# Patient Record
Sex: Female | Born: 1940
Health system: Southern US, Community
[De-identification: ages and names within clinical notes are randomized; demographics above are authoritative.]

## PROBLEM LIST (undated history)

## (undated) DIAGNOSIS — K760 Fatty (change of) liver, not elsewhere classified: Secondary | ICD-10-CM

## (undated) DIAGNOSIS — L409 Psoriasis, unspecified: Secondary | ICD-10-CM

## (undated) DIAGNOSIS — D229 Melanocytic nevi, unspecified: Secondary | ICD-10-CM

## (undated) DIAGNOSIS — I2699 Other pulmonary embolism without acute cor pulmonale: Secondary | ICD-10-CM

## (undated) DIAGNOSIS — N904 Leukoplakia of vulva: Secondary | ICD-10-CM

## (undated) DIAGNOSIS — E785 Hyperlipidemia, unspecified: Secondary | ICD-10-CM

## (undated) DIAGNOSIS — G4700 Insomnia, unspecified: Secondary | ICD-10-CM

## (undated) DIAGNOSIS — I82409 Acute embolism and thrombosis of unspecified deep veins of unspecified lower extremity: Secondary | ICD-10-CM

## (undated) DIAGNOSIS — F329 Major depressive disorder, single episode, unspecified: Secondary | ICD-10-CM

## (undated) DIAGNOSIS — E039 Hypothyroidism, unspecified: Secondary | ICD-10-CM

## (undated) DIAGNOSIS — L309 Dermatitis, unspecified: Secondary | ICD-10-CM

## (undated) DIAGNOSIS — E559 Vitamin D deficiency, unspecified: Secondary | ICD-10-CM

## (undated) DIAGNOSIS — E041 Nontoxic single thyroid nodule: Secondary | ICD-10-CM

## (undated) DIAGNOSIS — I10 Essential (primary) hypertension: Secondary | ICD-10-CM

## (undated) DIAGNOSIS — R7301 Impaired fasting glucose: Secondary | ICD-10-CM

## (undated) DIAGNOSIS — Z8371 Family history of colonic polyps: Secondary | ICD-10-CM

## (undated) DIAGNOSIS — M8580 Other specified disorders of bone density and structure, unspecified site: Secondary | ICD-10-CM

## (undated) DIAGNOSIS — Z973 Presence of spectacles and contact lenses: Secondary | ICD-10-CM

## (undated) DIAGNOSIS — Z83719 Family history of colon polyps, unspecified: Secondary | ICD-10-CM

## (undated) DIAGNOSIS — C4490 Unspecified malignant neoplasm of skin, unspecified: Secondary | ICD-10-CM

## (undated) DIAGNOSIS — C50919 Malignant neoplasm of unspecified site of unspecified female breast: Secondary | ICD-10-CM

## (undated) DIAGNOSIS — E079 Disorder of thyroid, unspecified: Secondary | ICD-10-CM

## (undated) DIAGNOSIS — C4491 Basal cell carcinoma of skin, unspecified: Secondary | ICD-10-CM

## (undated) HISTORY — DX: Melanocytic nevi, unspecified: D22.9

## (undated) HISTORY — DX: Major depressive disorder, single episode, unspecified: F32.9

## (undated) HISTORY — PX: COLONOSCOPY: SHX174

## (undated) HISTORY — DX: Leukoplakia of vulva: N90.4

## (undated) HISTORY — DX: Hyperlipidemia, unspecified: E78.5

## (undated) HISTORY — PX: TUBAL LIGATION: SHX77

## (undated) HISTORY — DX: Family history of colon polyps, unspecified: Z83.719

## (undated) HISTORY — DX: Insomnia, unspecified: G47.00

## (undated) HISTORY — DX: Basal cell carcinoma of skin, unspecified: C44.91

## (undated) HISTORY — DX: Nontoxic single thyroid nodule: E04.1

## (undated) HISTORY — DX: Malignant neoplasm of unspecified site of unspecified female breast: C50.919

## (undated) HISTORY — DX: Other specified disorders of bone density and structure, unspecified site: M85.80

## (undated) HISTORY — DX: Essential (primary) hypertension: I10

## (undated) HISTORY — DX: Acute embolism and thrombosis of unspecified deep veins of unspecified lower extremity: I82.409

## (undated) HISTORY — DX: Family history of colonic polyps: Z83.71

## (undated) HISTORY — DX: Disorder of thyroid, unspecified: E07.9

## (undated) HISTORY — DX: Impaired fasting glucose: R73.01

## (undated) HISTORY — DX: Dermatitis, unspecified: L30.9

## (undated) HISTORY — PX: BREAST SURGERY: SHX581

## (undated) HISTORY — DX: Vitamin D deficiency, unspecified: E55.9

## (undated) HISTORY — DX: Psoriasis, unspecified: L40.9

## (undated) HISTORY — DX: Unspecified malignant neoplasm of skin, unspecified: C44.90

## (undated) HISTORY — DX: Hypothyroidism, unspecified: E03.9

## (undated) HISTORY — DX: Other pulmonary embolism without acute cor pulmonale: I26.99

## (undated) HISTORY — DX: Fatty (change of) liver, not elsewhere classified: K76.0

---

## 1973-11-07 HISTORY — PX: WRIST FRACTURE SURGERY: SHX121

## 1996-11-07 HISTORY — PX: ANKLE FRACTURE SURGERY: SHX122

## 1996-11-07 HISTORY — PX: ABDOMINOPLASTY: SUR9

## 1997-11-07 DIAGNOSIS — I2699 Other pulmonary embolism without acute cor pulmonale: Secondary | ICD-10-CM

## 1997-11-07 HISTORY — DX: Other pulmonary embolism without acute cor pulmonale: I26.99

## 2000-08-25 ENCOUNTER — Encounter: Admission: RE | Admit: 2000-08-25 | Discharge: 2000-11-23 | Payer: Self-pay | Admitting: Family Medicine

## 2001-06-07 ENCOUNTER — Other Ambulatory Visit: Admission: RE | Admit: 2001-06-07 | Discharge: 2001-06-07 | Payer: Self-pay | Admitting: Family Medicine

## 2002-06-18 ENCOUNTER — Other Ambulatory Visit: Admission: RE | Admit: 2002-06-18 | Discharge: 2002-06-18 | Payer: Self-pay | Admitting: Family Medicine

## 2003-04-30 ENCOUNTER — Encounter: Admission: RE | Admit: 2003-04-30 | Discharge: 2003-07-29 | Payer: Self-pay | Admitting: Family Medicine

## 2003-06-23 ENCOUNTER — Other Ambulatory Visit: Admission: RE | Admit: 2003-06-23 | Discharge: 2003-06-23 | Payer: Self-pay | Admitting: Family Medicine

## 2003-12-04 ENCOUNTER — Encounter: Admission: RE | Admit: 2003-12-04 | Discharge: 2003-12-18 | Payer: Self-pay | Admitting: Family Medicine

## 2003-12-10 ENCOUNTER — Encounter: Admission: RE | Admit: 2003-12-10 | Discharge: 2004-03-09 | Payer: Self-pay | Admitting: Family Medicine

## 2004-01-29 ENCOUNTER — Encounter: Admission: RE | Admit: 2004-01-29 | Discharge: 2004-01-29 | Payer: Self-pay | Admitting: Family Medicine

## 2004-02-03 ENCOUNTER — Encounter: Admission: RE | Admit: 2004-02-03 | Discharge: 2004-02-03 | Payer: Self-pay | Admitting: Family Medicine

## 2004-02-12 ENCOUNTER — Other Ambulatory Visit: Admission: RE | Admit: 2004-02-12 | Discharge: 2004-02-12 | Payer: Self-pay | Admitting: Otolaryngology

## 2004-07-01 ENCOUNTER — Encounter: Admission: RE | Admit: 2004-07-01 | Discharge: 2004-07-01 | Payer: Self-pay | Admitting: Family Medicine

## 2004-08-19 ENCOUNTER — Encounter: Admission: RE | Admit: 2004-08-19 | Discharge: 2004-08-19 | Payer: Self-pay | Admitting: Otolaryngology

## 2004-08-19 ENCOUNTER — Other Ambulatory Visit: Admission: RE | Admit: 2004-08-19 | Discharge: 2004-08-19 | Payer: Self-pay | Admitting: Family Medicine

## 2005-01-13 ENCOUNTER — Ambulatory Visit (HOSPITAL_COMMUNITY): Admission: RE | Admit: 2005-01-13 | Discharge: 2005-01-13 | Payer: Self-pay | Admitting: Gastroenterology

## 2005-01-13 ENCOUNTER — Encounter (INDEPENDENT_AMBULATORY_CARE_PROVIDER_SITE_OTHER): Payer: Self-pay | Admitting: *Deleted

## 2005-08-23 ENCOUNTER — Other Ambulatory Visit: Admission: RE | Admit: 2005-08-23 | Discharge: 2005-08-23 | Payer: Self-pay | Admitting: Family Medicine

## 2005-09-21 ENCOUNTER — Encounter: Admission: RE | Admit: 2005-09-21 | Discharge: 2005-09-21 | Payer: Self-pay | Admitting: Family Medicine

## 2005-10-03 ENCOUNTER — Ambulatory Visit (HOSPITAL_COMMUNITY): Admission: RE | Admit: 2005-10-03 | Discharge: 2005-10-03 | Payer: Self-pay | Admitting: Otolaryngology

## 2006-09-22 ENCOUNTER — Encounter: Admission: RE | Admit: 2006-09-22 | Discharge: 2006-09-22 | Payer: Self-pay | Admitting: Family Medicine

## 2006-10-12 ENCOUNTER — Other Ambulatory Visit: Admission: RE | Admit: 2006-10-12 | Discharge: 2006-10-12 | Payer: Self-pay | Admitting: Family Medicine

## 2007-10-17 ENCOUNTER — Encounter: Admission: RE | Admit: 2007-10-17 | Discharge: 2007-10-17 | Payer: Self-pay | Admitting: Family Medicine

## 2008-10-21 ENCOUNTER — Encounter: Admission: RE | Admit: 2008-10-21 | Discharge: 2008-10-21 | Payer: Self-pay | Admitting: Family Medicine

## 2009-10-26 ENCOUNTER — Encounter: Admission: RE | Admit: 2009-10-26 | Discharge: 2009-10-26 | Payer: Self-pay | Admitting: Family Medicine

## 2009-12-04 ENCOUNTER — Encounter: Admission: RE | Admit: 2009-12-04 | Discharge: 2009-12-04 | Payer: Self-pay | Admitting: Family Medicine

## 2010-05-13 ENCOUNTER — Emergency Department (HOSPITAL_COMMUNITY): Admission: EM | Admit: 2010-05-13 | Discharge: 2010-05-13 | Payer: Self-pay | Admitting: Family Medicine

## 2010-10-27 ENCOUNTER — Encounter
Admission: RE | Admit: 2010-10-27 | Discharge: 2010-10-27 | Payer: Self-pay | Source: Home / Self Care | Attending: Family Medicine | Admitting: Family Medicine

## 2010-11-07 HISTORY — PX: EYE SURGERY: SHX253

## 2010-12-06 ENCOUNTER — Other Ambulatory Visit: Payer: Self-pay | Admitting: Family Medicine

## 2010-12-06 DIAGNOSIS — E041 Nontoxic single thyroid nodule: Secondary | ICD-10-CM

## 2010-12-09 ENCOUNTER — Ambulatory Visit
Admission: RE | Admit: 2010-12-09 | Discharge: 2010-12-09 | Disposition: A | Discharge status: Home / Self Care | Payer: MEDICARE | Source: Ambulatory Visit | Attending: Family Medicine | Admitting: Family Medicine

## 2010-12-09 DIAGNOSIS — E041 Nontoxic single thyroid nodule: Secondary | ICD-10-CM

## 2011-01-27 ENCOUNTER — Emergency Department (HOSPITAL_COMMUNITY): Payer: MEDICARE

## 2011-01-27 ENCOUNTER — Emergency Department (HOSPITAL_COMMUNITY)
Admission: EM | Admit: 2011-01-27 | Discharge: 2011-01-27 | Disposition: A | Discharge status: Home / Self Care | Payer: MEDICARE | Attending: Emergency Medicine | Admitting: Emergency Medicine

## 2011-01-27 DIAGNOSIS — E039 Hypothyroidism, unspecified: Secondary | ICD-10-CM | POA: Insufficient documentation

## 2011-01-27 DIAGNOSIS — M546 Pain in thoracic spine: Secondary | ICD-10-CM | POA: Insufficient documentation

## 2011-01-27 DIAGNOSIS — K219 Gastro-esophageal reflux disease without esophagitis: Secondary | ICD-10-CM | POA: Insufficient documentation

## 2011-01-27 DIAGNOSIS — F329 Major depressive disorder, single episode, unspecified: Secondary | ICD-10-CM | POA: Insufficient documentation

## 2011-01-27 DIAGNOSIS — F3289 Other specified depressive episodes: Secondary | ICD-10-CM | POA: Insufficient documentation

## 2011-01-27 DIAGNOSIS — R142 Eructation: Secondary | ICD-10-CM | POA: Insufficient documentation

## 2011-01-27 DIAGNOSIS — R6884 Jaw pain: Secondary | ICD-10-CM | POA: Insufficient documentation

## 2011-01-27 DIAGNOSIS — Z79899 Other long term (current) drug therapy: Secondary | ICD-10-CM | POA: Insufficient documentation

## 2011-01-27 DIAGNOSIS — R141 Gas pain: Secondary | ICD-10-CM | POA: Insufficient documentation

## 2011-01-27 DIAGNOSIS — R079 Chest pain, unspecified: Secondary | ICD-10-CM | POA: Insufficient documentation

## 2011-01-27 DIAGNOSIS — E785 Hyperlipidemia, unspecified: Secondary | ICD-10-CM | POA: Insufficient documentation

## 2011-01-27 DIAGNOSIS — F411 Generalized anxiety disorder: Secondary | ICD-10-CM | POA: Insufficient documentation

## 2011-01-27 DIAGNOSIS — I10 Essential (primary) hypertension: Secondary | ICD-10-CM | POA: Insufficient documentation

## 2011-01-27 DIAGNOSIS — M25519 Pain in unspecified shoulder: Secondary | ICD-10-CM | POA: Insufficient documentation

## 2011-01-27 LAB — COMPREHENSIVE METABOLIC PANEL
ALT: 89 U/L — ABNORMAL HIGH (ref 0–35)
AST: 64 U/L — ABNORMAL HIGH (ref 0–37)
Albumin: 4.3 g/dL (ref 3.5–5.2)
Alkaline Phosphatase: 68 U/L (ref 39–117)
BUN: 17 mg/dL (ref 6–23)
CO2: 26 mEq/L (ref 19–32)
Calcium: 9.2 mg/dL (ref 8.4–10.5)
Chloride: 108 mEq/L (ref 96–112)
Creatinine, Ser: 0.79 mg/dL (ref 0.4–1.2)
GFR calc Af Amer: >60 mL/min (ref >60)
GFR calc non Af Amer: >60 mL/min (ref >60)
Glucose, Bld: 108 mg/dL — ABNORMAL HIGH (ref 70–99)
Potassium: 4.4 mEq/L (ref 3.5–5.1)
Sodium: 141 mEq/L (ref 135–145)
Total Bilirubin: 0.2 mg/dL — ABNORMAL LOW (ref 0.3–1.2)
Total Protein: 7.3 g/dL (ref 6.0–8.3)

## 2011-01-27 LAB — DIFFERENTIAL
Basophils Absolute: 0.1 10*3/uL (ref 0.0–0.1)
Basophils Relative: 1 % (ref 0–1)
Eosinophils Absolute: 0.5 10*3/uL (ref 0.0–0.7)
Eosinophils Relative: 7 % — ABNORMAL HIGH (ref 0–5)
Lymphocytes Relative: 26 % (ref 12–46)
Lymphs Abs: 2.2 10*3/uL (ref 0.7–4.0)
Monocytes Absolute: 0.7 10*3/uL (ref 0.1–1.0)
Monocytes Relative: 8 % (ref 3–12)
Neutro Abs: 4.9 10*3/uL (ref 1.7–7.7)
Neutrophils Relative %: 59 % (ref 43–77)

## 2011-01-27 LAB — CBC
HCT: 40.4 % (ref 36.0–46.0)
Hemoglobin: 13.3 g/dL (ref 12.0–15.0)
MCH: 31.5 pg (ref 26.0–34.0)
MCHC: 32.9 g/dL (ref 30.0–36.0)
MCV: 95.7 fL (ref 78.0–100.0)
Platelets: 236 10*3/uL (ref 150–400)
RBC: 4.22 MIL/uL (ref 3.87–5.11)
RDW: 13.7 % (ref 11.5–15.5)
WBC: 8.3 10*3/uL (ref 4.0–10.5)

## 2011-01-27 LAB — POCT CARDIAC MARKERS
CKMB, poc: <1 ng/mL — ABNORMAL LOW (ref 1.0–8.0)
Myoglobin, poc: 52.2 ng/mL (ref 12–200)
Troponin i, poc: <0.05 ng/mL (ref 0.00–0.09)

## 2011-03-25 NOTE — Op Note (Signed)
NAME:  Jessica Bautista, Jessica Bautista NO.:  [id number]   MEDICAL RECORD NO.:  [id number]          PATIENT TYPE:  AMB   LOCATION:  ENDO                         FACILITY:  [facility]   PHYSICIAN:  John C. Madilyn Fireman, M.D.    DATE OF BIRTH:  08-Sep-1941   DATE OF PROCEDURE:  01/13/2005  DATE OF DISCHARGE:                                 OPERATIVE REPORT   PROCEDURE:  Colonoscopy.   ENDOSCOPIST:  Everardo All. Madilyn Fireman, M.D.   INDICATIONS FOR PROCEDURE:  Family history of colon cancer in first-degree  relatives.   PROCEDURE:  The patient placed in the left lateral decubitus position and  placed on pulse monitor with continuous low-flow oxygen delivered by nasal  cannula.  She was sedated with 100 mcg IV fentanyl and 8 milligrams IV  Versed.  The Olympus video colonoscope was inserted into the rectum and  advanced to the cecum, confirmed by transillumination of McBurney's point  and visualization of the ileocecal valve and appendiceal orifice. The prep  was excellent.  The cecum, ascending, transverse and the descending colon  all appeared normal with no masses, polyps, diverticula or other mucosal  abnormalities.  In the sigmoid colon, there was seen a 7- to 8-mm polyp at  28 cm which was removed by snare.  In the rectum, there was 5-mm polyp that  was fulgurated by hot biopsy.  The remainder of the rectum appeared normal.  There were 1 or 2 scattered diverticula in the area of the sigmoid colon.  Scope was then withdrawn and the patient returned to the recovery room in  stable condition.  She tolerated the procedure well and there were no  immediate complications.   IMPRESSION:  1.  Sigmoid and rectosigmoid polyps.  2.  Rare diverticula in the sigmoid colon.   PLAN:  Await histology and will repeat colonoscopy in 3-5 years.      [facility]/MEDQ  D:  01/13/2005  T:  01/13/2005  Job:  [id number]   cc:   Caryn Bee L. Little, M.D.  [address]  [place]  [place] [address]  Fax: [phone]

## 2011-09-23 ENCOUNTER — Other Ambulatory Visit: Payer: Self-pay | Admitting: Family Medicine

## 2011-09-23 DIAGNOSIS — Z1231 Encounter for screening mammogram for malignant neoplasm of breast: Secondary | ICD-10-CM

## 2011-11-04 ENCOUNTER — Ambulatory Visit
Admission: RE | Admit: 2011-11-04 | Discharge: 2011-11-04 | Disposition: A | Discharge status: Home / Self Care | Payer: Medicare Other | Source: Ambulatory Visit | Attending: Family Medicine | Admitting: Family Medicine

## 2011-11-04 DIAGNOSIS — Z1231 Encounter for screening mammogram for malignant neoplasm of breast: Secondary | ICD-10-CM

## 2012-05-16 ENCOUNTER — Other Ambulatory Visit: Payer: Self-pay | Admitting: Dermatology

## 2012-11-05 ENCOUNTER — Other Ambulatory Visit: Payer: Self-pay | Admitting: Family Medicine

## 2012-11-05 DIAGNOSIS — Z1231 Encounter for screening mammogram for malignant neoplasm of breast: Secondary | ICD-10-CM

## 2012-11-13 ENCOUNTER — Other Ambulatory Visit: Payer: Self-pay | Admitting: Family Medicine

## 2012-11-13 DIAGNOSIS — E041 Nontoxic single thyroid nodule: Secondary | ICD-10-CM

## 2012-11-15 ENCOUNTER — Other Ambulatory Visit: Payer: Medicare Other

## 2012-11-16 ENCOUNTER — Ambulatory Visit
Admission: RE | Admit: 2012-11-16 | Discharge: 2012-11-16 | Disposition: A | Discharge status: Home / Self Care | Payer: Medicare Other | Source: Ambulatory Visit | Attending: Family Medicine | Admitting: Family Medicine

## 2012-11-16 DIAGNOSIS — E041 Nontoxic single thyroid nodule: Secondary | ICD-10-CM

## 2012-12-06 ENCOUNTER — Ambulatory Visit
Admission: RE | Admit: 2012-12-06 | Discharge: 2012-12-06 | Disposition: A | Discharge status: Home / Self Care | Payer: Medicare Other | Source: Ambulatory Visit | Attending: Family Medicine | Admitting: Family Medicine

## 2012-12-06 DIAGNOSIS — Z1231 Encounter for screening mammogram for malignant neoplasm of breast: Secondary | ICD-10-CM

## 2013-05-23 ENCOUNTER — Other Ambulatory Visit: Payer: Self-pay | Admitting: Dermatology

## 2013-06-11 ENCOUNTER — Other Ambulatory Visit: Payer: Self-pay | Admitting: Dermatology

## 2013-11-05 ENCOUNTER — Other Ambulatory Visit: Payer: Self-pay

## 2013-11-05 DIAGNOSIS — Z1231 Encounter for screening mammogram for malignant neoplasm of breast: Secondary | ICD-10-CM

## 2013-11-14 ENCOUNTER — Other Ambulatory Visit: Payer: Self-pay | Admitting: Family Medicine

## 2013-11-14 DIAGNOSIS — E041 Nontoxic single thyroid nodule: Secondary | ICD-10-CM

## 2013-11-19 ENCOUNTER — Ambulatory Visit
Admission: RE | Admit: 2013-11-19 | Discharge: 2013-11-19 | Disposition: A | Discharge status: Home / Self Care | Payer: Medicare Other | Source: Ambulatory Visit | Attending: Family Medicine | Admitting: Family Medicine

## 2013-11-19 DIAGNOSIS — E041 Nontoxic single thyroid nodule: Secondary | ICD-10-CM

## 2013-12-10 ENCOUNTER — Ambulatory Visit
Admission: RE | Admit: 2013-12-10 | Discharge: 2013-12-10 | Disposition: A | Discharge status: Home / Self Care | Payer: Self-pay | Source: Ambulatory Visit

## 2013-12-10 DIAGNOSIS — Z1231 Encounter for screening mammogram for malignant neoplasm of breast: Secondary | ICD-10-CM

## 2013-12-12 ENCOUNTER — Other Ambulatory Visit: Payer: Self-pay | Admitting: Dermatology

## 2013-12-13 ENCOUNTER — Other Ambulatory Visit: Payer: Self-pay | Admitting: Family Medicine

## 2013-12-13 DIAGNOSIS — R928 Other abnormal and inconclusive findings on diagnostic imaging of breast: Secondary | ICD-10-CM

## 2013-12-25 ENCOUNTER — Other Ambulatory Visit: Payer: Medicare Other

## 2014-01-06 ENCOUNTER — Ambulatory Visit
Admission: RE | Admit: 2014-01-06 | Discharge: 2014-01-06 | Disposition: A | Discharge status: Home / Self Care | Payer: Medicare Other | Source: Ambulatory Visit | Attending: Family Medicine | Admitting: Family Medicine

## 2014-01-06 ENCOUNTER — Other Ambulatory Visit: Payer: Self-pay | Admitting: Family Medicine

## 2014-01-06 DIAGNOSIS — R928 Other abnormal and inconclusive findings on diagnostic imaging of breast: Secondary | ICD-10-CM

## 2014-01-08 ENCOUNTER — Other Ambulatory Visit: Payer: Self-pay | Admitting: Family Medicine

## 2014-01-08 ENCOUNTER — Ambulatory Visit
Admission: RE | Admit: 2014-01-08 | Discharge: 2014-01-08 | Disposition: A | Discharge status: Home / Self Care | Payer: Medicare Other | Source: Ambulatory Visit | Attending: Family Medicine | Admitting: Family Medicine

## 2014-01-08 DIAGNOSIS — R928 Other abnormal and inconclusive findings on diagnostic imaging of breast: Secondary | ICD-10-CM

## 2014-01-10 ENCOUNTER — Telehealth: Payer: Self-pay | Admitting: *Deleted

## 2014-01-10 DIAGNOSIS — Z170 Estrogen receptor positive status [ER+]: Secondary | ICD-10-CM

## 2014-01-10 DIAGNOSIS — C50411 Malignant neoplasm of upper-outer quadrant of right female breast: Secondary | ICD-10-CM

## 2014-01-10 NOTE — Telephone Encounter (Signed)
Confirmed [facility] for 01/15/14 at 12N .  Instructions and contact information given.

## 2014-01-15 ENCOUNTER — Ambulatory Visit (HOSPITAL_BASED_OUTPATIENT_CLINIC_OR_DEPARTMENT_OTHER): Payer: Medicare Other | Admitting: Oncology

## 2014-01-15 ENCOUNTER — Encounter: Payer: Self-pay | Admitting: Oncology

## 2014-01-15 ENCOUNTER — Ambulatory Visit: Payer: Medicare Other | Admitting: Physical Therapy

## 2014-01-15 ENCOUNTER — Telehealth: Payer: Self-pay | Admitting: Oncology

## 2014-01-15 ENCOUNTER — Other Ambulatory Visit (HOSPITAL_BASED_OUTPATIENT_CLINIC_OR_DEPARTMENT_OTHER): Payer: Medicare Other

## 2014-01-15 ENCOUNTER — Encounter: Payer: Self-pay | Admitting: Radiation Oncology

## 2014-01-15 ENCOUNTER — Ambulatory Visit (HOSPITAL_BASED_OUTPATIENT_CLINIC_OR_DEPARTMENT_OTHER): Payer: Medicare Other

## 2014-01-15 ENCOUNTER — Encounter (INDEPENDENT_AMBULATORY_CARE_PROVIDER_SITE_OTHER): Payer: Self-pay

## 2014-01-15 ENCOUNTER — Ambulatory Visit (HOSPITAL_BASED_OUTPATIENT_CLINIC_OR_DEPARTMENT_OTHER): Payer: Medicare Other | Admitting: General Surgery

## 2014-01-15 ENCOUNTER — Ambulatory Visit
Admission: RE | Admit: 2014-01-15 | Discharge: 2014-01-15 | Disposition: A | Discharge status: Home / Self Care | Payer: Medicare Other | Source: Ambulatory Visit | Attending: Radiation Oncology | Admitting: Radiation Oncology

## 2014-01-15 VITALS — BP 136/81 | HR 65 | Temp 98.2°F | Resp 20 | Ht 64.0 in | Wt 204.8 lb

## 2014-01-15 DIAGNOSIS — C50411 Malignant neoplasm of upper-outer quadrant of right female breast: Secondary | ICD-10-CM

## 2014-01-15 DIAGNOSIS — C50419 Malignant neoplasm of upper-outer quadrant of unspecified female breast: Secondary | ICD-10-CM

## 2014-01-15 DIAGNOSIS — Z170 Estrogen receptor positive status [ER+]: Secondary | ICD-10-CM

## 2014-01-15 LAB — COMPREHENSIVE METABOLIC PANEL (CC13)
ALT: 53 U/L (ref 0–55)
ANION GAP: 8 meq/L (ref 3–11)
AST: 40 U/L — AB (ref 5–34)
Albumin: 3.9 g/dL (ref 3.5–5.0)
Alkaline Phosphatase: 76 U/L (ref 40–150)
BUN: 11.4 mg/dL (ref 7.0–26.0)
CO2: 27 meq/L (ref 22–29)
CREATININE: 0.7 mg/dL (ref 0.6–1.1)
Calcium: 9.2 mg/dL (ref 8.4–10.4)
Chloride: 107 mEq/L (ref 98–109)
Glucose: 121 mg/dl (ref 70–140)
Potassium: 4.5 mEq/L (ref 3.5–5.1)
SODIUM: 142 meq/L (ref 136–145)
TOTAL PROTEIN: 6.8 g/dL (ref 6.4–8.3)
Total Bilirubin: 0.46 mg/dL (ref 0.20–1.20)

## 2014-01-15 LAB — CBC WITH DIFFERENTIAL/PLATELET
BASO%: 1.1 % (ref 0.0–2.0)
Basophils Absolute: 0.1 10*3/uL (ref 0.0–0.1)
EOS%: 5.4 % (ref 0.0–7.0)
Eosinophils Absolute: 0.4 10*3/uL (ref 0.0–0.5)
HCT: 37.5 % (ref 34.8–46.6)
HGB: 12.4 g/dL (ref 11.6–15.9)
LYMPH#: 1.9 10*3/uL (ref 0.9–3.3)
LYMPH%: 29 % (ref 14.0–49.7)
MCH: 31.4 pg (ref 25.1–34.0)
MCHC: 33.1 g/dL (ref 31.5–36.0)
MCV: 94.7 fL (ref 79.5–101.0)
MONO#: 0.5 10*3/uL (ref 0.1–0.9)
MONO%: 7.3 % (ref 0.0–14.0)
NEUT#: 3.7 10*3/uL (ref 1.5–6.5)
NEUT%: 57.2 % (ref 38.4–76.8)
Platelets: 243 10*3/uL (ref 145–400)
RBC: 3.96 10*6/uL (ref 3.70–5.45)
RDW: 13 % (ref 11.2–14.5)
WBC: 6.5 10*3/uL (ref 3.9–10.3)

## 2014-01-15 NOTE — Progress Notes (Signed)
Checked in new patient with no financial issues. She has appt card and I gave her [facility] packet. She has not been out of country.

## 2014-01-15 NOTE — Progress Notes (Signed)
Chief Complaint: New diagnosis of breast cancer  History:    Jessica Bautista is a 72 y.o. postmenopausal female referred by Dr. Eagle  for evaluation of recently diagnosed carcinoma of the right breast. She recently presented for a screening mamogram revealing a possible small mass in the right breast..  Subsequent imaging included diagnostic mamogram showing an irregular 8 mm mass in the lateral aspect of the right breast and ultrasound showing 6 mm irregular hypoechoic mass at the 9:30 position 6 cm from the nipple..   A ultrasound guided biopsy was performed on 01/08/2014 with pathology revealing invasive mammary carcinoma with papillary features.  She is seen now in multidisciplinary breast clinic for initial treatment planning.  She has experienced no breast symptoms specifically mass or nipple discharge or skin changes.  She does not have a personal history of any previous breast problems.  Findings at that time were the following:  Tumor size: 0.6 cm  Tumor grade: 2  Estrogen Receptor: positive Progesterone Receptor: positive  Her-2 neu: negative  Lymph node status: negative Neurovascular invasion: no Lymphatic invasion: no  Past Medical History  Diagnosis Date  . Hypertension   . Thyroid disease   . Skin cancer   . Hyperlipidemia   . Psoriasis   . Pulmonary embolus     Past Surgical History  Procedure Laterality Date  . Ankle fracture surgery    . Wrist fracture surgery    . Abdominoplasty      Current Outpatient Prescriptions  Medication Sig Dispense Refill  . escitalopram (LEXAPRO) 10 MG tablet Take 10 mg by mouth daily.       . lisinopril (PRINIVIL,ZESTRIL) 10 MG tablet Take 10 mg by mouth daily.      . pravastatin (PRAVACHOL) 40 MG tablet Take 20 mg by mouth daily.       . SYNTHROID 112 MCG tablet Take 112 mcg by mouth daily before breakfast.       . zolpidem (AMBIEN) 10 MG tablet 10 mg at bedtime as needed.        No current facility-administered medications for  this visit.    No family history on file.  History   Social History  . Marital Status: Married    Spouse Name: N/A    Number of Children: N/A  . Years of Education: N/A   Social History Main Topics  . Smoking status: Former Smoker -- 1.00 packs/day    Types: Cigarettes  . Smokeless tobacco: Not on file  . Alcohol Use: 1.8 oz/week    3 Glasses of wine per week  . Drug Use: No  . Sexual Activity: Not on file   Other Topics Concern  . Not on file   Social History Narrative  . No narrative on file     Review of Systems Constitutional: negative Respiratory: negative Cardiovascular: negative Gastrointestinal: negative, colonoscopy is up-to-date Hematologic/lymphatic: negative except for history of pulmonary embolus 18 years ago following an abdominoplasty     Objective:  There were no vitals taken for this visit.  General: Alert, moderately obese Caucasian female who appears younger than her stated age, in no distress Skin: Warm and dry without rash or infection. HEENT: No palpable masses or thyromegaly. Sclera nonicteric. Pupils equal round and reactive. Oropharynx clear. Breasts: slight bruising in thickening lateral right breast likely secondary to biopsy. No other palpable masses or skin change or nipple discharge in either breast Lymph nodes: No cervical, supraclavicular, or inguinal nodes palpable. Lungs: Breath sounds clear and   equal without increased work of breathing Cardiovascular: Regular rate and rhythm without murmur. No JVD or edema. Peripheral pulses intact. Abdomen: Nondistended. Soft and nontender. No masses palpable. No organomegaly. No palpable hernias. Extremities: No edema or joint swelling or deformity. No chronic venous stasis changes. Neurologic: Alert and fully oriented. Gait normal.   Laboratory data:  CBC:  Lab Results  Component Value Date   WBC 6.5 01/15/2014   WBC 8.3 01/27/2011   RBC 3.96 01/15/2014   RBC 4.22 01/27/2011   HGB 12.4  01/15/2014   HGB 13.3 01/27/2011   HCT 37.5 01/15/2014   HCT 40.4 01/27/2011   PLT 243 01/15/2014   PLT 236 01/27/2011  ]  [facility] Labs:  Lab Results  Component Value Date   NA 142 01/15/2014   NA 141 01/27/2011   K 4.5 01/15/2014   K 4.4 01/27/2011   CL 108 01/27/2011   CO2 27 01/15/2014   CO2 26 01/27/2011   BUN 11.4 01/15/2014   BUN 17 01/27/2011   CREATININE 0.7 01/15/2014   CREATININE 0.79 01/27/2011   CALCIUM 9.2 01/15/2014   CALCIUM 9.2 01/27/2011   PROT 6.8 01/15/2014   PROT 7.3 01/27/2011   BILITOT 0.46 01/15/2014   BILITOT 0.2* 01/27/2011   ALKPHOS 76 01/15/2014   ALKPHOS 68 01/27/2011   AST 40* 01/15/2014   AST 64* 01/27/2011   ALT 53 01/15/2014   ALT 89* 01/27/2011     Assessment  73 y.o. female with a new diagnosis of cancer of the the right breast upper outer quadrant.  Clinical IA, estrogen receptor positive, progesterone receptor positive and Her2/Neu protein/oncogene negative. I discussed with the patient and family members present today initial surgical treatment options. We discussed options of breast conservation with lumpectomy or total mastectomy and sentinal lymph node biopsy/dissection. Options for reconstruction were discussed. After discussion they have elected to proceed with right partial mastectomy.  After discussion with medical and radiation oncology and they feel that treatment would not be altered issue her lymph node positive. Current plan would be for postoperative hormonal treatment only. We discussed that sentinel lymph node biopsy would provide only prognostic information and not change her treatment and is very likely to be negative and she first sentinel lymph node biopsy which is our recommendation. We discussed the indications and nature of the procedure, and expected recovery, in detail. Surgical risks including anesthetic complications, cardiorespiratory complications, bleeding, infection, wound healing complications, blood clots, l  local and distant recurrence and  possible need for further surgery based on the final pathology was discussed and understood.  Chemotherapy, hormonal therapy and radiation therapy have been discussed. They have been provided with literature regarding the treatment of breast cancer.  All questions were answered. They understand and agree to proceed and we will go ahead with scheduling.  Plan right partial mastectomy needle localization as an outpatient under general anesthesia  Edward Jolly MD, FACS  01/15/2014, 3:47 PM

## 2014-01-15 NOTE — Progress Notes (Signed)
[place] Psychosocial Distress Screening Clinical Social Work  [place] representative Intern met Patient at breast clinic.  The patient scored a 4 on the Psychosocial Distress Thermometer which indicates mild distress. Clinical Social Worker Intern spoke with to assess for distress and other psychosocial needs. Patient was made aware of programs and services and provided with written information.  Patient had no further concerns but agrees to contact [facility] with further questions or concerns.   Clinical Social Worker follow up needed: no  If yes, follow up plan:   Kala Ambriz S. [place] Work Intern [employer] [phone]

## 2014-01-15 NOTE — Telephone Encounter (Signed)
, °

## 2014-01-15 NOTE — Progress Notes (Signed)
Jessica Bautista  [phone] [phone]     ID: Jessica Bautista OB: July 15, 1941  MR#: [id number]  KXF#:[id number]  PCP: Gennette Pac, MD GYN:   SU: Excell Seltzer OTHER MD: Eppie Gibson  CHIEF COMPLAINT: "My mammogram showed breast cancer"  BREAST CANCER HISTORY: Jessica Bautista had routine bilateral screening mammography at the breast Center to 01/24/2014. A possible mass in the right breast was noted, and she was recalled for a right diagnostic mammogram and ultrasound 01-2014. This showed an irregular 8 mm mass in the lateral right breast, with a second more circumscribed 7 mm mass inferiorly, which was stable. There was no palpable mass by exam. Ultrasound showed a 7 mm irregular hypoechoic mass in the right breast upper outer quadrant. The second area noted on mammography was a simple cyst. There was no right axillary adenopathy.  Biopsy of the questionable mass 01/08/2014 showed ([id number]) an invasive ductal carcinoma with papillary features, grade 2estrogen and progesterone receptor positive, with an MIB-1 of 15 and no HER-2 amplification, the signals ratio being 1.19 in the number per cell 1.55.   The patient's subsequent history is as detailed below  INTERVAL HISTORY: Jessica Bautista was evaluated in the multidisciplinary breast cancer clinic 01/15/2014. Her case was also discussed at conference that same morning.  REVIEW OF SYSTEMS: There were no specific symptoms leading to the original mammogram, which was routinely scheduled. The patient denies unusual headaches, visual changes, nausea, vomiting, stiff neck, dizziness, or gait imbalance. There has been no cough, phlegm production, or pleurisy, no chest pain or pressure, and no change in bowel or bladder habits. The patient denies fever, rash, bleeding, unexplained fatigue or unexplained weight loss. She does have a history of basal cell carcinomas, one recently removed by Dr. Sarajane Jews, as well as psoriasis,  chiefly involving the posterior scalp area. She has seasonal sinus allergies. Sometimes she feels forgetful, but denies anxiety or depression. A detailed review of systems was otherwise entirely negative.  PAST MEDICAL HISTORY: Past Medical History  Diagnosis Date  . Hypertension   . Thyroid disease   . Skin cancer   . Hyperlipidemia   . Psoriasis   . Pulmonary embolus     PAST SURGICAL HISTORY: Past Surgical History  Procedure Laterality Date  . Ankle fracture surgery    . Wrist fracture surgery    . Abdominoplasty      FAMILY HISTORY History reviewed. No pertinent family history. The patient's father died at the age of 4 with multiple comorbidities including diabetes heart disease and COPD. The patient's mother died at the age of 37. The patient had 3 brothers, one of whom died from lung cancer at the age of 44. He was a smoker. She had 2 sisters. There is no history of breast or ovarian cancer in the family.  GYNECOLOGIC HISTORY:  Menarche age 72, first live birth age 63, [name] P5. She stopped having periods "about 20 years ago". She did not take hormone replacement. 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 Her husband Jessica Bautista, is currently 6 years old. "He mostly stays at home and watch a sports". Son been lives in [place] and is in pacemaker sales. Daughter Jessica Bautista lives in [place] where she works for a Herbalist areas. Daughter Jessica Bautista lives in [place] and is an Glass blower/designer. Son Jessica Bautista lives in [place]  and is a Probation officer. Son Jessica Bautista also lives in [place], and works in plumbing. The patient has multiple grandchildren. She attends [employer]    ADVANCED DIRECTIVES: In place   HEALTH MAINTENANCE: History  Substance Use Topics  . Smoking status: Former Smoker -- 1.00 packs/day    Types: Cigarettes  .  Smokeless tobacco: Not on file  . Alcohol Use: 1.8 oz/week    3 Glasses of wine per week     Colonoscopy:  PAP:  Bone density: 11/18/2013 at [facility], lowest T score of -0.7 (normal) at the right femoral neck  Lipid panel:  No Known Allergies  No current outpatient prescriptions on file.   No current facility-administered medications for this visit.    OBJECTIVE: Middle-aged white woman in no acute distress Filed Vitals:   01/15/14 1251  BP: 136/81  Pulse: 65  Temp: 98.2 F (36.8 C)  Resp: 20     Body mass index is 35.14 kg/(m^2).    ECOG FS:1 - Symptomatic but completely ambulatory  Ocular: Sclerae unicteric, pupils equal, round and reactive to light Ear-nose-throat: Oropharynx clear, dentition in good repair Lymphatic: No cervical or supraclavicular adenopathy Lungs no rales or rhonchi, good excursion bilaterally Heart regular rate and rhythm, no murmur appreciated Abd soft, nontender, positive bowel sounds MSK no focal spinal tenderness, no joint edema Neuro: non-focal, well-oriented, appropriate affect Breasts: The right breast is status post recent biopsy. There is a moderate ecchymosis associated with this. There is no palpable mass. There is no skin or nipple change of concern. The right axilla is benign. The left breast is unremarkable   LAB RESULTS:  CMP     Component Value Date/Time   NA 142 01/15/2014 1221   NA 141 01/27/2011 1923   K 4.5 01/15/2014 1221   K 4.4 01/27/2011 1923   CL 108 01/27/2011 1923   CO2 27 01/15/2014 1221   CO2 26 01/27/2011 1923   GLUCOSE 121 01/15/2014 1221   GLUCOSE 108* 01/27/2011 1923   BUN 11.4 01/15/2014 1221   BUN 17 01/27/2011 1923   CREATININE 0.7 01/15/2014 1221   CREATININE 0.79 01/27/2011 1923   CALCIUM 9.2 01/15/2014 1221   CALCIUM 9.2 01/27/2011 1923   PROT 6.8 01/15/2014 1221   PROT 7.3 01/27/2011 1923   ALBUMIN 3.9 01/15/2014 1221   ALBUMIN 4.3 01/27/2011 1923   AST 40* 01/15/2014 1221   AST 64* 01/27/2011 1923   ALT  53 01/15/2014 1221   ALT 89* 01/27/2011 1923   ALKPHOS 76 01/15/2014 1221   ALKPHOS 68 01/27/2011 1923   BILITOT 0.46 01/15/2014 1221   BILITOT 0.2* 01/27/2011 1923   GFRNONAA >60 01/27/2011 1923   GFRAA  Value: >60        The eGFR has been calculated using the MDRD equation. This calculation has not been validated in all clinical situations. eGFR's persistently <60 mL/min signify possible Chronic Kidney Disease. 01/27/2011 1923    I No results found for this basename: SPEP, UPEP,  kappa and lambda light chains    Lab Results  Component Value Date   WBC 6.5 01/15/2014   NEUTROABS 3.7 01/15/2014   HGB 12.4 01/15/2014   HCT 37.5 01/15/2014   MCV 94.7 01/15/2014   PLT 243 01/15/2014      Chemistry      Component Value Date/Time   NA 142 01/15/2014 1221   NA 141 01/27/2011 1923   K 4.5 01/15/2014 1221   K 4.4 01/27/2011 1923  CL 108 01/27/2011 1923   CO2 27 01/15/2014 1221   CO2 26 01/27/2011 1923   BUN 11.4 01/15/2014 1221   BUN 17 01/27/2011 1923   CREATININE 0.7 01/15/2014 1221   CREATININE 0.79 01/27/2011 1923      Component Value Date/Time   CALCIUM 9.2 01/15/2014 1221   CALCIUM 9.2 01/27/2011 1923   ALKPHOS 76 01/15/2014 1221   ALKPHOS 68 01/27/2011 1923   AST 40* 01/15/2014 1221   AST 64* 01/27/2011 1923   ALT 53 01/15/2014 1221   ALT 89* 01/27/2011 1923   BILITOT 0.46 01/15/2014 1221   BILITOT 0.2* 01/27/2011 1923       No results found for this basename: LABCA2    No components found with this basename: LABCA125    No results found for this basename: INR,  in the last 168 hours  Urinalysis No results found for this basename: colorurine, appearanceur, labspec, phurine, glucoseu, hgbur, bilirubinur, ketonesur, proteinur, urobilinogen, nitrite, leukocytesur    STUDIES: Mm Digital Diagnostic Unilat R  01/08/2014   CLINICAL DATA:  Ultrasound-guided core needle biopsy of a mass at 9:30 o'clock, 7 cm from the right nipple with clip placement.  EXAM: POST-BIOPSY CLIP PLACEMENT RIGHT  DIAGNOSTIC MAMMOGRAM  COMPARISON:  Previous exams.  FINDINGS: Films are performed following ultrasound guided biopsy of a mass at 9:30 o'clock 7 cm from the right nipple. The ribbon clip is in the expected location of the nodule. The mass is obscured by a small hematoma.  IMPRESSION: Appropriate clip placement following ultrasound-guided core needle biopsy of a mass at 9:30 o'clock, 7 cm from the right nipple. The mass is obscured by a small hematoma.  Final Assessment: Post Procedure Mammograms for Marker Placement   Electronically Signed   By: Ulyess Blossom M.D.   On: 01/08/2014 10:42   Mm Digital Diagnostic Unilat R  01/06/2014   CLINICAL DATA:  Patient recalled from screening for right breast mass.  EXAM: DIGITAL DIAGNOSTIC  RIGHT MAMMOGRAM  ULTRASOUND RIGHT BREAST  COMPARISON:  [facility]  ACR Breast Density Category b: There are scattered areas of fibroglandular density.  FINDINGS: Spot compression CC and MLO views demonstrate a persistent irregular 8 mm mass within the lateral aspect of the right breast middle to posterior depth. Slightly inferior lateral to this irregular mass is an adjacent circumscribed 7 mm mass which is stable compared to multiple priors.  On physical exam, left palpate no discrete mass within the lateral aspect of the right breast.  Ultrasound is performed, showing a 0.6 x 0.7 x 0.4 cm irregular hypoechoic mass within the right breast 930 o'clock 6 cm from the nipple. Additionally there is a simple cyst within the right breast 9 o'clock position 7 cm from the nipple. No right axillary lymphadenopathy.  IMPRESSION: Suspicious mass within the right breast 930 o'clock. Ultrasound-guided core needle biopsy is recommended.  RECOMMENDATION: Ultrasound-guided core needle biopsy suspicious right breast mass. This is scheduled for 01/08/2014 at 9 a.m.  I have discussed the findings and recommendations with the patient. Results were also provided in writing at the conclusion of the visit. If  applicable, a reminder letter will be sent to the patient regarding the next appointment.  BI-RADS CATEGORY  4: Suspicious abnormality - biopsy should be considered.   Electronically Signed   By: Lovey Newcomer M.D.   On: 01/06/2014 11:52   US Breast Ltd Uni Right Inc Axilla  01/06/2014   CLINICAL DATA:  Patient recalled from screening for right breast mass.  EXAM: DIGITAL DIAGNOSTIC  RIGHT MAMMOGRAM  ULTRASOUND RIGHT BREAST  COMPARISON:  [facility]  ACR Breast Density Category b: There are scattered areas of fibroglandular density.  FINDINGS: Spot compression CC and MLO views demonstrate a persistent irregular 8 mm mass within the lateral aspect of the right breast middle to posterior depth. Slightly inferior lateral to this irregular mass is an adjacent circumscribed 7 mm mass which is stable compared to multiple priors.  On physical exam, left palpate no discrete mass within the lateral aspect of the right breast.  Ultrasound is performed, showing a 0.6 x 0.7 x 0.4 cm irregular hypoechoic mass within the right breast 930 o'clock 6 cm from the nipple. Additionally there is a simple cyst within the right breast 9 o'clock position 7 cm from the nipple. No right axillary lymphadenopathy.  IMPRESSION: Suspicious mass within the right breast 930 o'clock. Ultrasound-guided core needle biopsy is recommended.  RECOMMENDATION: Ultrasound-guided core needle biopsy suspicious right breast mass. This is scheduled for 01/08/2014 at 9 a.m.  I have discussed the findings and recommendations with the patient. Results were also provided in writing at the conclusion of the visit. If applicable, a reminder letter will be sent to the patient regarding the next appointment.  BI-RADS CATEGORY  4: Suspicious abnormality - biopsy should be considered.   Electronically Signed   By: Lovey Newcomer M.D.   On: 01/06/2014 11:52   [place] Rt Breast Bx W Loc Dev 1st Lesion Img Bx Spec US Guide  01/09/2014   ADDENDUM REPORT: 01/09/2014 11:42  ADDENDUM:  Pathologic results indicate invasive mammary carcinoma with papillary features. This is concordant with the imaging findings. The patient has a pending appointment with the multidisciplinary Clinic on January 15, 2014. I gave these results and recommendations to the patient by telephone at 11:30 a.m. on 01/09/2014. The patient expressed moderate tenderness with no significant bruising related to the biopsy.   Electronically Signed   By: Skipper Cliche M.D.   On: 01/09/2014 11:42   01/09/2014   CLINICAL DATA:  Oval mass at 9:30 o'clock, 7 cm from the right nipple.  EXAM: ULTRASOUND GUIDED RIGHT BREAST CORE NEEDLE BIOPSY WITH VACUUM ASSISTANCE  COMPARISON:  Previous exams.  PROCEDURE: I met with the patient and we discussed the procedure of ultrasound-guided biopsy, including benefits and alternatives. We discussed the high likelihood of a successful procedure. We discussed the risks of the procedure including infection, bleeding, tissue injury, clip migration, and inadequate sampling. Informed written consent was given. The usual time-out protocol was performed immediately prior to the procedure.  Using sterile technique, 2% Lidocaine as local anesthetic, and direct ultrasound visualization, a 12 gauge vacuum-assisteddevice was used to perform core needle biopsy of the mass at 9:30 o'clock 7 cm from the right nipple using a caudocranial approach. At the conclusion of the procedure, a ribbon tissue marker clip was deployed into the biopsy cavity. Follow-up 2-view mammogram was performed and dictated separately.  IMPRESSION: Ultrasound-guided biopsy of an oval mass at 9:30 o'clock, 7 cm from the right nipple. No apparent complications.  Electronically Signed: By: Ulyess Blossom M.D. On: 01/08/2014 10:09    ASSESSMENT: 73 y.o. [place] woman status post right breast biopsy 01/08/2014 for a clinical T1b N0, stage IA invasive ductal carcinoma, grade 2, estrogen and progesterone receptor positive, with an MIB-1 of  15% and no HER-2 amplification  PLAN: We spent the better part of today's hour-long appointment discussing the biology of breast cancer in general, and the specifics of the  patient's tumor in particular. Jessica Bautista is in good shape for a woman of 62, but nevertheless in her situation and the benefit of chemotherapy, even if she had a positive lymph node, would be so low that I would be comfortable foregoing chemotherapy in her case. For that reason I do not feel she needs a sentinel lymph node or an Oncotype.  We also discussed radiation issues. We went over the data that shows that women in her situation who've takeanti-estrogens can safely forego radiation without any compromise and survival. There is only a minimal increase in the risk of local recurrence.  Once she understood that data she felt comfortable with no sentinel lymph node sampling (which minimizes the risk of lymphedema) and likely no post lumpectomy radiation assuming we get good margins from her surgery.   We then discussed the possible toxicities, side effects and complications of anti-estrogens. She understands it may take several months for her to develop significant side effects which might lead her to eventually not to take anti-estrogens, and if 5 or 6 months pass postop and she then decides not to take anti-estrogens it will be "too late" for radiation and likely we would simply proceed to observation from that point.  However she is quite committed to take anti-estrogens and very likely we will start with anastrozole, given that she has a normal baseline bone density. She is going to see me in approximately one month at which time we will review the final pathology from her surgery and make a final decision regarding radiation and antiestrogen therapy.  Jessica Bautista has a good understanding of this plan, and agrees with it. She knows the goal of treatment in her cases cure. She will call with any problems that may develop before next visit  here.   Chauncey Cruel, MD   01/15/2014 3:07 PM

## 2014-01-15 NOTE — Progress Notes (Signed)
Radiation Oncology         [phone] ________________________________  Initial outpatient Consultation  Name: SHAMEKIA TIPPETS MRN: [id number]  Date: 01/15/2014  DOB: Feb 14, 1941  PJ:SUNHRV,ACQPE Juel Burrow, MD  Hoxworth, Lorne Skeens, MD   REFERRING PHYSICIAN: Mariella Saa, MD  DIAGNOSIS: [id number] Right breast Invasive Mammary Carcinoma, ER 100%/PR 100% +, Her2(-), Ki67 19%, Grade 2  HISTORY OF PRESENT ILLNESS::Astin Kathie Rhodes Laury is a 73 y.o. female who presented with a right breast mass on screening mammography.   This was in the 9:30 position on [place] and measured 31mm.  Biopsy revealed Invasive Mammary Carcinoma, ER 100%/PR 100% +, Her2(-), Ki67 19%, Grade 2.    She is otherwise in her usual state of health.  She had Mohs surgery for skin cancer of her face this morning.   PREVIOUS RADIATION THERAPY: No  PAST MEDICAL HISTORY:  has a past medical history of Hypertension; Thyroid disease; Skin cancer; Hyperlipidemia; Psoriasis; and Pulmonary embolus.    PAST SURGICAL HISTORY: Past Surgical History  Procedure Laterality Date  . Ankle fracture surgery    . Wrist fracture surgery    . Abdominoplasty      FAMILY HISTORY: family history is not on file.  SOCIAL HISTORY:  reports that she has quit smoking. Her smoking use included Cigarettes. She smoked 1.00 pack per day. She does not have any smokeless tobacco history on file. She reports that she drinks about 1.8 ounces of alcohol per week. She reports that she does not use illicit drugs.  ALLERGIES: Review of patient's allergies indicates no known allergies.  MEDICATIONS:  Current Outpatient Prescriptions  Medication Sig Dispense Refill  . escitalopram (LEXAPRO) 10 MG tablet Take 10 mg by mouth daily.       Marland Kitchen lisinopril (PRINIVIL,ZESTRIL) 10 MG tablet Take 10 mg by mouth daily.      . pravastatin (PRAVACHOL) 40 MG tablet Take 20 mg by mouth daily.       Marland Kitchen SYNTHROID 112 MCG tablet Take 112 mcg by mouth daily before breakfast.         . zolpidem (AMBIEN) 10 MG tablet 10 mg at bedtime as needed.        No current facility-administered medications for this encounter.    REVIEW OF SYSTEMS:  Notable for that above.   PHYSICAL EXAM Vitals with Age-Percentiles 01/15/2014  Length 162.6 cm  Systolic 136  Diastolic 81  Pulse 65  Respiration 20  Weight 92.897 kg  BMI 35.2  VISIT REPORT     General: Alert and oriented, in no acute distress. Well-appearing. Neck: Neck is supple, no palpable cervical or supraclavicular lymphadenopathy. Heart: Regular in rate and rhythm with no murmurs, rubs, or gallops. Chest: Clear to auscultation bilaterally, with no rhonchi, wheezes, or rales. Abdomen: Soft, nontender, nondistended, with no rigidity or guarding. Lymphatics: No concerning lymphadenopathy. Skin: bandage over forehead from Mohs Neurologic: Cranial nerves II through XII are grossly intact. No obvious focalities. Speech is fluent. Coordination is intact. Psychiatric: Judgment and insight are intact. Affect is appropriate. Breasts: post biopsy tenderness/ecchymoses in right breast, otherwise unremarkable breast exam bilaterally without any palpable axillary nodes  ECOG = 0  0 - Asymptomatic (Fully active, able to carry on all predisease activities without restriction)  1 - Symptomatic but completely ambulatory (Restricted in physically strenuous activity but ambulatory and able to carry out work of a light or sedentary nature. For example, light housework, office work)  2 - Symptomatic, <50% in bed during the day (Ambulatory and  capable of all self care but unable to carry out any work activities. Up and about more than 50% of waking hours)  3 - Symptomatic, >50% in bed, but not bedbound (Capable of only limited self-care, confined to bed or chair 50% or more of waking hours)  4 - Bedbound (Completely disabled. Cannot carry on any self-care. Totally confined to bed or chair)  5 - Death   Santiago Glad MM, Creech RH, Tormey DC,  et al. [phone]). "Toxicity and response criteria of the [facility] Group". Am. Evlyn Clines. Oncol. 5 (6): 649-55   LABORATORY DATA:  Lab Results  Component Value Date   WBC 6.5 01/15/2014   HGB 12.4 01/15/2014   HCT 37.5 01/15/2014   MCV 94.7 01/15/2014   PLT 243 01/15/2014   CMP     Component Value Date/Time   NA 142 01/15/2014 1221   NA 141 01/27/2011 1923   K 4.5 01/15/2014 1221   K 4.4 01/27/2011 1923   CL 108 01/27/2011 1923   CO2 27 01/15/2014 1221   CO2 26 01/27/2011 1923   GLUCOSE 121 01/15/2014 1221   GLUCOSE 108* 01/27/2011 1923   BUN 11.4 01/15/2014 1221   BUN 17 01/27/2011 1923   CREATININE 0.7 01/15/2014 1221   CREATININE 0.79 01/27/2011 1923   CALCIUM 9.2 01/15/2014 1221   CALCIUM 9.2 01/27/2011 1923   PROT 6.8 01/15/2014 1221   PROT 7.3 01/27/2011 1923   ALBUMIN 3.9 01/15/2014 1221   ALBUMIN 4.3 01/27/2011 1923   AST 40* 01/15/2014 1221   AST 64* 01/27/2011 1923   ALT 53 01/15/2014 1221   ALT 89* 01/27/2011 1923   ALKPHOS 76 01/15/2014 1221   ALKPHOS 68 01/27/2011 1923   BILITOT 0.46 01/15/2014 1221   BILITOT 0.2* 01/27/2011 1923   GFRNONAA >60 01/27/2011 1923   GFRAA  Value: >60        The eGFR has been calculated using the MDRD equation. This calculation has not been validated in all clinical situations. eGFR's persistently <60 mL/min signify possible Chronic Kidney Disease. 01/27/2011 1923         RADIOGRAPHY: Mm Digital Diagnostic Unilat R  01/08/2014   CLINICAL DATA:  Ultrasound-guided core needle biopsy of a mass at 9:30 o'clock, 7 cm from the right nipple with clip placement.  EXAM: POST-BIOPSY CLIP PLACEMENT RIGHT DIAGNOSTIC MAMMOGRAM  COMPARISON:  Previous exams.  FINDINGS: Films are performed following ultrasound guided biopsy of a mass at 9:30 o'clock 7 cm from the right nipple. The ribbon clip is in the expected location of the nodule. The mass is obscured by a small hematoma.  IMPRESSION: Appropriate clip placement following ultrasound-guided core needle biopsy  of a mass at 9:30 o'clock, 7 cm from the right nipple. The mass is obscured by a small hematoma.  Final Assessment: Post Procedure Mammograms for Marker Placement   Electronically Signed   By: Cain Saupe M.D.   On: 01/08/2014 10:42   Mm Digital Diagnostic Unilat R  01/06/2014   CLINICAL DATA:  Patient recalled from screening for right breast mass.  EXAM: DIGITAL DIAGNOSTIC  RIGHT MAMMOGRAM  ULTRASOUND RIGHT BREAST  COMPARISON:  [facility]  ACR Breast Density Category b: There are scattered areas of fibroglandular density.  FINDINGS: Spot compression CC and MLO views demonstrate a persistent irregular 8 mm mass within the lateral aspect of the right breast middle to posterior depth. Slightly inferior lateral to this irregular mass is an adjacent circumscribed 7 mm mass which is stable compared to  multiple priors.  On physical exam, left palpate no discrete mass within the lateral aspect of the right breast.  Ultrasound is performed, showing a 0.6 x 0.7 x 0.4 cm irregular hypoechoic mass within the right breast 930 o'clock 6 cm from the nipple. Additionally there is a simple cyst within the right breast 9 o'clock position 7 cm from the nipple. No right axillary lymphadenopathy.  IMPRESSION: Suspicious mass within the right breast 930 o'clock. Ultrasound-guided core needle biopsy is recommended.  RECOMMENDATION: Ultrasound-guided core needle biopsy suspicious right breast mass. This is scheduled for 01/08/2014 at 9 a.m.  I have discussed the findings and recommendations with the patient. Results were also provided in writing at the conclusion of the visit. If applicable, a reminder letter will be sent to the patient regarding the next appointment.  BI-RADS CATEGORY  4: Suspicious abnormality - biopsy should be considered.   Electronically Signed   By: Lovey Newcomer M.D.   On: 01/06/2014 11:52   US Breast Ltd Uni Right Inc Axilla  01/06/2014   CLINICAL DATA:  Patient recalled from screening for right breast mass.   EXAM: DIGITAL DIAGNOSTIC  RIGHT MAMMOGRAM  ULTRASOUND RIGHT BREAST  COMPARISON:  [facility]  ACR Breast Density Category b: There are scattered areas of fibroglandular density.  FINDINGS: Spot compression CC and MLO views demonstrate a persistent irregular 8 mm mass within the lateral aspect of the right breast middle to posterior depth. Slightly inferior lateral to this irregular mass is an adjacent circumscribed 7 mm mass which is stable compared to multiple priors.  On physical exam, left palpate no discrete mass within the lateral aspect of the right breast.  Ultrasound is performed, showing a 0.6 x 0.7 x 0.4 cm irregular hypoechoic mass within the right breast 930 o'clock 6 cm from the nipple. Additionally there is a simple cyst within the right breast 9 o'clock position 7 cm from the nipple. No right axillary lymphadenopathy.  IMPRESSION: Suspicious mass within the right breast 930 o'clock. Ultrasound-guided core needle biopsy is recommended.  RECOMMENDATION: Ultrasound-guided core needle biopsy suspicious right breast mass. This is scheduled for 01/08/2014 at 9 a.m.  I have discussed the findings and recommendations with the patient. Results were also provided in writing at the conclusion of the visit. If applicable, a reminder letter will be sent to the patient regarding the next appointment.  BI-RADS CATEGORY  4: Suspicious abnormality - biopsy should be considered.   Electronically Signed   By: Lovey Newcomer M.D.   On: 01/06/2014 11:52   [place] Rt Breast Bx W Loc Dev 1st Lesion Img Bx Spec US Guide  01/09/2014   ADDENDUM REPORT: 01/09/2014 11:42  ADDENDUM: Pathologic results indicate invasive mammary carcinoma with papillary features. This is concordant with the imaging findings. The patient has a pending appointment with the multidisciplinary Clinic on January 15, 2014. I gave these results and recommendations to the patient by telephone at 11:30 a.m. on 01/09/2014. The patient expressed moderate tenderness with no  significant bruising related to the biopsy.   Electronically Signed   By: Skipper Cliche M.D.   On: 01/09/2014 11:42   01/09/2014   CLINICAL DATA:  Oval mass at 9:30 o'clock, 7 cm from the right nipple.  EXAM: ULTRASOUND GUIDED RIGHT BREAST CORE NEEDLE BIOPSY WITH VACUUM ASSISTANCE  COMPARISON:  Previous exams.  PROCEDURE: I met with the patient and we discussed the procedure of ultrasound-guided biopsy, including benefits and alternatives. We discussed the high likelihood of a successful procedure. We  discussed the risks of the procedure including infection, bleeding, tissue injury, clip migration, and inadequate sampling. Informed written consent was given. The usual time-out protocol was performed immediately prior to the procedure.  Using sterile technique, 2% Lidocaine as local anesthetic, and direct ultrasound visualization, a 12 gauge vacuum-assisteddevice was used to perform core needle biopsy of the mass at 9:30 o'clock 7 cm from the right nipple using a caudocranial approach. At the conclusion of the procedure, a ribbon tissue marker clip was deployed into the biopsy cavity. Follow-up 2-view mammogram was performed and dictated separately.  IMPRESSION: Ultrasound-guided biopsy of an oval mass at 9:30 o'clock, 7 cm from the right nipple. No apparent complications.  Electronically Signed: By: Ulyess Blossom M.D. On: 01/08/2014 10:09      IMPRESSION/PLAN: She has been discussed at our multidisciplinary tumor board.  The consensus is that she be a good candidate for breast conservation. I talked to her about the option of a mastectomy and informed her that her expected overall survival would be equivalent between mastectomy and breast conservation, based upon randomized controlled data. She is enthusiastic about breast conservation.  For the patient's early stage favorable risk breast cancer, we had a thorough discussion about her options for adjuvant therapy. One option would be antiestrogen therapy  as discussed with medical oncology. She would take a pill for approximately 5 years. The alternative option (but less standard) would be radiotherapy to the breast. The most aggressive option would be to pursue both modalities.  Of note, I discussed the data from the [employer] al trial in the [employer]. She understands that tamoxifen compared to radiation plus tamoxifen demonstrated no survival benefit among the women in this study. The women were 74 years or older with stage I estrogen receptor positive breast cancer. Based on this study, I told Ms. Moragne that her overall life expectancy should not be affected by adding radiotherapy to antiestrogen medication. She understands that the main benefit of  adding radiotherapy to anti estrogen therapy would be a very small but measurable local control benefit (risk of local recurrence to be lowered from ~9% --> ~2% over a decade).  We discussed the fact that radiotherapy only provides a local control benefit wall estrogen provide systemic coverage. That being said, the risk of systemic failure is relatively low with her type of breast cancer.  She would like to think about the side effects of antiestrogen therapy and radiation therapy before making her decision. She has some reservations about whether should be able to tolerate an antiestrogen pill for 5 years and is considering radiation as a backup in case she cannot tolerate the antiestrogen pills very well.  We discussed the risks benefits and side effects of radiotherapy. She understands that the side effects would likely include some skin irritation and fatigue during the weeks of radiation. There is a risk of late effects which include but are not necessarily limited to cosmetic changes and rare lung toxicity. I would anticipate delivering approximately 5 weeks of radiotherapy: she would probably receive 50 gray in 25 fractions as I do not think her anatomy would be conducive to  hypofractionation.   After a thorough discussion, the patient would like to think about her options.  I asked our patient navigator to schedule a post-op followup with me so we can review her pathology and discuss her options further.    __________________________________________   Eppie Gibson, MD

## 2014-01-16 ENCOUNTER — Encounter: Payer: Self-pay | Admitting: *Deleted

## 2014-01-17 ENCOUNTER — Other Ambulatory Visit (INDEPENDENT_AMBULATORY_CARE_PROVIDER_SITE_OTHER): Payer: Self-pay | Admitting: General Surgery

## 2014-01-17 DIAGNOSIS — C50411 Malignant neoplasm of upper-outer quadrant of right female breast: Secondary | ICD-10-CM

## 2014-01-20 ENCOUNTER — Encounter: Payer: Self-pay | Admitting: *Deleted

## 2014-01-20 ENCOUNTER — Other Ambulatory Visit: Payer: Self-pay | Admitting: *Deleted

## 2014-01-20 DIAGNOSIS — C50919 Malignant neoplasm of unspecified site of unspecified female breast: Secondary | ICD-10-CM

## 2014-01-20 NOTE — [facility] Oncology Navigator Note (Signed)
I called patient to follow up from [facility] on 01/15/14.  Patient asked why her age affected her treatment.  I reviewed treatment plan with patient.  We discussed that treatment is based upon NCCN guidelines which are based upon research outcomes and statistics.  Patient also asked for copies of her bills and notes from her visits that she will need to turn in to her cancer insurance provided.  I gave her the phone numbers for Medical Records and Customer Service at [employer].  I reviewed her scheduled appointments.  Patient denied any other questions or concerns related to her diagnosis and treatment plan at this time.  She verified that she has my contact information and I encouraged her to call me for any needs.  Patient verbalized understanding.

## 2014-01-22 ENCOUNTER — Telehealth: Payer: Self-pay | Admitting: *Deleted

## 2014-01-22 ENCOUNTER — Encounter (HOSPITAL_BASED_OUTPATIENT_CLINIC_OR_DEPARTMENT_OTHER)
Admission: RE | Admit: 2014-01-22 | Discharge: 2014-01-22 | Disposition: A | Discharge status: Home / Self Care | Payer: Medicare Other | Source: Ambulatory Visit | Attending: General Surgery | Admitting: General Surgery

## 2014-01-22 ENCOUNTER — Other Ambulatory Visit: Payer: Self-pay

## 2014-01-22 ENCOUNTER — Encounter (HOSPITAL_BASED_OUTPATIENT_CLINIC_OR_DEPARTMENT_OTHER): Payer: Self-pay | Admitting: *Deleted

## 2014-01-22 DIAGNOSIS — Z01810 Encounter for preprocedural cardiovascular examination: Secondary | ICD-10-CM | POA: Insufficient documentation

## 2014-01-22 NOTE — Progress Notes (Signed)
pts sister coming with her-husband is 35. 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

## 2014-01-22 NOTE — Telephone Encounter (Signed)
Faxed Care Plan to PCP and took to HIM to scan.  

## 2014-01-27 ENCOUNTER — Encounter (HOSPITAL_BASED_OUTPATIENT_CLINIC_OR_DEPARTMENT_OTHER): Payer: Medicare Other | Admitting: Certified Registered"

## 2014-01-27 ENCOUNTER — Ambulatory Visit
Admission: RE | Admit: 2014-01-27 | Discharge: 2014-01-27 | Disposition: A | Discharge status: Home / Self Care | Payer: Medicare Other | Source: Ambulatory Visit | Attending: General Surgery | Admitting: General Surgery

## 2014-01-27 ENCOUNTER — Encounter (HOSPITAL_BASED_OUTPATIENT_CLINIC_OR_DEPARTMENT_OTHER)
Admission: RE | Disposition: A | Discharge status: Home / Self Care | Payer: Self-pay | Source: Ambulatory Visit | Attending: General Surgery

## 2014-01-27 ENCOUNTER — Encounter (HOSPITAL_BASED_OUTPATIENT_CLINIC_OR_DEPARTMENT_OTHER): Payer: Self-pay | Admitting: *Deleted

## 2014-01-27 ENCOUNTER — Ambulatory Visit
Admit: 2014-01-27 | Discharge: 2014-01-27 | Disposition: A | Discharge status: Home / Self Care | Payer: Medicare Other | Attending: General Surgery | Admitting: General Surgery

## 2014-01-27 ENCOUNTER — Ambulatory Visit (HOSPITAL_BASED_OUTPATIENT_CLINIC_OR_DEPARTMENT_OTHER)
Admission: RE | Admit: 2014-01-27 | Discharge: 2014-01-27 | Disposition: A | Discharge status: Home / Self Care | Payer: Medicare Other | Source: Ambulatory Visit | Attending: General Surgery | Admitting: General Surgery

## 2014-01-27 ENCOUNTER — Ambulatory Visit (HOSPITAL_BASED_OUTPATIENT_CLINIC_OR_DEPARTMENT_OTHER): Payer: Medicare Other | Admitting: Certified Registered"

## 2014-01-27 DIAGNOSIS — Z780 Asymptomatic menopausal state: Secondary | ICD-10-CM | POA: Insufficient documentation

## 2014-01-27 DIAGNOSIS — E785 Hyperlipidemia, unspecified: Secondary | ICD-10-CM | POA: Insufficient documentation

## 2014-01-27 DIAGNOSIS — C50411 Malignant neoplasm of upper-outer quadrant of right female breast: Secondary | ICD-10-CM

## 2014-01-27 DIAGNOSIS — C50919 Malignant neoplasm of unspecified site of unspecified female breast: Secondary | ICD-10-CM

## 2014-01-27 DIAGNOSIS — Z87891 Personal history of nicotine dependence: Secondary | ICD-10-CM | POA: Insufficient documentation

## 2014-01-27 DIAGNOSIS — E669 Obesity, unspecified: Secondary | ICD-10-CM | POA: Insufficient documentation

## 2014-01-27 DIAGNOSIS — I10 Essential (primary) hypertension: Secondary | ICD-10-CM | POA: Insufficient documentation

## 2014-01-27 DIAGNOSIS — Z170 Estrogen receptor positive status [ER+]: Secondary | ICD-10-CM | POA: Insufficient documentation

## 2014-01-27 DIAGNOSIS — Z85828 Personal history of other malignant neoplasm of skin: Secondary | ICD-10-CM | POA: Insufficient documentation

## 2014-01-27 DIAGNOSIS — E079 Disorder of thyroid, unspecified: Secondary | ICD-10-CM | POA: Insufficient documentation

## 2014-01-27 DIAGNOSIS — Z86711 Personal history of pulmonary embolism: Secondary | ICD-10-CM | POA: Insufficient documentation

## 2014-01-27 DIAGNOSIS — C50419 Malignant neoplasm of upper-outer quadrant of unspecified female breast: Secondary | ICD-10-CM | POA: Insufficient documentation

## 2014-01-27 HISTORY — PX: BREAST LUMPECTOMY: SHX2

## 2014-01-27 HISTORY — DX: Presence of spectacles and contact lenses: Z97.3

## 2014-01-27 HISTORY — PX: BREAST LUMPECTOMY WITH NEEDLE LOCALIZATION: SHX5759

## 2014-01-27 SURGERY — BREAST LUMPECTOMY WITH NEEDLE LOCALIZATION
Anesthesia: General | Site: Breast | Laterality: Right

## 2014-01-27 MED ORDER — HEPARIN SODIUM (PORCINE) 5000 UNIT/ML IJ SOLN
INTRAMUSCULAR | Status: AC
  Filled 2014-01-27: qty 1

## 2014-01-27 MED ORDER — CEFAZOLIN SODIUM-DEXTROSE 2-3 GM-% IV SOLR
2.0000 g | INTRAVENOUS | Status: AC
  Administered 2014-01-27: 2 g via INTRAVENOUS

## 2014-01-27 MED ORDER — ONDANSETRON HCL 4 MG/2ML IJ SOLN
INTRAMUSCULAR | Status: DC | PRN
  Administered 2014-01-27: 4 mg via INTRAVENOUS

## 2014-01-27 MED ORDER — FENTANYL CITRATE 0.05 MG/ML IJ SOLN: 50.0000 ug | INTRAMUSCULAR | Status: DC | PRN

## 2014-01-27 MED ORDER — HYDROMORPHONE HCL PF 1 MG/ML IJ SOLN
0.2500 mg | INTRAMUSCULAR | Status: DC | PRN
  Administered 2014-01-27: 0.5 mg via INTRAVENOUS

## 2014-01-27 MED ORDER — MIDAZOLAM HCL 2 MG/2ML IJ SOLN: 1.0000 mg | INTRAMUSCULAR | Status: DC | PRN

## 2014-01-27 MED ORDER — LIDOCAINE HCL (CARDIAC) 20 MG/ML IV SOLN
INTRAVENOUS | Status: DC | PRN
  Administered 2014-01-27: 60 mg via INTRAVENOUS

## 2014-01-27 MED ORDER — LACTATED RINGERS IV SOLN
INTRAVENOUS | Status: DC
  Administered 2014-01-27 (×2): via INTRAVENOUS

## 2014-01-27 MED ORDER — BUPIVACAINE-EPINEPHRINE 0.5% -1:200000 IJ SOLN
INTRAMUSCULAR | Status: DC | PRN
  Administered 2014-01-27: 30 mL

## 2014-01-27 MED ORDER — DEXAMETHASONE SODIUM PHOSPHATE 4 MG/ML IJ SOLN
INTRAMUSCULAR | Status: DC | PRN
  Administered 2014-01-27: 10 mg via INTRAVENOUS

## 2014-01-27 MED ORDER — HYDROCODONE-ACETAMINOPHEN 5-325 MG PO TABS: 1.0000 | ORAL_TABLET | ORAL | Status: DC | PRN

## 2014-01-27 MED ORDER — CHLORHEXIDINE GLUCONATE 4 % EX LIQD: 1.0000 "application " | Freq: Once | CUTANEOUS | Status: DC

## 2014-01-27 MED ORDER — HEPARIN SODIUM (PORCINE) 5000 UNIT/ML IJ SOLN
5000.0000 [IU] | Freq: Once | INTRAMUSCULAR | Status: AC
  Administered 2014-01-27: 5000 [IU] via SUBCUTANEOUS

## 2014-01-27 MED ORDER — HYDROMORPHONE HCL PF 1 MG/ML IJ SOLN
INTRAMUSCULAR | Status: AC
  Filled 2014-01-27: qty 1

## 2014-01-27 MED ORDER — OXYCODONE HCL 5 MG PO TABS: 5.0000 mg | ORAL_TABLET | Freq: Once | ORAL | Status: DC | PRN

## 2014-01-27 MED ORDER — PROPOFOL 10 MG/ML IV BOLUS
INTRAVENOUS | Status: DC | PRN
  Administered 2014-01-27: 150 mg via INTRAVENOUS

## 2014-01-27 MED ORDER — CEFAZOLIN SODIUM-DEXTROSE 2-3 GM-% IV SOLR
INTRAVENOUS | Status: AC
  Filled 2014-01-27: qty 50

## 2014-01-27 MED ORDER — BUPIVACAINE-EPINEPHRINE PF 0.5-1:200000 % IJ SOLN
INTRAMUSCULAR | Status: AC
  Filled 2014-01-27: qty 30

## 2014-01-27 MED ORDER — OXYCODONE HCL 5 MG/5ML PO SOLN: 5.0000 mg | Freq: Once | ORAL | Status: DC | PRN

## 2014-01-27 MED ORDER — FENTANYL CITRATE 0.05 MG/ML IJ SOLN
INTRAMUSCULAR | Status: AC
  Filled 2014-01-27: qty 6

## 2014-01-27 MED ORDER — FENTANYL CITRATE 0.05 MG/ML IJ SOLN
INTRAMUSCULAR | Status: DC | PRN
  Administered 2014-01-27 (×2): 25 ug via INTRAVENOUS
  Administered 2014-01-27: 50 ug via INTRAVENOUS

## 2014-01-27 SURGICAL SUPPLY — 51 items
BINDER BREAST XLRG (GAUZE/BANDAGES/DRESSINGS) ×3 IMPLANT
BLADE SURG 10 STRL SS (BLADE) IMPLANT
BLADE SURG 15 STRL LF DISP TIS (BLADE) ×1 IMPLANT
BLADE SURG 15 STRL SS (BLADE) ×2
CANISTER SUCT 1200ML W/VALVE (MISCELLANEOUS) ×3 IMPLANT
CHLORAPREP W/TINT 26ML (MISCELLANEOUS) ×3 IMPLANT
CLIP TI MEDIUM 6 (CLIP) IMPLANT
CLIP TI WIDE RED SMALL 6 (CLIP) ×3 IMPLANT
COVER MAYO STAND STRL (DRAPES) ×3 IMPLANT
COVER TABLE BACK 60X90 (DRAPES) ×3 IMPLANT
DERMABOND ADVANCED (GAUZE/BANDAGES/DRESSINGS) ×2
DERMABOND ADVANCED .7 DNX12 (GAUZE/BANDAGES/DRESSINGS) ×1 IMPLANT
DEVICE DUBIN W/COMP PLATE 8390 (MISCELLANEOUS) ×3 IMPLANT
DRAPE PED LAPAROTOMY (DRAPES) ×3 IMPLANT
DRAPE UTILITY XL STRL (DRAPES) ×3 IMPLANT
ELECT COATED BLADE 2.86 ST (ELECTRODE) ×3 IMPLANT
ELECT REM PT RETURN 9FT ADLT (ELECTROSURGICAL) ×3
ELECTRODE REM PT RTRN 9FT ADLT (ELECTROSURGICAL) ×1 IMPLANT
GLOVE BIOGEL M 7.0 STRL (GLOVE) ×3 IMPLANT
GLOVE BIOGEL PI IND STRL 7.5 (GLOVE) ×1 IMPLANT
GLOVE BIOGEL PI IND STRL 8 (GLOVE) ×1 IMPLANT
GLOVE BIOGEL PI INDICATOR 7.5 (GLOVE) ×2
GLOVE BIOGEL PI INDICATOR 8 (GLOVE) ×2
GLOVE EXAM NITRILE EXT CUFF MD (GLOVE) ×3 IMPLANT
GLOVE SS BIOGEL STRL SZ 7.5 (GLOVE) ×1 IMPLANT
GLOVE SUPERSENSE BIOGEL SZ 7.5 (GLOVE) ×2
GLOVE SURG SS PI 7.0 STRL IVOR (GLOVE) ×3 IMPLANT
GOWN STRL REUS W/ TWL LRG LVL3 (GOWN DISPOSABLE) ×2 IMPLANT
GOWN STRL REUS W/ TWL XL LVL3 (GOWN DISPOSABLE) ×1 IMPLANT
GOWN STRL REUS W/TWL LRG LVL3 (GOWN DISPOSABLE) ×4
GOWN STRL REUS W/TWL XL LVL3 (GOWN DISPOSABLE) ×2
KIT MARKER MARGIN INK (KITS) ×3 IMPLANT
NEEDLE HYPO 25X1 1.5 SAFETY (NEEDLE) ×3 IMPLANT
NS IRRIG 1000ML POUR BTL (IV SOLUTION) ×3 IMPLANT
PACK BASIN DAY SURGERY FS (CUSTOM PROCEDURE TRAY) ×3 IMPLANT
PENCIL BUTTON HOLSTER BLD 10FT (ELECTRODE) ×3 IMPLANT
SLEEVE SCD COMPRESS KNEE MED (MISCELLANEOUS) ×3 IMPLANT
STAPLER VISISTAT 35W (STAPLE) IMPLANT
SUT MON AB 3-0 SH 27 (SUTURE)
SUT MON AB 3-0 SH27 (SUTURE) IMPLANT
SUT MON AB 5-0 PS2 18 (SUTURE) ×3 IMPLANT
SUT SILK 3 0 SH 30 (SUTURE) IMPLANT
SUT VIC AB 4-0 BRD 54 (SUTURE) IMPLANT
SUT VICRYL 3-0 CR8 SH (SUTURE) ×3 IMPLANT
SYR BULB 3OZ (MISCELLANEOUS) IMPLANT
SYR CONTROL 10ML LL (SYRINGE) ×3 IMPLANT
TOWEL OR 17X24 6PK STRL BLUE (TOWEL DISPOSABLE) ×3 IMPLANT
TOWEL OR NON WOVEN STRL DISP B (DISPOSABLE) ×3 IMPLANT
TUBE CONNECTING 20'X1/4 (TUBING) ×1
TUBE CONNECTING 20X1/4 (TUBING) ×2 IMPLANT
YANKAUER SUCT BULB TIP NO VENT (SUCTIONS) ×3 IMPLANT

## 2014-01-27 NOTE — Anesthesia Postprocedure Evaluation (Signed)
  Anesthesia Post-op Note  Patient: Jessica Bautista  Procedure(s) Performed: Procedure(s): BREAST LUMPECTOMY WITH NEEDLE LOCALIZATION (Right)  Patient Location: PACU  Anesthesia Type:General  Level of Consciousness: awake and alert   Airway and Oxygen Therapy: Patient Spontanous Breathing  Post-op Pain: mild  Post-op Assessment: Post-op Vital signs reviewed, Patient's Cardiovascular Status Stable and Respiratory Function Stable  Post-op Vital Signs: Reviewed  Filed Vitals:   01/27/14 1530  BP: 134/81  Pulse: 73  Temp:   Resp: 11    Complications: No apparent anesthesia complications

## 2014-01-27 NOTE — Anesthesia Procedure Notes (Signed)
Procedure Name: LMA Insertion Date/Time: 01/27/2014 1:55 PM Performed by: Robyne Matar Pre-anesthesia Checklist: Patient identified, Emergency Drugs available, Suction available and Patient being monitored Patient Re-evaluated:Patient Re-evaluated prior to inductionOxygen Delivery Method: Circle System Utilized Preoxygenation: Pre-oxygenation with 100% oxygen Intubation Type: IV induction Ventilation: Mask ventilation without difficulty LMA: LMA inserted LMA Size: 4.0 Number of attempts: 1 Airway Equipment and Method: bite block Placement Confirmation: positive ETCO2 Tube secured with: Tape Dental Injury: Teeth and Oropharynx as per pre-operative assessment

## 2014-01-27 NOTE — Op Note (Signed)
Preoperative Diagnosis: breast cancer, right  Postoprative Diagnosis: breast cancer, right  Procedure: Procedure(s): right BREAST LUMPECTOMY WITH NEEDLE LOCALIZATION   Surgeon: Excell Seltzer T   Assistants: none  Anesthesia:  General LMA anesthesia  Indications:  Patient is a 74 year old female on recent screening mammogram was found to have a subtle and centimeter mass in the lateral aspect of the right breast. Subsequent large core needle biopsy has revealed invasive ductal carcinoma, estrogen receptor positive, clinical stage IA. After extensive discussion regarding treatment options and risks detailed elsewhere we have elected to proceed with needle localized lumpectomy without sentinel lymph node biopsy as initial surgical treatment. Following needle localization at the breast center she is brought to the OR.  Procedure Detail:  Patient was brought to the operating room, placed in the supine position on the operating table, and general laryngeal mask anesthesia induced. She received preoperative IV antibiotics. The right breast was widely sterilely prepped and draped. Patient time out was performed and correct procedure verified. The wire had been placed from a very lateral position and I made an incision at the 9:00 position 6 cm from the nipple at the expected location of the tumor. Dissection was carried down through the subcutaneous tissue and into the breast capsule. I then dissected posteriorly and encountered the wire which was brought into the incision. Using cautery I then excised a generous portion of breast tissue around the shaft and tip of the wire in order to obtain a negative margin. I was able to feel a mass, tumor or biopsy reaction at the very postero medial aspect of the specimen as I performed the excision.  A specimen mammogram was obtained which showed the wire and clip within the specimen although the clip was oriented somewhat more medial and posterior to the wire.  With these findings I elected to take further margins posteriorly and medially and each of these was taken back another centimeter with cautery and oriented and sent as separate specimens. The original lumpectomy was inked and sent for permanent pathology. I also took a small further excision of the anterior margin which was also oriented and sent for permanent pathology. Following this complete hemostasis was obtained in the wound with cautery and the soft tissue was infiltrated with Marcaine. The lumpectomy cavity was marked with clips. The deep subcutaneous tissue was closed with interrupted 3-0 Vicryl and the skin with subcuticular 4-0 Monocryl and Dermabond. Sponge needle and instrument counts were correct.    Findings: As above  Estimated Blood Loss:  less than 50 mL         Drains: none  Blood Given: none          Specimens: #1 right breast lumpectomy   #2 further posterior margin   #3 further medial margin   #4 further anterior margin        Complications:  * No complications entered in OR log *         Disposition: PACU - hemodynamically stable.         Condition: stable

## 2014-01-27 NOTE — Anesthesia Preprocedure Evaluation (Signed)
Anesthesia Evaluation  Patient identified by MRN, date of birth, ID band Patient awake    Reviewed: Allergy & Precautions, H&P , NPO status , Patient's Chart, lab work & pertinent test results  Airway Mallampati: II TM Distance: >3 FB Neck ROM: Full    Dental no notable dental hx. (+) Teeth Intact, Dental Advisory Given   Pulmonary neg pulmonary ROS, former smoker,  breath sounds clear to auscultation  Pulmonary exam normal       Cardiovascular hypertension, On Medications Rhythm:Regular Rate:Normal     Neuro/Psych negative neurological ROS  negative psych ROS   GI/Hepatic negative GI ROS, Neg liver ROS,   Endo/Other  negative endocrine ROS  Renal/GU negative Renal ROS  negative genitourinary   Musculoskeletal   Abdominal   Peds  Hematology negative hematology ROS (+)   Anesthesia Other Findings   Reproductive/Obstetrics negative OB ROS                           Anesthesia Physical Anesthesia Plan  ASA: II  Anesthesia Plan: General   Post-op Pain Management:    Induction: Intravenous  Airway Management Planned: LMA  Additional Equipment:   Intra-op Plan:   Post-operative Plan: Extubation in OR  Informed Consent: I have reviewed the patients History and Physical, chart, labs and discussed the procedure including the risks, benefits and alternatives for the proposed anesthesia with the patient or authorized representative who has indicated his/her understanding and acceptance.   Dental advisory given  Plan Discussed with: CRNA  Anesthesia Plan Comments:         Anesthesia Quick Evaluation

## 2014-01-27 NOTE — H&P (View-Only) (Signed)
Chief Complaint: New diagnosis of breast cancer  History:    Jessica Bautista is a 73 y.o. postmenopausal female referred by Dr. Sadie Haber  for evaluation of recently diagnosed carcinoma of the right breast. She recently presented for a screening mamogram revealing a possible small mass in the right breast..  Subsequent imaging included diagnostic mamogram showing an irregular 8 mm mass in the lateral aspect of the right breast and ultrasound showing 6 mm irregular hypoechoic mass at the 9:30 position 6 cm from the nipple..   A ultrasound guided biopsy was performed on 01/08/2014 with pathology revealing invasive mammary carcinoma with papillary features.  She is seen now in multidisciplinary breast clinic for initial treatment planning.  She has experienced no breast symptoms specifically mass or nipple discharge or skin changes.  She does not have a personal history of any previous breast problems.  Findings at that time were the following:  Tumor size: 0.6 cm  Tumor grade: 2  Estrogen Receptor: positive Progesterone Receptor: positive  Her-2 neu: negative  Lymph node status: negative Neurovascular invasion: no Lymphatic invasion: no  Past Medical History  Diagnosis Date  . Hypertension   . Thyroid disease   . Skin cancer   . Hyperlipidemia   . Psoriasis   . Pulmonary embolus     Past Surgical History  Procedure Laterality Date  . Ankle fracture surgery    . Wrist fracture surgery    . Abdominoplasty      Current Outpatient Prescriptions  Medication Sig Dispense Refill  . escitalopram (LEXAPRO) 10 MG tablet Take 10 mg by mouth daily.       Marland Kitchen lisinopril (PRINIVIL,ZESTRIL) 10 MG tablet Take 10 mg by mouth daily.      . pravastatin (PRAVACHOL) 40 MG tablet Take 20 mg by mouth daily.       Marland Kitchen SYNTHROID 112 MCG tablet Take 112 mcg by mouth daily before breakfast.       . zolpidem (AMBIEN) 10 MG tablet 10 mg at bedtime as needed.        No current facility-administered medications for  this visit.    No family history on file.  History   Social History  . Marital Status: Married    Spouse Name: N/A    Number of Children: N/A  . Years of Education: N/A   Social History Main Topics  . Smoking status: Former Smoker -- 1.00 packs/day    Types: Cigarettes  . Smokeless tobacco: Not on file  . Alcohol Use: 1.8 oz/week    3 Glasses of wine per week  . Drug Use: No  . Sexual Activity: Not on file   Other Topics Concern  . Not on file   Social History Narrative  . No narrative on file     Review of Systems Constitutional: negative Respiratory: negative Cardiovascular: negative Gastrointestinal: negative, colonoscopy is up-to-date Hematologic/lymphatic: negative except for history of pulmonary embolus 18 years ago following an abdominoplasty     Objective:  There were no vitals taken for this visit.  General: Alert, moderately obese Caucasian female who appears younger than her stated age, in no distress Skin: Warm and dry without rash or infection. HEENT: No palpable masses or thyromegaly. Sclera nonicteric. Pupils equal round and reactive. Oropharynx clear. Breasts: slight bruising in thickening lateral right breast likely secondary to biopsy. No other palpable masses or skin change or nipple discharge in either breast Lymph nodes: No cervical, supraclavicular, or inguinal nodes palpable. Lungs: Breath sounds clear and  equal without increased work of breathing Cardiovascular: Regular rate and rhythm without murmur. No JVD or edema. Peripheral pulses intact. Abdomen: Nondistended. Soft and nontender. No masses palpable. No organomegaly. No palpable hernias. Extremities: No edema or joint swelling or deformity. No chronic venous stasis changes. Neurologic: Alert and fully oriented. Gait normal.   Laboratory data:  CBC:  Lab Results  Component Value Date   WBC 6.5 01/15/2014   WBC 8.3 01/27/2011   RBC 3.96 01/15/2014   RBC 4.22 01/27/2011   HGB 12.4  01/15/2014   HGB 13.3 01/27/2011   HCT 37.5 01/15/2014   HCT 40.4 01/27/2011   PLT 243 01/15/2014   PLT 236 01/27/2011  ]  [facility] Labs:  Lab Results  Component Value Date   NA 142 01/15/2014   NA 141 01/27/2011   K 4.5 01/15/2014   K 4.4 01/27/2011   CL 108 01/27/2011   CO2 27 01/15/2014   CO2 26 01/27/2011   BUN 11.4 01/15/2014   BUN 17 01/27/2011   CREATININE 0.7 01/15/2014   CREATININE 0.79 01/27/2011   CALCIUM 9.2 01/15/2014   CALCIUM 9.2 01/27/2011   PROT 6.8 01/15/2014   PROT 7.3 01/27/2011   BILITOT 0.46 01/15/2014   BILITOT 0.2* 01/27/2011   ALKPHOS 76 01/15/2014   ALKPHOS 68 01/27/2011   AST 40* 01/15/2014   AST 64* 01/27/2011   ALT 53 01/15/2014   ALT 89* 01/27/2011     Assessment  73 y.o. female with a new diagnosis of cancer of the the right breast upper outer quadrant.  Clinical IA, estrogen receptor positive, progesterone receptor positive and Her2/Neu protein/oncogene negative. I discussed with the patient and family members present today initial surgical treatment options. We discussed options of breast conservation with lumpectomy or total mastectomy and sentinal lymph node biopsy/dissection. Options for reconstruction were discussed. After discussion they have elected to proceed with right partial mastectomy.  After discussion with medical and radiation oncology and they feel that treatment would not be altered issue her lymph node positive. Current plan would be for postoperative hormonal treatment only. We discussed that sentinel lymph node biopsy would provide only prognostic information and not change her treatment and is very likely to be negative and she first sentinel lymph node biopsy which is our recommendation. We discussed the indications and nature of the procedure, and expected recovery, in detail. Surgical risks including anesthetic complications, cardiorespiratory complications, bleeding, infection, wound healing complications, blood clots, l  local and distant recurrence and  possible need for further surgery based on the final pathology was discussed and understood.  Chemotherapy, hormonal therapy and radiation therapy have been discussed. They have been provided with literature regarding the treatment of breast cancer.  All questions were answered. They understand and agree to proceed and we will go ahead with scheduling.  Plan right partial mastectomy needle localization as an outpatient under general anesthesia  Edward Jolly MD, FACS  01/15/2014, 3:47 PM

## 2014-01-27 NOTE — Discharge Instructions (Signed)
[facility] °Office Phone Number [phone] ° °BREAST BIOPSY/ PARTIAL MASTECTOMY: POST OP INSTRUCTIONS ° °Always review your discharge instruction sheet given to you by the facility where your surgery was performed. ° °IF YOU HAVE DISABILITY OR FAMILY LEAVE FORMS, YOU MUST BRING THEM TO THE OFFICE FOR PROCESSING.  DO NOT GIVE THEM TO YOUR DOCTOR. ° °1. A prescription for pain medication may be given to you upon discharge.  Take your pain medication as prescribed, if needed.  If narcotic pain medicine is not needed, then you may take acetaminophen (Tylenol) or ibuprofen (Advil) as needed. °2. Take your usually prescribed medications unless otherwise directed °3. If you need a refill on your pain medication, please contact your pharmacy.  They will contact our office to request authorization.  Prescriptions will not be filled after 5pm or on week-ends. °4. You should eat very light the first 24 hours after surgery, such as soup, crackers, pudding, etc.  Resume your normal diet the day after surgery. °5. Most patients will experience some swelling and bruising in the breast.  Ice packs and a good support bra will help.  Swelling and bruising can take several days to resolve.  °6. It is common to experience some constipation if taking pain medication after surgery.  Increasing fluid intake and taking a stool softener will usually help or prevent this problem from occurring.  A mild laxative (Milk of Magnesia or Miralax) should be taken according to package directions if there are no bowel movements after 48 hours. °7. Unless discharge instructions indicate otherwise, you may remove your bandages 24-48 hours after surgery, and you may shower at that time.  You may have steri-strips (small skin tapes) in place directly over the incision.  These strips should be left on the skin for 7-10 days.  If your surgeon used skin glue on the incision, you may shower in 24 hours.  The glue will flake off over the  next 2-3 weeks.  Any sutures or staples will be removed at the office during your follow-up visit. °8. ACTIVITIES:  You may resume regular daily activities (gradually increasing) beginning the next day.  Wearing a good support bra or sports bra minimizes pain and swelling.  You may have sexual intercourse when it is comfortable. °a. You may drive when you no longer are taking prescription pain medication, you can comfortably wear a seatbelt, and you can safely maneuver your car and apply brakes. °b. RETURN TO WORK:  ______________________________________________________________________________________ °9. You should see your doctor in the office for a follow-up appointment approximately two weeks after your surgery.  Your doctor’s nurse will typically make your follow-up appointment when she calls you with your pathology report.  Expect your pathology report 2-3 business days after your surgery.  You may call to check if you do not hear from us after three days. °10. OTHER INSTRUCTIONS: _______________________________________________________________________________________________ _____________________________________________________________________________________________________________________________________ °_____________________________________________________________________________________________________________________________________ °_____________________________________________________________________________________________________________________________________ ° °WHEN TO CALL YOUR DOCTOR: °1. Fever over 101.0 °2. Nausea and/or vomiting. °3. Extreme swelling or bruising. °4. Continued bleeding from incision. °5. Increased pain, redness, or drainage from the incision. ° °The clinic staff is available to answer your questions during regular business hours.  Please don’t hesitate to call and ask to speak to one of the nurses for clinical concerns.  If you have a medical emergency, go to the nearest  emergency room or call 911.  A surgeon from [facility] is always on call at the hospital. ° °For further questions, please visit [email or web address]  ° ° °  Post Anesthesia Home Care Instructions ° °Activity: °Get plenty of rest for the remainder of the day. A responsible adult should stay with you for 24 hours following the procedure.  °For the next 24 hours, DO NOT: °-Drive a car °-Operate machinery °-Drink alcoholic beverages °-Take any medication unless instructed by your physician °-Make any legal decisions or sign important papers. ° °Meals: °Start with liquid foods such as gelatin or soup. Progress to regular foods as tolerated. Avoid greasy, spicy, heavy foods. If nausea and/or vomiting occur, drink only clear liquids until the nausea and/or vomiting subsides. Call your physician if vomiting continues. ° °Special Instructions/Symptoms: °Your throat may feel dry or sore from the anesthesia or the breathing tube placed in your throat during surgery. If this causes discomfort, gargle with warm salt water. The discomfort should disappear within 24 hours. ° °

## 2014-01-27 NOTE — Transfer of Care (Signed)
Immediate Anesthesia Transfer of Care Note  Patient: Jessica Bautista  Procedure(s) Performed: Procedure(s): BREAST LUMPECTOMY WITH NEEDLE LOCALIZATION (Right)  Patient Location: PACU  Anesthesia Type:General  Level of Consciousness: awake, alert , oriented and patient cooperative  Airway & Oxygen Therapy: Patient Spontanous Breathing and Patient connected to face mask oxygen  Post-op Assessment: Report given to PACU RN and Post -op Vital signs reviewed and stable  Post vital signs: Reviewed and stable  Complications: No apparent anesthesia complications

## 2014-01-27 NOTE — Interval H&P Note (Signed)
History and Physical Interval Note:  01/27/2014 1:33 PM  Jessica Bautista  has presented today for surgery, with the diagnosis of breast cancer  The various methods of treatment have been discussed with the patient and family. After consideration of risks, benefits and other options for treatment, the patient has consented to  Procedure(s): BREAST LUMPECTOMY WITH NEEDLE LOCALIZATION (Right) as a surgical intervention .  The patient's history has been reviewed, patient examined, no change in status, stable for surgery.  I have reviewed the patient's chart and labs.  Questions were answered to the patient's satisfaction.     Akira Perusse T

## 2014-01-28 ENCOUNTER — Encounter (HOSPITAL_BASED_OUTPATIENT_CLINIC_OR_DEPARTMENT_OTHER): Payer: Self-pay | Admitting: General Surgery

## 2014-01-31 ENCOUNTER — Telehealth (INDEPENDENT_AMBULATORY_CARE_PROVIDER_SITE_OTHER): Payer: Self-pay | Admitting: General Surgery

## 2014-01-31 NOTE — Telephone Encounter (Signed)
Patient called wanting her pathology report.  Informed her that I would pass this message along to [facility] and Dr. Excell Seltzer for one of them to get in touch with her after they have reviewed the pathology report.

## 2014-01-31 NOTE — Telephone Encounter (Signed)
Called pt and discussed path report 

## 2014-02-06 ENCOUNTER — Encounter: Payer: Self-pay | Admitting: *Deleted

## 2014-02-06 NOTE — [facility] Oncology Navigator Note (Signed)
I called patient to check in following surgery on 01/27/14.  Patient reports that she is doing very well.  Her incisions are healing well.  We reviewed her upcoming appointments.  I gave patient directions to the Patient Accounting Office so that she can obtain copies of her bills to submit to her [employer] provider.  Patient denied any questions or concerns at this time.  I encouraged her to call me for any needs.

## 2014-02-10 ENCOUNTER — Encounter: Payer: Self-pay | Admitting: *Deleted

## 2014-02-10 NOTE — [facility] Oncology Navigator Note (Signed)
Patient called and left a voice mail message with questions about upcoming appointments.  I left patient a message, per her request, with her appointments and times. Contact information given and patient encouraged to call me with any other questions or needs.

## 2014-02-11 ENCOUNTER — Encounter: Payer: Self-pay | Admitting: *Deleted

## 2014-02-11 ENCOUNTER — Other Ambulatory Visit: Payer: Self-pay | Admitting: *Deleted

## 2014-02-11 DIAGNOSIS — C50411 Malignant neoplasm of upper-outer quadrant of right female breast: Secondary | ICD-10-CM

## 2014-02-11 NOTE — Progress Notes (Signed)
Location of Breast Cancer:Right Breast  Histology per Pathology Report:  01/27/14 Diagnosis 1. Breast, lumpectomy, Right - INVASIVE LOBULAR CARCINOMA, SEE COMMENT. - INVASIVE TUMOR IS LESS THAN 0.1 MM FROM NEAREST MARGIN (MEDIAL). - NEGATIVE FOR LYMPH VASCULAR INVASION. - LOBULAR NEOPLASIA (IN SITU CARCINOMA AND ATYPICAL HYPERPLASIA). - PREVIOUS BIOPSY SITE. - SEE TUMOR SYNOPTIC TEMPLATE BELOW. 2. Breast, excision, Right, posterior margin - LOBULAR NEOPLASIA (ATYPICAL LOBULAR HYPERPLASIA), SEE COMMENT. 1 of 4 FINAL for SIVAN, CUELLO ([id number]) Diagnosis(continued) 3. Breast, excision, Right, medial margin - LOBULAR NEOPLASIA (IN SITU CARCINOMA AND ATYPICAL LOBULAR HYPERPLASIA), SEE COMMENT. 4. Breast, excision, Right, anterior margin - LOBULAR NEOPLASIA (ATYPICAL LOBULAR HYPERPLASIA   01/08/14 Diagnosis Breast, right, needle core biopsy, 9:30 o'clock, 7 cm / nipple) - INVASIVE MAMMARY CARCINOMA WITH PAPILLARY FEATURES. - SEE COMMENT.  Receptor Status: ER100), PR (100), Her2-neu (No Amplification), Ki-67(19%)  Did patient present with symptoms (if so, please note symptoms) or was this found on screening mammography?:  Past/Anticipated interventions by surgeon, if any:  Past/Anticipated interventions by medical oncology, if any: Chemotherapy  Lymphedema issues, if any:  Pain issues, if any:  SAFETY ISSUES:  Prior radiation?    Pacemaker/ICD?  Possible current pregnancy?Is the patient on methotrexate?  {Current Complaints / other details:    Deirdre Evener, RN 02/11/2014,1:58 PM

## 2014-02-12 ENCOUNTER — Encounter: Payer: Self-pay | Admitting: *Deleted

## 2014-02-12 ENCOUNTER — Ambulatory Visit (HOSPITAL_BASED_OUTPATIENT_CLINIC_OR_DEPARTMENT_OTHER): Payer: Medicare Other | Admitting: Oncology

## 2014-02-12 ENCOUNTER — Encounter: Payer: Self-pay | Admitting: Radiation Oncology

## 2014-02-12 ENCOUNTER — Ambulatory Visit
Admission: RE | Admit: 2014-02-12 | Discharge: 2014-02-12 | Disposition: A | Discharge status: Home / Self Care | Payer: Medicare Other | Source: Ambulatory Visit | Attending: Radiation Oncology | Admitting: Radiation Oncology

## 2014-02-12 ENCOUNTER — Other Ambulatory Visit: Payer: Self-pay | Admitting: *Deleted

## 2014-02-12 ENCOUNTER — Other Ambulatory Visit (HOSPITAL_BASED_OUTPATIENT_CLINIC_OR_DEPARTMENT_OTHER): Payer: Medicare Other

## 2014-02-12 VITALS — BP 145/76 | HR 72 | Temp 97.8°F | Resp 18 | Ht 64.0 in | Wt 202.7 lb

## 2014-02-12 VITALS — BP 140/76 | HR 75 | Temp 98.3°F | Ht 64.0 in | Wt 203.3 lb

## 2014-02-12 DIAGNOSIS — C50411 Malignant neoplasm of upper-outer quadrant of right female breast: Secondary | ICD-10-CM

## 2014-02-12 DIAGNOSIS — Z09 Encounter for follow-up examination after completed treatment for conditions other than malignant neoplasm: Secondary | ICD-10-CM | POA: Insufficient documentation

## 2014-02-12 DIAGNOSIS — C50419 Malignant neoplasm of upper-outer quadrant of unspecified female breast: Secondary | ICD-10-CM

## 2014-02-12 DIAGNOSIS — Z170 Estrogen receptor positive status [ER+]: Secondary | ICD-10-CM

## 2014-02-12 DIAGNOSIS — Z79899 Other long term (current) drug therapy: Secondary | ICD-10-CM | POA: Insufficient documentation

## 2014-02-12 DIAGNOSIS — C50919 Malignant neoplasm of unspecified site of unspecified female breast: Secondary | ICD-10-CM

## 2014-02-12 LAB — CBC WITH DIFFERENTIAL/PLATELET
BASO%: 0.7 % (ref 0.0–2.0)
BASOS ABS: 0.1 10*3/uL (ref 0.0–0.1)
EOS ABS: 0.4 10*3/uL (ref 0.0–0.5)
EOS%: 4.1 % (ref 0.0–7.0)
HCT: 38 % (ref 34.8–46.6)
HGB: 12.3 g/dL (ref 11.6–15.9)
LYMPH%: 24.4 % (ref 14.0–49.7)
MCH: 30.8 pg (ref 25.1–34.0)
MCHC: 32.3 g/dL (ref 31.5–36.0)
MCV: 95.3 fL (ref 79.5–101.0)
MONO#: 0.7 10*3/uL (ref 0.1–0.9)
MONO%: 7.3 % (ref 0.0–14.0)
NEUT%: 63.5 % (ref 38.4–76.8)
NEUTROS ABS: 5.7 10*3/uL (ref 1.5–6.5)
PLATELETS: 273 10*3/uL (ref 145–400)
RBC: 3.98 10*6/uL (ref 3.70–5.45)
RDW: 13.9 % (ref 11.2–14.5)
WBC: 8.9 10*3/uL (ref 3.9–10.3)
lymph#: 2.2 10*3/uL (ref 0.9–3.3)

## 2014-02-12 LAB — COMPREHENSIVE METABOLIC PANEL (CC13)
ALBUMIN: 4.1 g/dL (ref 3.5–5.0)
ALK PHOS: 80 U/L (ref 40–150)
ALT: 40 U/L (ref 0–55)
ANION GAP: 9 meq/L (ref 3–11)
AST: 38 U/L — ABNORMAL HIGH (ref 5–34)
BUN: 11.4 mg/dL (ref 7.0–26.0)
CALCIUM: 9.7 mg/dL (ref 8.4–10.4)
CO2: 28 meq/L (ref 22–29)
Chloride: 107 mEq/L (ref 98–109)
Creatinine: 0.8 mg/dL (ref 0.6–1.1)
GLUCOSE: 109 mg/dL (ref 70–140)
POTASSIUM: 4.2 meq/L (ref 3.5–5.1)
Sodium: 144 mEq/L (ref 136–145)
TOTAL PROTEIN: 7.1 g/dL (ref 6.4–8.3)
Total Bilirubin: 0.46 mg/dL (ref 0.20–1.20)

## 2014-02-12 MED ORDER — ANASTROZOLE 1 MG PO TABS: 1.0000 mg | ORAL_TABLET | Freq: Every day | ORAL | Status: DC

## 2014-02-12 NOTE — Progress Notes (Signed)
Radiation Oncology         [phone] ________________________________  Name: Jessica Bautista MRN: [id number]  Date: 02/12/2014  DOB: [birth year]  Follow-Up Visit Note  outpatient  CC: Gennette Pac, MD  Magrinat, Virgie Dad, MD  Diagnosis and Prior Radiotherapy: Stage I pT1c Nx Right breast cancer, ER/PR+ Her2 (-)  Narrative:  The patient returns today for routine follow-up.  We discussed her at tumor board this AM. Her margins are close but clear after lumpectomy.     The tumor was 1.5 cm.    She is going to receive anti-estrogen therapy via Dr Jana Hakim. She has no new complaints.                     ALLERGIES:  is allergic to tape.  Meds: Current Outpatient Prescriptions  Medication Sig Dispense Refill  . escitalopram (LEXAPRO) 10 MG tablet Take 10 mg by mouth daily.       Marland Kitchen lisinopril (PRINIVIL,ZESTRIL) 10 MG tablet Take 10 mg by mouth daily.      . pravastatin (PRAVACHOL) 40 MG tablet Take 20 mg by mouth daily.       Marland Kitchen SYNTHROID 112 MCG tablet Take 112 mcg by mouth daily before breakfast.       . zolpidem (AMBIEN) 10 MG tablet 10 mg at bedtime as needed.       Marland Kitchen anastrozole (ARIMIDEX) 1 MG tablet Take 1 tablet (1 mg total) by mouth daily.  90 tablet  3   No current facility-administered medications for this encounter.    Physical Findings: The patient is in no acute distress. Patient is alert and oriented.  height is $RemoveB'5\' 4"'vnoDwPll$  (1.626 m) and weight is 203 lb 4.8 oz (92.216 kg). Her temperature is 98.3 F (36.8 C). Her blood pressure is 140/76 and her pulse is 75. .    Further exam deferred. Well appearing woman, NAD  Lab Findings: Lab Results  Component Value Date   WBC 8.9 02/12/2014   HGB 12.3 02/12/2014   HCT 38.0 02/12/2014   MCV 95.3 02/12/2014   PLT 273 02/12/2014    Radiographic Findings: Mm Breast Surgical Specimen  01/27/2014   CLINICAL DATA:  Preoperative needle localization for right upper outer quadrant invasive mammary carcinoma.  EXAM: NEEDLE LOCALIZATION OF  THE right BREAST WITH MAMMO GUIDANCE and specimen imaging  COMPARISON:  Previous exams.  FINDINGS: Patient presents for needle localization prior to lumpectomy. I met with the patient and we discussed the procedure of needle localization including benefits and alternatives. We discussed the high likelihood of a successful procedure. We discussed the risks of the procedure, including infection, bleeding, tissue injury, and further surgery. Informed, written consent was given. The usual time-out protocol was performed immediately prior to the procedure.  Using mammographic guidance, sterile technique, 2% lidocaine and a 7 cm modified Kopans needle, the ribbon shaped clip was localized using a lateral to medial approach. The films were marked for Dr. Excell Seltzer.  Specimen radiograph was performed at day surgery and confirms the intact hookwire and ribbon shaped clip are present in the tissue sample. The specimen was marked for pathology.  IMPRESSION: Needle localization right breast. No apparent complications.   Electronically Signed   By: Conchita Paris M.D.   On: 01/27/2014 14:39   Mm Rt Plc Breast Loc Dev   1st Lesion  Inc Mammo Guide  01/27/2014   CLINICAL DATA:  Preoperative needle localization for right upper outer quadrant invasive mammary carcinoma.  EXAM:  NEEDLE LOCALIZATION OF THE right BREAST WITH MAMMO GUIDANCE and specimen imaging  COMPARISON:  Previous exams.  FINDINGS: Patient presents for needle localization prior to lumpectomy. I met with the patient and we discussed the procedure of needle localization including benefits and alternatives. We discussed the high likelihood of a successful procedure. We discussed the risks of the procedure, including infection, bleeding, tissue injury, and further surgery. Informed, written consent was given. The usual time-out protocol was performed immediately prior to the procedure.  Using mammographic guidance, sterile technique, 2% lidocaine and a 7 cm modified  Kopans needle, the ribbon shaped clip was localized using a lateral to medial approach. The films were marked for Dr. Excell Seltzer.  Specimen radiograph was performed at day surgery and confirms the intact hookwire and ribbon shaped clip are present in the tissue sample. The specimen was marked for pathology.  IMPRESSION: Needle localization right breast. No apparent complications.   Electronically Signed   By: Conchita Paris M.D.   On: 01/27/2014 14:39    Impression/Plan:  We re-discussed the role of RT in the setting of ER+ Stage I breast cancer for women 53+ years of age after lumpectomy today.  She understands RT could carry a small local control benefit (~7% decrease in risk of breast recurrence over a decade) but no improvement in life expectancy if added to anti estrogen therapy.  She would like to pursue anti estrogen therapy alone. This is a fine choice. I wished her the best. I will see her back PRN.  _____________________________________   Eppie Gibson, MD

## 2014-02-12 NOTE — [facility] Oncology Navigator Note (Signed)
Patient at [facility] for follow up appointments with Dr. Isidore Moos and Dr. Jana Hakim.  Patient reports that she is doing well.  After her discussion with Dr. Isidore Moos, she has decided to not have radiation therapy treatments.  She discussed beginning anti-estrogen therapy with Dr. Jana Hakim.  We discussed that she should continue with her annual mammograms. She denied any other questions or concerns at this time.  I encouraged her to call me for any needs.

## 2014-02-12 NOTE — Progress Notes (Addendum)
Location of Breast Cancer:Right Breast, 9:30 O'clock - Upper Outer Quadrant  Histology per Pathology Report:   01/27/14  Diagnosis  1. Breast, lumpectomy, Right  - INVASIVE LOBULAR CARCINOMA, SEE COMMENT.  - INVASIVE TUMOR IS LESS THAN 0.1 MM FROM NEAREST MARGIN (MEDIAL).  - NEGATIVE FOR LYMPH VASCULAR INVASION.  - LOBULAR NEOPLASIA (IN SITU CARCINOMA AND ATYPICAL HYPERPLASIA).  - PREVIOUS BIOPSY SITE.  - SEE TUMOR SYNOPTIC TEMPLATE BELOW.  2. Breast, excision, Right, posterior margin  - LOBULAR NEOPLASIA (ATYPICAL LOBULAR HYPERPLASIA), SEE COMMENT.  1 of 4  FINAL for VELENCIA, LENART ([id number])  Diagnosis(continued)  3. Breast, excision, Right, medial margin  - LOBULAR NEOPLASIA (IN SITU CARCINOMA AND ATYPICAL LOBULAR HYPERPLASIA), SEE  COMMENT.  4. Breast, excision, Right, anterior margin  - LOBULAR NEOPLASIA (ATYPICAL LOBULAR HYPERPLASIA  01/08/14  Diagnosis  Breast, right, needle core biopsy, 9:30 o'clock, 7 cm / nipple)  - INVASIVE MAMMARY CARCINOMA WITH PAPILLARY FEATURES.  - SEE COMMENT.  Receptor Status: ER100), PR (100), Her2-neu (No Amplification), Ki-67(19%)   Breast-  tumor found on mammogram  Past/Anticipated interventions by surgeon, if any; Lumpectomy Right Breast  Past/Anticipated interventions by medical oncology, if any: Chemotherapy ?   Lymphedema issues, if any: NONE  Pain issues, if any:    Occassional shooting pains in her right breast with achy/throbbing sensations   SAFETY ISSUES:    Prior radiation?No Pacemaker/ICD? NO Possible current pregnancy?NO Is the patient on methotrexate? {yes/no:[id number]  Current Complaints / other details:           Khaden Gater, Crista Curb, RN  02/11/2014,1:58 PM

## 2014-02-12 NOTE — Progress Notes (Signed)
Jessica Bautista  [phone] [phone]     ID: Jessica Bautista OB: April 22, 2000  MR#: [id number]  VOZ#:[id number]  PCP: Jessica Pac, MD GYN:   SU: Excell Seltzer OTHER MD: Jessica Bautista  CHIEF COMPLAINT: "My mammogram showed breast cancer"  BREAST CANCER HISTORY: Jessica Bautista had routine bilateral screening mammography at the breast Center to 01/24/2014. A possible mass in the right breast was noted, and she was recalled for a right diagnostic mammogram and ultrasound 01-2014. This showed an irregular 8 mm mass in the lateral right breast, with a second more circumscribed 7 mm mass inferiorly, which was stable. There was no palpable mass by exam. Ultrasound showed a 7 mm irregular hypoechoic mass in the right breast upper outer quadrant. The second area noted on mammography was a simple cyst. There was no right axillary adenopathy.  Biopsy of the questionable mass 01/08/2014 showed ([id number]) an invasive ductal carcinoma with papillary features, grade 2estrogen and progesterone receptor positive, with an MIB-1 of 15 and no HER-2 amplification, the signals ratio being 1.19 in the number per cell 1.55.   The patient's subsequent history is as detailed below  INTERVAL HISTORY: [name] returns today for followup of her breast cancer. Since her last visit here she underwent definitive right lumpectomy without sentinel lymph node biopsy 01/27/2014. The final results ([id number]) showed an invasive lobular breast cancer measuring 1.5 cm, grade 2, less than a millimeter to the medial margin. There was also in situ lobular neoplasia which was also within a millimeter of the medial margin. Repeat HER-2 was again negative.  The patient's case was presented at the multidisciplinary breast cancer conference this morning. The consensus was that the patient should consider radiation but in any case would benefit from antiestrogen therapy. Chemotherapy was not recommended in  this small clinically node negative lobular breast cancer.  REVIEW OF SYSTEMS: Rease did fine with her surgery, and specifically she had mild discomfort, for which she took Aleve only, no fever, no bleeding, no redness or swelling. She has mild seasonal allergies issues. Of course she has psoriasis. Sometimes she feels a little bit forgetful. She has arthritis pains "here and there" which are not more persistent or intense than prior. Overall a detailed review of systems today was noncontributory  PAST MEDICAL HISTORY: Past Medical History  Diagnosis Date  . Hypertension   . Thyroid disease   . Skin cancer   . Hyperlipidemia   . Psoriasis   . Pulmonary embolus     post op tummy tuc  . Wears glasses     PAST SURGICAL HISTORY: Past Surgical History  Procedure Laterality Date  . Ankle fracture surgery  1998    right  . Wrist fracture surgery  1975    right  . Abdominoplasty  1998  . Tubal ligation    . Colonoscopy    . Breast lumpectomy with needle localization Right 01/27/2014    Procedure: BREAST LUMPECTOMY WITH NEEDLE LOCALIZATION;  Surgeon: Edward Jolly, MD;  Location: [facility];  Service: General;  Laterality: Right;    FAMILY HISTORY No family history on file. The patient's father died at the age of 65 with multiple comorbidities including diabetes heart disease and COPD. The patient's mother died at the age of 74. The patient had 3 brothers, one of whom died from lung cancer at the age of 58. He was a smoker. She had 2 sisters. There is no history of breast or ovarian cancer in the  family.  GYNECOLOGIC HISTORY:  Menarche age 58, first live birth age 57, [name]. She stopped having periods "about 20 years ago". She did not take hormone replacement. 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 Her husband Jessica Bautista, is currently 20 years old. "He mostly  stays at home and watch a sports". Son been lives in [place] and is in pacemaker sales. Daughter Jessica Bautista lives in [place] where she works for a Herbalist areas. Daughter Jessica Bautista heritage lives in [place] and is an Glass blower/designer. Son Jessica Bautista lives in [place] and is a Probation officer. Son Jessica Bautista also lives in [place], and works in plumbing. The patient has multiple grandchildren. She attends [employer]    ADVANCED DIRECTIVES: In place   HEALTH MAINTENANCE: History  Substance Use Topics  . Smoking status: Former Smoker -- 1.00 packs/day    Types: Cigarettes    Quit date: 01/23/1983  . Smokeless tobacco: Not on file  . Alcohol Use: 1.8 oz/week    3 Glasses of wine per week     Colonoscopy:  PAP:  Bone density: 11/18/2013 at [facility], lowest T score of -0.7 (normal) at the right femoral neck  Lipid panel:  Allergies  Allergen Reactions  . Tape Itching and Rash    Adhesive Tape    Current Outpatient Prescriptions  Medication Sig Dispense Refill  . escitalopram (LEXAPRO) 10 MG tablet Take 10 mg by mouth daily.       Marland Kitchen lisinopril (PRINIVIL,ZESTRIL) 10 MG tablet Take 10 mg by mouth daily.      . pravastatin (PRAVACHOL) 40 MG tablet Take 20 mg by mouth daily.       Marland Kitchen SYNTHROID 112 MCG tablet Take 112 mcg by mouth daily before breakfast.       . zolpidem (AMBIEN) 10 MG tablet 10 mg at bedtime as needed.        No current facility-administered medications for this visit.    OBJECTIVE: Middle-aged white woman who appears stated age 73 Vitals:   02/12/14 1520  BP: 145/76  Pulse: 72  Temp: 97.8 F (36.6 C)  Resp: 18     Body mass index is 34.78 kg/(m^2).    ECOG FS:0 - Asymptomatic  Ocular: Sclerae unicteric, EOMs intact Ear-nose-throat: Oropharynx clear and moist Lymphatic: No cervical or supraclavicular adenopathy Lungs no rales or rhonchi, good excursion bilaterally Heart regular rate and rhythm, no murmur  appreciated Abd soft, nontender, positive bowel sounds MSK no focal spinal tenderness, no joint edema Neuro: non-focal, well-oriented, positive affect Breasts: The right breast is status post lumpectomy. The cosmetic result is excellent. There is some induration inferior to the scar, but no dehiscence, erythema, or tenderness. The right axilla is benign. The left breast is unremarkable   LAB RESULTS:  CMP     Component Value Date/Time   NA 144 02/12/2014 1503   NA 141 01/27/2011 1923   K 4.2 02/12/2014 1503   K 4.4 01/27/2011 1923   CL 108 01/27/2011 1923   CO2 28 02/12/2014 1503   CO2 26 01/27/2011 1923   GLUCOSE 109 02/12/2014 1503   GLUCOSE 108* 01/27/2011 1923   BUN 11.4 02/12/2014 1503   BUN 17 01/27/2011 1923   CREATININE 0.8 02/12/2014 1503   CREATININE 0.79 01/27/2011 1923   CALCIUM 9.7 02/12/2014 1503   CALCIUM 9.2 01/27/2011 1923  PROT 7.1 02/12/2014 1503   PROT 7.3 01/27/2011 1923   ALBUMIN 4.1 02/12/2014 1503   ALBUMIN 4.3 01/27/2011 1923   AST 38* 02/12/2014 1503   AST 64* 01/27/2011 1923   ALT 40 02/12/2014 1503   ALT 89* 01/27/2011 1923   ALKPHOS 80 02/12/2014 1503   ALKPHOS 68 01/27/2011 1923   BILITOT 0.46 02/12/2014 1503   BILITOT 0.2* 01/27/2011 1923   GFRNONAA >60 01/27/2011 1923   GFRAA  Value: >60        The eGFR has been calculated using the MDRD equation. This calculation has not been validated in all clinical situations. eGFR's persistently <60 mL/min signify possible Chronic Kidney Disease. 01/27/2011 1923    I No results found for this basename: SPEP,  UPEP,   kappa and lambda light chains    Lab Results  Component Value Date   WBC 8.9 02/12/2014   NEUTROABS 5.7 02/12/2014   HGB 12.3 02/12/2014   HCT 38.0 02/12/2014   MCV 95.3 02/12/2014   PLT 273 02/12/2014      Chemistry      Component Value Date/Time   NA 144 02/12/2014 1503   NA 141 01/27/2011 1923   K 4.2 02/12/2014 1503   K 4.4 01/27/2011 1923   CL 108 01/27/2011 1923   CO2 28 02/12/2014 1503   CO2 26 01/27/2011 1923   BUN 11.4  02/12/2014 1503   BUN 17 01/27/2011 1923   CREATININE 0.8 02/12/2014 1503   CREATININE 0.79 01/27/2011 1923      Component Value Date/Time   CALCIUM 9.7 02/12/2014 1503   CALCIUM 9.2 01/27/2011 1923   ALKPHOS 80 02/12/2014 1503   ALKPHOS 68 01/27/2011 1923   AST 38* 02/12/2014 1503   AST 64* 01/27/2011 1923   ALT 40 02/12/2014 1503   ALT 89* 01/27/2011 1923   BILITOT 0.46 02/12/2014 1503   BILITOT 0.2* 01/27/2011 1923       No results found for this basename: LABCA2    No components found with this basename: LABCA125    No results found for this basename: INR,  in the last 168 hours  Urinalysis No results found for this basename: colorurine,  appearanceur,  labspec,  phurine,  glucoseu,  hgbur,  bilirubinur,  ketonesur,  proteinur,  urobilinogen,  nitrite,  leukocytesur    STUDIES: Mm Breast Surgical Specimen  01/27/2014   CLINICAL DATA:  Preoperative needle localization for right upper outer quadrant invasive mammary carcinoma.  EXAM: NEEDLE LOCALIZATION OF THE right BREAST WITH MAMMO GUIDANCE and specimen imaging  COMPARISON:  Previous exams.  FINDINGS: Patient presents for needle localization prior to lumpectomy. I met with the patient and we discussed the procedure of needle localization including benefits and alternatives. We discussed the high likelihood of a successful procedure. We discussed the risks of the procedure, including infection, bleeding, tissue injury, and further surgery. Informed, written consent was given. The usual time-out protocol was performed immediately prior to the procedure.  Using mammographic guidance, sterile technique, 2% lidocaine and a 7 cm modified Kopans needle, the ribbon shaped clip was localized using a lateral to medial approach. The films were marked for Dr. Excell Seltzer.  Specimen radiograph was performed at day surgery and confirms the intact hookwire and ribbon shaped clip are present in the tissue sample. The specimen was marked for pathology.  IMPRESSION:  Needle localization right breast. No apparent complications.   Electronically Signed   By: Conchita Paris M.D.   On: 01/27/2014 14:39  Mm Rt Plc Breast Loc Dev   1st Lesion  Inc Mammo Guide  01/27/2014   CLINICAL DATA:  Preoperative needle localization for right upper outer quadrant invasive mammary carcinoma.  EXAM: NEEDLE LOCALIZATION OF THE right BREAST WITH MAMMO GUIDANCE and specimen imaging  COMPARISON:  Previous exams.  FINDINGS: Patient presents for needle localization prior to lumpectomy. I met with the patient and we discussed the procedure of needle localization including benefits and alternatives. We discussed the high likelihood of a successful procedure. We discussed the risks of the procedure, including infection, bleeding, tissue injury, and further surgery. Informed, written consent was given. The usual time-out protocol was performed immediately prior to the procedure.  Using mammographic guidance, sterile technique, 2% lidocaine and a 7 cm modified Kopans needle, the ribbon shaped clip was localized using a lateral to medial approach. The films were marked for Dr. Excell Seltzer.  Specimen radiograph was performed at day surgery and confirms the intact hookwire and ribbon shaped clip are present in the tissue sample. The specimen was marked for pathology.  IMPRESSION: Needle localization right breast. No apparent complications.   Electronically Signed   By: Conchita Paris M.D.   On: 01/27/2014 14:39  ronically Signed: By: Ulyess Blossom M.D. On: 01/08/2014 10:09    ASSESSMENT: 73 y.o. [place] woman status post right breast biopsy 01/08/2014 for a clinical T1b N0, stage IA invasive ductal carcinoma, grade 2, estrogen and progesterone receptor positive, with an MIB-1 of 15% and no HER-2 amplification  (1) s/p Right lumpectomy 01/27/2014 for a pT1c NX, stage I invasive lobular carcinoma, HER-2 again negative, margins close but negative  (2) the consensus at the multidisciplinary  conference 02/12/2014 was that radiation was optional; the patient discuss this with her radiation oncologist and opted against radiation  (3) anastrozole to start April 2015   PLAN: We reviewed Massie's situation in detail today and I went ahead and gave her a copy of all her pathology reports as well. I am very comfortable with her not receiving radiation, since we have data that in patients like her home will take anti-estrogens for 5 years omitting radiation does not compromise survival and minimally compromises local recurrence.  We then discussed the difference between tamoxifen and anastrozole. She just had a normal bone density and I think anastrozole would be the best choice for her. She understands the difference between 5 years of anastrozole and 5 years of tamoxifen, particularly. We did discuss the possible toxicities, side effects and complications of this agent and she will call if any unusual problems develop.  Otherwise she will see me again in 3 months. If she is tolerating anastrozole well, the plan will be for 5 years from this medication. We will repeat a bone density in 2 years. She will continue her vitamin D supplementation and her weight bearing exercise which is mostly [place] and walking.  She has a good understanding of your overall plan, and agrees with it. She knows a goal of treatment in her cases cure. She will call with any problems that may develop before next visit.    Chauncey Cruel, MD   02/12/2014 4:07 PM

## 2014-02-13 ENCOUNTER — Telehealth: Payer: Self-pay | Admitting: Oncology

## 2014-02-13 ENCOUNTER — Other Ambulatory Visit: Payer: Self-pay | Admitting: *Deleted

## 2014-02-13 MED ORDER — ANASTROZOLE 1 MG PO TABS: 1.0000 mg | ORAL_TABLET | Freq: Every day | ORAL | Status: DC

## 2014-02-13 NOTE — Telephone Encounter (Signed)
s.w.l pt and advised on July appt....pt ok adn aware

## 2014-02-14 ENCOUNTER — Encounter (INDEPENDENT_AMBULATORY_CARE_PROVIDER_SITE_OTHER): Payer: Self-pay | Admitting: General Surgery

## 2014-02-14 ENCOUNTER — Ambulatory Visit (INDEPENDENT_AMBULATORY_CARE_PROVIDER_SITE_OTHER): Payer: Medicare Other | Admitting: General Surgery

## 2014-02-14 VITALS — BP 118/80 | HR 76 | Temp 98.5°F | Ht 64.0 in | Wt 203.0 lb

## 2014-02-14 DIAGNOSIS — C50411 Malignant neoplasm of upper-outer quadrant of right female breast: Secondary | ICD-10-CM

## 2014-02-14 DIAGNOSIS — C50419 Malignant neoplasm of upper-outer quadrant of unspecified female breast: Secondary | ICD-10-CM

## 2014-02-14 NOTE — Progress Notes (Signed)
Chief complaint: Followup lobectomy  History: Patient returns for followup for right breast lumpectomy performed 2 weeks ago. She reports she is doing well with no concerns.  Exam: BP 118/80  Pulse 76  Temp(Src) 98.5 F (36.9 C) (Oral)  Ht 5\' 4"  (1.626 m)  Wt 203 lb (92.08 kg)  BMI 34.83 kg/m2 General: Appears well Breasts: Excision right breast healing nicely with probable small seroma in mild to moderate induration. There is some faint periareolar erythema.  We had previously reviewed her pathology showing invasive lobular carcinoma close to the medial margin but no tumor on 8 and was associated LCIS. This was grade 2 no lymphovascular invasion and measured 1.5 cm. No lymph node sampling was done.  Assessment and plan: Doing well following lobectomy as above. She has been started on Arimidex and will be treated with hormones alone. I will see her for long-term followup in 6 months.

## 2014-02-14 NOTE — Addendum Note (Signed)
Encounter addended by: Deirdre Evener, RN on: 02/14/2014 11:05 AM<BR>     Documentation filed: Charges VN, Notes Section, Visit Diagnoses

## 2014-02-19 ENCOUNTER — Ambulatory Visit: Payer: Medicare Other | Admitting: Radiation Oncology

## 2014-02-19 ENCOUNTER — Ambulatory Visit: Payer: Medicare Other

## 2014-02-26 ENCOUNTER — Encounter: Payer: Self-pay | Admitting: *Deleted

## 2014-02-26 NOTE — [facility] Oncology Navigator Note (Signed)
I called patient to check in.  She reports that she is doing great.  She is taking her Anastrozole and tolerating well.  She denied any questions or concerns at this time.  We reviewed her upcoming appointments.  I encouraged her to call me for any needs.

## 2014-03-11 ENCOUNTER — Encounter: Payer: Self-pay | Admitting: Gynecology

## 2014-03-11 ENCOUNTER — Encounter (INDEPENDENT_AMBULATORY_CARE_PROVIDER_SITE_OTHER): Payer: Medicare Other | Admitting: Gynecology

## 2014-03-12 NOTE — Progress Notes (Signed)
Reschedule

## 2014-03-18 ENCOUNTER — Ambulatory Visit (INDEPENDENT_AMBULATORY_CARE_PROVIDER_SITE_OTHER): Payer: Medicare Other | Admitting: Gynecology

## 2014-03-18 ENCOUNTER — Encounter: Payer: Self-pay | Admitting: Gynecology

## 2014-03-18 ENCOUNTER — Other Ambulatory Visit (HOSPITAL_COMMUNITY)
Admission: RE | Admit: 2014-03-18 | Discharge: 2014-03-18 | Disposition: A | Discharge status: Home / Self Care | Payer: Medicare Other | Source: Ambulatory Visit | Attending: Gynecology | Admitting: Gynecology

## 2014-03-18 VITALS — BP 128/86 | Ht 64.5 in | Wt 202.0 lb

## 2014-03-18 DIAGNOSIS — Z124 Encounter for screening for malignant neoplasm of cervix: Secondary | ICD-10-CM

## 2014-03-18 DIAGNOSIS — Z853 Personal history of malignant neoplasm of breast: Secondary | ICD-10-CM

## 2014-03-18 DIAGNOSIS — N952 Postmenopausal atrophic vaginitis: Secondary | ICD-10-CM

## 2014-03-18 DIAGNOSIS — Z780 Asymptomatic menopausal state: Secondary | ICD-10-CM

## 2014-03-18 DIAGNOSIS — Z1151 Encounter for screening for human papillomavirus (HPV): Secondary | ICD-10-CM | POA: Insufficient documentation

## 2014-03-18 DIAGNOSIS — N9089 Other specified noninflammatory disorders of vulva and perineum: Secondary | ICD-10-CM

## 2014-03-18 DIAGNOSIS — N951 Menopausal and female climacteric states: Secondary | ICD-10-CM

## 2014-03-18 NOTE — Patient Instructions (Addendum)
Lichen Sclerosus et Atrophicus or also called [facility]

## 2014-03-18 NOTE — Progress Notes (Signed)
Jessica Bautista [birth year] [id number]   History:    73 y.o.  new patient to the practice who stated that she has not seen a gynecologist in over 6 years. Her primary care physician is Dr. Rex Kras at [facility] who has been doing her blood work. Patient was diagnosed with breast cancer this year which was detected on a screening mammogram as follows:  status post right breast biopsy 01/08/2014 for a clinical T1b N0, stage IA invasive ductal carcinoma, grade 2, estrogen and progesterone receptor positive, with an MIB-1 of 15% and no HER-2 amplification  (1) s/p Right lumpectomy 01/27/2014 for a pT1c NX, stage I invasive lobular carcinoma, HER-2 again negative, margins close but negative.  the consensus at the multidisciplinary conference 02/12/2014 was that radiation was optional; the patient opted against radiation and was started on anastrozole to start April 2015    Patient stated that she had a colonoscopy in 2010 benign polyps were removed. Her gastroenterologist is Dr. Amedeo Plenty. Patient stated that over 30 years ago she had severe cervical dysplasia and had cervical conization. Subsequent Pap smear until 2009 have been normal. Patient had a normal bone density study this year. Her shingles vaccine and Tdap vaccine are both up-to-date.   Past medical history,surgical history, family history and social history were all reviewed and documented in the EPIC chart.  Gynecologic History No LMP recorded. Patient is postmenopausal. Contraception: post menopausal status Last Pap: 2009. Results were: normal Last mammogram: 2015. Results were: See above  Obstetric History OB History  Gravida Para Term Preterm AB SAB TAB Ectopic Multiple Living  _0 # Outcome Date GA Lbr Len/2nd Weight Sex Delivery Anes PTL Lv  5 PAR           4 PAR           3 PAR           2 PAR           1 PAR                ROS: A ROS was performed and pertinent positives and negatives are  included in the history.  GENERAL: No fevers or chills. HEENT: No change in vision, no earache, sore throat or sinus congestion. NECK: No pain or stiffness. CARDIOVASCULAR: No chest pain or pressure. No palpitations. PULMONARY: No shortness of breath, cough or wheeze. GASTROINTESTINAL: No abdominal pain, nausea, vomiting or diarrhea, melena or bright red blood per rectum. GENITOURINARY: No urinary frequency, urgency, hesitancy or dysuria. MUSCULOSKELETAL: No joint or muscle pain, no back pain, no recent trauma. DERMATOLOGIC: No rash, no itching, no lesions. ENDOCRINE: No polyuria, polydipsia, no heat or cold intolerance. No recent change in weight. HEMATOLOGICAL: No anemia or easy bruising or bleeding. NEUROLOGIC: No headache, seizures, numbness, tingling or weakness. PSYCHIATRIC: No depression, no loss of interest in normal activity or change in sleep pattern.     Exam: chaperone present  BP 128/86  Ht 5' 4.5" (1.638 m)  Wt 202 lb (91.627 kg)  BMI 34.15 kg/m2  Body mass index is 34.15 kg/(m^2).  General appearance : Well developed well nourished female. No acute distress HEENT: Neck supple, trachea midline, no carotid bruits, no thyroidmegaly Lungs: Clear to auscultation, no rhonchi or wheezes, or rib retractions  Heart: Regular rate and rhythm, no murmurs or gallops Breast:Examined in sitting and supine position were symmetrical in appearance, no palpable masses  or tenderness,  no skin retraction, no nipple inversion, no nipple discharge, no skin discoloration, no axillary or supraclavicular lymphadenopathy Abdomen: no palpable masses or tenderness, no rebound or guarding Extremities: no edema or skin discoloration or tenderness  Pelvic: Physical Exam  Genitourinary:        Bartholin, Urethra, Skene Glands: Within normal limits             Vagina: No gross lesions or discharge  Cervix: No gross lesions or discharge  Uterus  anteverted, normal size, shape and consistency, non-tender  and mobile  Adnexa  Without masses or tenderness  Anus and perineum  normal   Rectovaginal  normal sphincter tone without palpated masses or tenderness             Hemoccult PCP provides     Assessment/Plan:  73 y.o. female with history of right lumpectomy 01/27/2014 for stage I invasive lobular cancer currently on Arimedex and being followed by her medical oncologist. Incidental finding today depigmented area of the external genitalia highly suspicious for lichen sclerosis. Patient returned back to the office in 1-2 weeks for a detailed colposcopic evaluation and biopsy. A Pap smear was done today with HPV screen. PCP will be drawn her blood work. She was reminded of the importance of calcium vitamin D and regular exercise for osteoporosis prevention.   Note: This dictation was prepared with  Dragon/digital dictation along withSmart phrase technology. Any transcriptional errors that result from this process are unintentional.   Terrance Mass MD, 8:32 AM 03/18/2014

## 2014-04-14 ENCOUNTER — Telehealth: Payer: Self-pay | Admitting: *Deleted

## 2014-04-14 NOTE — Telephone Encounter (Signed)
Called to r/s f/u appt with Dr. Jana Hakim.  Confirmed appt on 06/11/14 at 1300 lab, 1330 with Dr. Jana Hakim.

## 2014-04-15 ENCOUNTER — Encounter: Payer: Self-pay | Admitting: Gynecology

## 2014-04-15 ENCOUNTER — Ambulatory Visit (INDEPENDENT_AMBULATORY_CARE_PROVIDER_SITE_OTHER): Payer: Medicare Other | Admitting: Gynecology

## 2014-04-15 VITALS — BP 116/70

## 2014-04-15 DIAGNOSIS — N9089 Other specified noninflammatory disorders of vulva and perineum: Secondary | ICD-10-CM

## 2014-04-15 NOTE — Addendum Note (Signed)
Addended by: Thurnell Garbe A on: 04/15/2014 11:35 AM   Modules accepted: Orders

## 2014-04-15 NOTE — Patient Instructions (Addendum)
Clobetasol Propionate Topical Ointment What is this medicine? CLOBETASOL (kloe BAY ta sol) is a corticosteroid. It is used on the skin to treat itching, redness, and swelling caused by some skin conditions. This medicine may be used for other purposes; ask your health care provider or pharmacist if you have questions. COMMON BRAND NAME(S): Cormax, Embeline, Temovate E, Temovate What should I tell my health care provider before I take this medicine? They need to know if you have any of these conditions: -any type of active infection including measles, tuberculosis, herpes, or chickenpox -circulation problems or vascular disease -large areas of burned or damaged skin -rosacea -skin wasting or thinning -an unusual or allergic reaction to clobetasol, corticosteroids, other medicines, foods, dyes, or preservatives -pregnant or trying to get pregnant -breast-feeding How should I use this medicine? This medicine is for external use only. Do not take by mouth. Follow the directions on the prescription label. Wash your hands before and after use. Apply a thin film of medicine to the affected area. Do not cover with a bandage or dressing unless your doctor or health care professional tells you to. Do not get this medicine in your eyes. If you do, rinse out with plenty of cool tap water. It is important not to use more medicine than prescribed. Do not use your medicine more often than directed. To do so may increase the chance of side effects. Talk to your pediatrician regarding the use of this medicine in children. Special care may be needed. Elderly patients are more likely to have damaged skin through aging, and this may increase side effects. This medicine should only be used for brief periods and infrequently in older patients. Overdosage: If you think you have taken too much of this medicine contact a poison control center or emergency room at once. NOTE: This medicine is only for you. Do not share  this medicine with others. What if I miss a dose? If you miss a dose, use it as soon as you can. If it is almost time for your next dose, use only that dose. Do not use double or extra doses without advice. What may interact with this medicine? Interactions are not expected. Do not use cosmetics or other skin care products on the treated area. This list may not describe all possible interactions. Give your health care provider a list of all the medicines, herbs, non-prescription drugs, or dietary supplements you use. Also tell them if you smoke, drink alcohol, or use illegal drugs. Some items may interact with your medicine. What should I watch for while using this medicine? Tell your doctor or health care professional if your symptoms do not get better within 2 weeks, or if you develop skin irritation from the medicine. Tell your doctor or health care professional if you are exposed to anyone with measles or chickenpox, or if you develop sores or blisters that do not heal properly. What side effects may I notice from receiving this medicine? Side effects that you should report to your doctor or health care professional as soon as possible: -allergic reactions like skin rash, itching or hives, swelling of the face, lips, or tongue -changes in vision -lack of healing of the skin condition -painful, red, pus filled blisters on the skin or in hair follicles -thinning of the skin with easy bruising Side effects that usually do not require medical attention (report to your doctor or health care professional if they continue or are bothersome): -burning, irritation of the skin -redness or  scaling of the skin This list may not describe all possible side effects. Call your doctor for medical advice about side effects. You may report side effects to FDA at 1-800-FDA-1088. Where should I keep my medicine? Keep out of the reach of children. Store at room temperature between 15 and 30 degrees C (59 and 86  degrees F). Keep away from heat and direct light. Do not freeze. Throw away any unused medicine after the expiration date. NOTE: This sheet is a summary. It may not cover all possible information. If you have questions about this medicine, talk to your doctor, pharmacist, or health care provider.  2014, Elsevier/Gold Standard. (2008-02-06 17:22:39) Colposcopy Colposcopy is a procedure to examine your cervix and vagina, or the area around the outside of your vagina, for abnormalities or signs of disease. The procedure is done using a lighted microscope called a colposcope. Tissue samples may be collected during the colposcopy if your health care provider finds any unusual cells. A colposcopy may be done if a woman has:  An abnormal Pap test. A Pap test is a medical test done to evaluate cells that are on the surface of the cervix.  A Pap test result that is suggestive of human papillomavirus (HPV). This virus can cause genital warts and is linked to the development of cervical cancer.  A sore on her cervix and the results of a Pap test were normal.  Genital warts on the cervix or in or around the outside of the vagina.  A mother who took the drug diethylstilbestrol (DES) while pregnant.  Painful intercourse.  Vaginal bleeding, especially after sexual intercourse. LET [facility] CARE PROVIDER KNOW ABOUT:  Any allergies you have.  All medicines you are taking, including vitamins, herbs, eye drops, creams, and over-the-counter medicines.  Previous problems you or members of your family have had with the use of anesthetics.  Any blood disorders you have.  Previous surgeries you have had.  Medical conditions you have. RISKS AND COMPLICATIONS Generally, a colposcopy is a safe procedure. However, as with any procedure, complications can occur. Possible complications include:  Bleeding.  Infection.  Missed lesions. BEFORE THE PROCEDURE   Tell your health care provider if you have  your menstrual period. A colposcopy typically is not done during menstruation.  For 24 hours before the colposcopy, do not:  [place].  Use tampons.  Use medicines, creams, or suppositories in the vagina.  Have sexual intercourse. PROCEDURE  During the procedure, you will be lying on your back with your feet in foot rests (stirrups). A warm metal or plastic instrument (speculum) will be placed in your vagina to keep it open and to allow the health care provider to see the cervix. The colposcope will be placed outside the vagina. It will be used to magnify and examine the cervix, vagina, and the area around the outside of the vagina. A small amount of liquid solution will be placed on the area that is to be viewed. This solution will make it easier to see the abnormal cells. Your health care provider will use tools to suck out mucus and cells from the canal of the cervix. Then he or she will record the location of the abnormal areas. If a biopsy is done during the procedure, a medicine will usually be given to numb the area (local anesthetic). You may feel mild pain or cramping while the biopsy is done. After the procedure, tissue samples collected during the biopsy will be sent to a lab  for analysis. AFTER THE PROCEDURE  You will be given instructions on when to follow up with your health care provider for your test results. It is important to keep your appointment. Document Released: 01/14/2003 Document Revised: 06/26/2013 Document Reviewed: 05/23/2013 [facility] Patient Information 2014 [place], [place].

## 2014-04-15 NOTE — Progress Notes (Signed)
   Patient presented to the office today for colposcopic evaluation. She had been seen as a new patient in the practice last month. Patient in 2014 had been diagnosed with breast cancer as follows:  status post right breast biopsy 01/08/2014 for a clinical T1b N0, stage IA invasive ductal carcinoma, grade 2, estrogen and progesterone receptor positive, with an MIB-1 of 15% and no HER-2 amplification  (1) s/p Right lumpectomy 01/27/2014 for a pT1c NX, stage I invasive lobular carcinoma, HER-2 again negative, margins close but negative.  the consensus at the multidisciplinary conference 02/12/2014 was that radiation was optional; the patient opted against radiation and was started on anastrozole to start April 2015   For GYN exam she was noted to have a depigmented area throughout the external genitalia perineum and was asked return to the office for colposcopic evaluation. Patient had mentioned over 30 years ago she had cervical severe dysplasia and had a cervical conization and subsequent Pap smears have been normal. We did do a Pap smear last month which was normal.  Please see previous office visit for detail picture of findings. She underwent a detailed colposcopic evaluation external genitalia perineum and perirectal region as well as the vagina fornix and cervix. The only lesions were noted with a decreased pigmentation and an annular fashion around the external genitalia perineum. One percent lidocaine was infiltrated at the area to the left medial thigh near the perineum with 1% lidocaine. A keypunch biopsy was obtained and submitted for histological evaluation. The ora serrata area that was noted was at the area of the fourchette once again 1% lidocaine was infiltrated at the base and a keypunch biopsy was obtained and also submitted for histological evaluation.  Assessment/plan: #1 rule out VAIN I anterior the fourchette #2 rule out lichen sclerosis of the vaginal. Silver nitrate and Monsel  solution was used for hemostasis will wait for the results and manage accordingly.

## 2014-04-16 ENCOUNTER — Encounter: Payer: Self-pay | Admitting: Gynecology

## 2014-04-16 ENCOUNTER — Other Ambulatory Visit: Payer: Self-pay | Admitting: Gynecology

## 2014-04-16 MED ORDER — CLOBETASOL PROPIONATE 0.05 % EX OINT: TOPICAL_OINTMENT | CUTANEOUS | Status: DC

## 2014-05-12 ENCOUNTER — Encounter (INDEPENDENT_AMBULATORY_CARE_PROVIDER_SITE_OTHER): Payer: Medicare Other | Admitting: Ophthalmology

## 2014-05-12 DIAGNOSIS — H43819 Vitreous degeneration, unspecified eye: Secondary | ICD-10-CM

## 2014-05-12 DIAGNOSIS — I10 Essential (primary) hypertension: Secondary | ICD-10-CM

## 2014-05-12 DIAGNOSIS — H3530 Unspecified macular degeneration: Secondary | ICD-10-CM

## 2014-05-12 DIAGNOSIS — H35039 Hypertensive retinopathy, unspecified eye: Secondary | ICD-10-CM

## 2014-05-14 ENCOUNTER — Ambulatory Visit: Payer: Medicare Other | Admitting: Oncology

## 2014-05-14 ENCOUNTER — Encounter: Payer: Self-pay | Admitting: *Deleted

## 2014-05-14 ENCOUNTER — Other Ambulatory Visit: Payer: Medicare Other

## 2014-05-14 ENCOUNTER — Telehealth: Payer: Self-pay | Admitting: *Deleted

## 2014-05-14 NOTE — [facility] Oncology Navigator Note (Signed)
I returned patient's call to review her scheduled appointments.  Patient reports that she is doing well with not complaints or concerns.  She denied any questions at this time.  I encouraged her to call me for any needs.

## 2014-05-14 NOTE — Telephone Encounter (Signed)
Pt called stating that her husband has an appt on 8/5 that she has to take him too and needs to reschedule her appt.  Confirmed 07/02/14 appt w/ pt.

## 2014-06-03 ENCOUNTER — Encounter: Payer: Self-pay | Admitting: Gynecology

## 2014-06-03 ENCOUNTER — Ambulatory Visit (INDEPENDENT_AMBULATORY_CARE_PROVIDER_SITE_OTHER): Payer: Medicare Other | Admitting: Gynecology

## 2014-06-03 VITALS — BP 130/82

## 2014-06-03 DIAGNOSIS — L900 Lichen sclerosus et atrophicus: Secondary | ICD-10-CM

## 2014-06-03 DIAGNOSIS — L940 Localized scleroderma [morphea]: Secondary | ICD-10-CM

## 2014-06-03 DIAGNOSIS — I83893 Varicose veins of bilateral lower extremities with other complications: Secondary | ICD-10-CM

## 2014-06-03 MED ORDER — CLOBETASOL PROPIONATE 0.05 % EX OINT: TOPICAL_OINTMENT | CUTANEOUS | Status: DC

## 2014-06-03 NOTE — Progress Notes (Signed)
   Patient presented to the office today for 6 weeks followup since she was started on clobetasol 0.05% as a result of vulvar biopsy done which had demonstrated the following:  Diagnosis 1. Vulva, biopsy, fourchette - LICHEN SCLEROSIS ET ATROPHICUS. 2. Anus, biopsy, buttock, left medial - LICHEN SCLEROSUS ET ATROPHICUS.  She's doing well with no complaints from her genital area but she is complaining of cramping and achiness of her legs the results of her varicose veins.  Exam today: External genitalia lichen sclerosus completely healed. Lower extremity superficial varicosity noted on both medial thighs and lower extremities. Negative Homans sign. Both legs were equal in size. Popliteal, medial malleolus, dorsalis pedis pulses were equal.  Assessment/plan: Patient responded well to clobetasol topical steroid application for the past 6 weeks. She will continue to apply it 3 times a week for one more month and then twice a week thereafter. She will be referred to the vascular clinic her symptomatic lower extremities varicosities.

## 2014-06-03 NOTE — Patient Instructions (Signed)
Varicose Veins Varicose veins are veins that have become enlarged and twisted. CAUSES This condition is the result of valves in the veins not working properly. Valves in the veins help return blood from the leg to the heart. If these valves are damaged, blood flows backwards and backs up into the veins in the leg near the skin. This causes the veins to become larger. People who are on their feet a lot, who are pregnant, or who are overweight are more likely to develop varicose veins. SYMPTOMS   Bulging, twisted-appearing, bluish veins, most commonly found on the legs.  Leg pain or a feeling of heaviness. These symptoms may be worse at the end of the day.  Leg swelling.  Skin color changes. DIAGNOSIS  Varicose veins can usually be diagnosed with an exam of your legs by your caregiver. He or she may recommend an ultrasound of your leg veins. TREATMENT  Most varicose veins can be treated at home.However, other treatments are available for people who have persistent symptoms or who want to treat the cosmetic appearance of the varicose veins. These include:  Laser treatment of very small varicose veins.  Medicine that is shot (injected) into the vein. This medicine hardens the walls of the vein and closes off the vein. This treatment is called sclerotherapy. Afterwards, you may need to wear clothing or bandages that apply pressure.  Surgery. HOME CARE INSTRUCTIONS   Do not stand or sit in one position for long periods of time. Do not sit with your legs crossed. Rest with your legs raised during the day.  Wear elastic stockings or support hose. Do not wear other tight, encircling garments around the legs, pelvis, or waist.  Walk as much as possible to increase blood flow.  Raise the foot of your bed at night with 2-inch blocks.  If you get a cut in the skin over the vein and the vein bleeds, lie down with your leg raised and press on it with a clean cloth until the bleeding stops. Then  place a bandage (dressing) on the cut. See your caregiver if it continues to bleed or needs stitches. SEEK MEDICAL CARE IF:   The skin around your ankle starts to break down.  You have pain, redness, tenderness, or hard swelling developing in your leg over a vein.  You are uncomfortable due to leg pain. Document Released: 08/03/2005 Document Revised: 01/16/2012 Document Reviewed: 12/20/2010 [facility] Patient Information 2015 [place], [place]. This information is not intended to replace advice given to you by your health care provider. Make sure you discuss any questions you have with your health care provider.

## 2014-06-11 ENCOUNTER — Other Ambulatory Visit: Payer: Medicare Other

## 2014-06-11 ENCOUNTER — Other Ambulatory Visit: Payer: Self-pay | Admitting: Dermatology

## 2014-06-11 ENCOUNTER — Ambulatory Visit: Payer: Medicare Other | Admitting: Oncology

## 2014-07-02 ENCOUNTER — Ambulatory Visit (HOSPITAL_BASED_OUTPATIENT_CLINIC_OR_DEPARTMENT_OTHER): Payer: Medicare Other | Admitting: Oncology

## 2014-07-02 ENCOUNTER — Other Ambulatory Visit (HOSPITAL_BASED_OUTPATIENT_CLINIC_OR_DEPARTMENT_OTHER): Payer: Medicare Other

## 2014-07-02 VITALS — BP 126/62 | HR 63 | Temp 98.6°F | Resp 18 | Ht 64.5 in | Wt 201.1 lb

## 2014-07-02 DIAGNOSIS — C50419 Malignant neoplasm of upper-outer quadrant of unspecified female breast: Secondary | ICD-10-CM

## 2014-07-02 DIAGNOSIS — R61 Generalized hyperhidrosis: Secondary | ICD-10-CM

## 2014-07-02 DIAGNOSIS — F3289 Other specified depressive episodes: Secondary | ICD-10-CM

## 2014-07-02 DIAGNOSIS — F329 Major depressive disorder, single episode, unspecified: Secondary | ICD-10-CM

## 2014-07-02 DIAGNOSIS — C50919 Malignant neoplasm of unspecified site of unspecified female breast: Secondary | ICD-10-CM

## 2014-07-02 DIAGNOSIS — C50411 Malignant neoplasm of upper-outer quadrant of right female breast: Secondary | ICD-10-CM

## 2014-07-02 DIAGNOSIS — Z170 Estrogen receptor positive status [ER+]: Secondary | ICD-10-CM

## 2014-07-02 LAB — COMPREHENSIVE METABOLIC PANEL (CC13)
ALK PHOS: 71 U/L (ref 40–150)
ALT: 78 U/L — ABNORMAL HIGH (ref 0–55)
AST: 58 U/L — AB (ref 5–34)
Albumin: 3.6 g/dL (ref 3.5–5.0)
Anion Gap: 6 mEq/L (ref 3–11)
BUN: 10.9 mg/dL (ref 7.0–26.0)
CALCIUM: 9.5 mg/dL (ref 8.4–10.4)
CHLORIDE: 109 meq/L (ref 98–109)
CO2: 29 mEq/L (ref 22–29)
CREATININE: 0.7 mg/dL (ref 0.6–1.1)
Glucose: 120 mg/dl (ref 70–140)
Potassium: 4.3 mEq/L (ref 3.5–5.1)
Sodium: 143 mEq/L (ref 136–145)
Total Bilirubin: 0.42 mg/dL (ref 0.20–1.20)
Total Protein: 6.6 g/dL (ref 6.4–8.3)

## 2014-07-02 LAB — CBC WITH DIFFERENTIAL/PLATELET
BASO%: 0.8 % (ref 0.0–2.0)
BASOS ABS: 0.1 10*3/uL (ref 0.0–0.1)
EOS%: 6.1 % (ref 0.0–7.0)
Eosinophils Absolute: 0.4 10*3/uL (ref 0.0–0.5)
HCT: 38.8 % (ref 34.8–46.6)
HGB: 12.2 g/dL (ref 11.6–15.9)
LYMPH%: 27.4 % (ref 14.0–49.7)
MCH: 31.6 pg (ref 25.1–34.0)
MCHC: 31.4 g/dL — ABNORMAL LOW (ref 31.5–36.0)
MCV: 100.5 fL (ref 79.5–101.0)
MONO#: 0.5 10*3/uL (ref 0.1–0.9)
MONO%: 7.9 % (ref 0.0–14.0)
NEUT%: 57.8 % (ref 38.4–76.8)
NEUTROS ABS: 3.4 10*3/uL (ref 1.5–6.5)
Platelets: 222 10*3/uL (ref 145–400)
RBC: 3.86 10*6/uL (ref 3.70–5.45)
RDW: 13 % (ref 11.2–14.5)
WBC: 5.9 10*3/uL (ref 3.9–10.3)
lymph#: 1.6 10*3/uL (ref 0.9–3.3)

## 2014-07-02 NOTE — Progress Notes (Signed)
[place]  Telephone:(336) [phone] Fax:(336) [phone]     ID: Jessica Bautista OB: January 24, 1941  MR#: [id number]  GYB#:[id number]  PCP: Jessica Pac, MD GYN:   SU: Excell Seltzer OTHER MD: Jessica Bautista  CHIEF COMPLAINT: Early stage estrogen receptor positive breast cancer CURRENT TREATMENT: Anastrozole   BREAST CANCER HISTORY: From the original intake note:  Jessica Bautista had routine bilateral screening mammography at the breast Center to 01/24/2014. A possible mass in the right breast was noted, and she was recalled for a right diagnostic mammogram and ultrasound 01-2014. This showed an irregular 8 mm mass in the lateral right breast, with a second more circumscribed 7 mm mass inferiorly, which was stable. There was no palpable mass by exam. Ultrasound showed a 7 mm irregular hypoechoic mass in the right breast upper outer quadrant. The second area noted on mammography was a simple cyst. There was no right axillary adenopathy.  Biopsy of the questionable mass 01/08/2014 showed ([id number]) an invasive ductal carcinoma with papillary features, grade 2estrogen and progesterone receptor positive, with an MIB-1 of 15 and no HER-2 amplification, the signals ratio being 1.19 in the number per cell 1.55.   The patient's subsequent history is as detailed below  INTERVAL HISTORY: Jessica Bautista returns today for followup of her breast cancer accompanied by her husband Jessica Bautista. Since her last visit here she started anastrozole, which she was able to obtain adequate) a little over $40 for 3 months). The only side effect she notices is hot flashes. These are something she "went through" in the past, but they are making her day uncomfortable and waking her up at night.  REVIEW OF SYSTEMS: Jessica Bautista has a history of low back pain. It is a little bit more pronounced. She also is having upper spinal or shoulder pain, but when she went and had some yoga things improved. She describes herself as a little  depressed. She did visit her children in [place] and that helped. But all her family except her husband lives in [place] or [place]. She doesn't have any prospects regarding moving, which would be "too difficult.". Aside from yoga and IgA she exercises but doing yard work. She occasionally takes walks. When she gets a little irritable or depressed she goes to bed. Unfortunately she doesn't wake up refreshed were feeling better. A detailed review of systems today was otherwise stable  PAST MEDICAL HISTORY: Past Medical History  Diagnosis Date  . Hypertension   . Thyroid disease   . Hyperlipidemia   . Psoriasis   . Pulmonary embolus     post op tummy tuc  . Wears glasses   . Skin cancer   . Breast cancer metastasized to pelvis   . Breast cancer   . Lichen sclerosus et atrophicus of the vulva     PAST SURGICAL HISTORY: Past Surgical History  Procedure Laterality Date  . Ankle fracture surgery  1998    right  . Wrist fracture surgery  1975    right  . Abdominoplasty  1998  . Tubal ligation    . Colonoscopy    . Breast lumpectomy with needle localization Right 01/27/2014    Procedure: BREAST LUMPECTOMY WITH NEEDLE LOCALIZATION;  Surgeon: Edward Jolly, MD;  Location: [facility];  Service: General;  Laterality: Right;  . Breast surgery      FAMILY HISTORY Family History  Problem Relation Age of Onset  . Cancer Brother     LUNG- SMOKER    The patient's father  died at the age of 62 with multiple comorbidities including diabetes heart disease and COPD. The patient's mother died at the age of 35. The patient had 3 brothers, one of whom died from lung cancer at the age of 65. He was a smoker. She had 2 sisters. There is no history of breast or ovarian cancer in the family.  GYNECOLOGIC HISTORY:  Menarche age 39, first live birth age 73, the patient is [place] P5. She stopped having periods "about 20 years ago". She did not take hormone replacement. 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 Her husband Jessica Bautista, is currently 42 years old. "He mostly stays at home and watches sports". Son been lives in [place] and is in pacemaker sales. Daughter [name] lives in [place] where she works for a Herbalist areas. Daughter [name] heritage lives in [place] and is an Glass blower/designer. Son [name] lives in [place] and is a Probation officer. Son [name] also lives in [place], and works in plumbing. The patient has multiple grandchildren. She attends [employer]    ADVANCED DIRECTIVES: In place   HEALTH MAINTENANCE: History  Substance Use Topics  . Smoking status: Former Smoker -- 1.00 packs/day    Types: Cigarettes    Quit date: 01/23/1983  . Smokeless tobacco: Not on file  . Alcohol Use: 1.8 oz/week    3 Glasses of wine per week     Colonoscopy:  PAP:  Bone density: 11/18/2013 at [facility], lowest T score of -0.7 (normal) at the right femoral neck  Lipid panel:  Allergies  Allergen Reactions  . Tape Itching and Rash    Adhesive Tape    Current Outpatient Prescriptions  Medication Sig Dispense Refill  . anastrozole (ARIMIDEX) 1 MG tablet Take 1 tablet (1 mg total) by mouth daily.  90 tablet  3  . clobetasol ointment (TEMOVATE) 0.05 % Applied twice a week  30 g  5  . lisinopril (PRINIVIL,ZESTRIL) 10 MG tablet Take 10 mg by mouth daily.      . pravastatin (PRAVACHOL) 40 MG tablet Take 20 mg by mouth daily.       Marland Kitchen SYNTHROID 112 MCG tablet Take 112 mcg by mouth daily before breakfast.        No current facility-administered medications for this visit.    OBJECTIVE: Middle-aged white woman in no acute distress Filed Vitals:   07/02/14 1206  BP: 126/62  Pulse: 63  Temp: 98.6 F (37 C)  Resp: 18     Body mass index is 34 kg/(m^2).    ECOG FS:1 - Symptomatic but completely ambulatory  Ocular: Sclerae  unicteric, pupils round and equal Ear-nose-throat: Oropharynx clear, dentition in fair repair Lymphatic: No cervical or supraclavicular adenopathy Lungs no rales or rhonchi Heart regular rate and rhythm Abd soft, nontender, positive bowel sounds MSK no focal spinal tenderness, no upper extremity lymphedema Neuro: non-focal, well-oriented, appropriate affect Breasts: The right breast is status post lumpectomy. There is no evidence of local recurrence. The right axilla is benign. The left breast is unremarkable   LAB RESULTS:  CMP     Component Value Date/Time   NA 143 07/02/2014 1130   NA 141 01/27/2011 1923   K 4.3 07/02/2014 1130   K 4.4 01/27/2011 1923   CL 108 01/27/2011 1923   CO2 29 07/02/2014 1130   CO2 26  01/27/2011 1923   GLUCOSE 120 07/02/2014 1130   GLUCOSE 108* 01/27/2011 1923   BUN 10.9 07/02/2014 1130   BUN 17 01/27/2011 1923   CREATININE 0.7 07/02/2014 1130   CREATININE 0.79 01/27/2011 1923   CALCIUM 9.5 07/02/2014 1130   CALCIUM 9.2 01/27/2011 1923   PROT 6.6 07/02/2014 1130   PROT 7.3 01/27/2011 1923   ALBUMIN 3.6 07/02/2014 1130   ALBUMIN 4.3 01/27/2011 1923   AST 58* 07/02/2014 1130   AST 64* 01/27/2011 1923   ALT 78* 07/02/2014 1130   ALT 89* 01/27/2011 1923   ALKPHOS 71 07/02/2014 1130   ALKPHOS 68 01/27/2011 1923   BILITOT 0.42 07/02/2014 1130   BILITOT 0.2* 01/27/2011 1923   GFRNONAA >60 01/27/2011 1923   GFRAA  Value: >60        The eGFR has been calculated using the MDRD equation. This calculation has not been validated in all clinical situations. eGFR's persistently <60 mL/min signify possible Chronic Kidney Disease. 01/27/2011 1923    I No results found for this basename: SPEP,  UPEP,   kappa and lambda light chains    Lab Results  Component Value Date   WBC 5.9 07/02/2014   NEUTROABS 3.4 07/02/2014   HGB 12.2 07/02/2014   HCT 38.8 07/02/2014   MCV 100.5 07/02/2014   PLT 222 07/02/2014      Chemistry      Component Value Date/Time   NA 143 07/02/2014 1130   NA  141 01/27/2011 1923   K 4.3 07/02/2014 1130   K 4.4 01/27/2011 1923   CL 108 01/27/2011 1923   CO2 29 07/02/2014 1130   CO2 26 01/27/2011 1923   BUN 10.9 07/02/2014 1130   BUN 17 01/27/2011 1923   CREATININE 0.7 07/02/2014 1130   CREATININE 0.79 01/27/2011 1923      Component Value Date/Time   CALCIUM 9.5 07/02/2014 1130   CALCIUM 9.2 01/27/2011 1923   ALKPHOS 71 07/02/2014 1130   ALKPHOS 68 01/27/2011 1923   AST 58* 07/02/2014 1130   AST 64* 01/27/2011 1923   ALT 78* 07/02/2014 1130   ALT 89* 01/27/2011 1923   BILITOT 0.42 07/02/2014 1130   BILITOT 0.2* 01/27/2011 1923       No results found for this basename: LABCA2    No components found with this basename: LABCA125    No results found for this basename: INR,  in the last 168 hours  Urinalysis No results found for this basename: colorurine,  appearanceur,  labspec,  phurine,  glucoseu,  hgbur,  bilirubinur,  ketonesur,  proteinur,  urobilinogen,  nitrite,  leukocytesur    STUDIES: No results found.  ASSESSMENT: 73 y.o. [place] woman status post right breast biopsy 01/08/2014 for a clinical T1b N0, stage IA invasive ductal carcinoma, grade 2, estrogen and progesterone receptor positive, with an MIB-1 of 15% and no HER-2 amplification  (1) s/p Right lumpectomy 01/27/2014 for a pT1c NX, stage I invasive lobular carcinoma, HER-2 again negative, margins close but negative  (2) the consensus at the multidisciplinary conference 02/12/2014 was that radiation was optional; the patient discussed this with her radiation oncologist and opted against radiation  (3) anastrozole to start April 2015; normal bone density 11/18/2013  PLAN: Jessica Bautista is doing well with the anastrozole except for worsening hot flashes. We talked about increasing the Lexapro, since she is also a little bit of a depressed side, and she is going to give that a try. She will be on 20 mg instead  of 10 and let us know after 2 weeks whether she would like a new prescription for  the higher dose or simply would prefer to go back to be earlier dose.  For the nighttime hot flashes we are going to try gabapentin 300 mg at bedtime. We discussed the possible toxicities, side effects and complications of this agent, but it is generally very well tolerated Jessica Bautista essentially will be taking one fourth of the usual dose. She understands this should not have an effect on her daytime hot flashes, but it may help her sleep a little bit better.  I think part of her depression is isolation, being separated from her children in [place] and [place], and then the usual aches and pains of age. Her symptoms improved when she does yoga or tai chi and I again encouraged her to continue and intensify her participation in those programs.  She has a good understanding of your overall plan, and agrees with it. She knows the goal of treatment in her case is cure. She will call with any problems that may develop before next visit, which will be in 3 months. If she is tolerating the anastrozole well at that time we will start broadening the followup interval    Chauncey Cruel, MD   07/02/2014 12:29 PM

## 2014-07-03 ENCOUNTER — Telehealth: Payer: Self-pay | Admitting: Oncology

## 2014-07-03 NOTE — Telephone Encounter (Signed)
Cld & spoke to [place] (spouse) in re to appt time & date-stated will give pt info

## 2014-08-19 ENCOUNTER — Other Ambulatory Visit: Payer: Self-pay | Admitting: Dermatology

## 2014-08-20 ENCOUNTER — Encounter: Payer: Self-pay | Admitting: *Deleted

## 2014-08-20 ENCOUNTER — Other Ambulatory Visit: Payer: Self-pay | Admitting: Emergency Medicine

## 2014-08-20 MED ORDER — ESCITALOPRAM OXALATE 20 MG PO TABS: 20.0000 mg | ORAL_TABLET | Freq: Every day | ORAL | Status: DC

## 2014-08-20 NOTE — [facility] Oncology Navigator Note (Signed)
Patient called and reported that at her last visit with Dr. Jana Hakim, he advised her to increase her dose of Lexapro from 10 mg to 20 mg daily and to call back if it is effective and he will write a prescription.  She has found the increased dose to be effective and needs the prescription.  Dr. Virgie Dad nurse informed who will call in the prescription.  Patient reports that she is doing very well.  Her only complaint is that she continues to have hot flashes but says that they are tolerable.  She denies any other questions or concerns at this time.  I encouraged her to call me for any needs.

## 2014-09-08 ENCOUNTER — Encounter: Payer: Self-pay | Admitting: Gynecology

## 2014-09-09 ENCOUNTER — Telehealth: Payer: Self-pay | Admitting: Oncology

## 2014-09-09 ENCOUNTER — Ambulatory Visit (HOSPITAL_BASED_OUTPATIENT_CLINIC_OR_DEPARTMENT_OTHER): Payer: Medicare Other | Admitting: Oncology

## 2014-09-09 VITALS — BP 129/56 | HR 68 | Temp 97.7°F | Resp 18 | Ht 64.5 in | Wt 199.3 lb

## 2014-09-09 DIAGNOSIS — C50411 Malignant neoplasm of upper-outer quadrant of right female breast: Secondary | ICD-10-CM

## 2014-09-09 DIAGNOSIS — Z170 Estrogen receptor positive status [ER+]: Secondary | ICD-10-CM

## 2014-09-09 MED ORDER — ANASTROZOLE 1 MG PO TABS: 1.0000 mg | ORAL_TABLET | Freq: Every day | ORAL | Status: DC

## 2014-09-09 MED ORDER — GABAPENTIN 300 MG PO CAPS: 300.0000 mg | ORAL_CAPSULE | Freq: Every day | ORAL | Status: DC

## 2014-09-09 NOTE — Progress Notes (Signed)
[facility]  Telephone:(336) [phone] Fax:(336) [phone]     ID: Jessica Bautista OB: 08-05-1941  MR#: [id number]  QRF#:[id number]  PCP: Jessica Pac, MD GYN:   SU: Jessica Bautista OTHER MD: Jessica Bautista  CHIEF COMPLAINT: Early stage estrogen receptor positive breast cancer CURRENT TREATMENT: Anastrozole   BREAST CANCER HISTORY: From the original intake note:  Jessica Bautista had routine bilateral screening mammography at the breast Center to 01/24/2014. A possible mass in the right breast was noted, and she was recalled for a right diagnostic mammogram and ultrasound 01-2014. This showed an irregular 8 mm mass in the lateral right breast, with a second more circumscribed 7 mm mass inferiorly, which was stable. There was no palpable mass by exam. Ultrasound showed a 7 mm irregular hypoechoic mass in the right breast upper outer quadrant. The second area noted on mammography was a simple cyst. There was no right axillary adenopathy.  Biopsy of the questionable mass 01/08/2014 showed ([id number] [id number]) an invasive ductal carcinoma with papillary features, grade 2estrogen and progesterone receptor positive, with an MIB-1 of 15 and no HER-2 amplification, the signals ratio being 1.19 in the number per cell 1.55.   The patient's subsequent history is as detailed below  INTERVAL HISTORY: Jessica Bautista returns today for followup of her breast cancer. She continues on anastrozole, which she is tolerating well. The hot flashes have not completely resolved but nearly so. Vaginal dryness is not a concern. She is doing more traveling, to [place] and [place]. She finds that the higher dose of Lexapro has pretty much taking care of the depressive symptoms.  REVIEW OF SYSTEMS: Jessica Bautista continues to have problems with insomnia. She usually gets to bed around 2 falls asleepyour 3 hours later, and gets up around 9. She make sure to have the room [place]. She has had some basal cells removed recently and she is  concerned that they keep "topping up". Her psoriasis well-controlled. When she walks her dog she's fine but when she would took longer walks during her trips to [facility] for example of her hips hurt. They don't hurt now. A detailed review of systems today was otherwise noncontributory  PAST MEDICAL HISTORY: Past Medical History  Diagnosis Date  . Hypertension   . Thyroid disease   . Hyperlipidemia   . Psoriasis   . Pulmonary embolus     post op tummy tuc  . Wears glasses   . Skin cancer   . Breast cancer metastasized to pelvis   . Breast cancer   . Lichen sclerosus et atrophicus of the vulva     PAST SURGICAL HISTORY: Past Surgical History  Procedure Laterality Date  . Ankle fracture surgery  1998    right  . Wrist fracture surgery  1975    right  . Abdominoplasty  1998  . Tubal ligation    . Colonoscopy    . Breast lumpectomy with needle localization Right 01/27/2014    Procedure: BREAST LUMPECTOMY WITH NEEDLE LOCALIZATION;  Surgeon: Edward Jolly, MD;  Location: [facility];  Service: General;  Laterality: Right;  . Breast surgery      FAMILY HISTORY Family History  Problem Relation Age of Onset  . Cancer Brother     LUNG- 14    The patient's father died at the age of 20 with multiple comorbidities including diabetes heart disease and COPD. The patient's mother died at the age of 60. The patient had 3 brothers, one of whom died from lung  cancer at the age of 48. He was a smoker. She had 2 sisters. There is no history of breast or ovarian cancer in the family.  GYNECOLOGIC HISTORY:  Menarche age 63, first live birth age 42, the patient is [place] P5. She stopped having periods "about 20 years ago". She did not take hormone replacement. 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 Her husband Jessica Bautista, is currently 51 years old. "He mostly stays at home and watches  sports". Son been lives in [place] and is in pacemaker sales. Daughter Jessica Bautista lives in [place] where she works for a Herbalist areas. Daughter Jessica Bautista lives in [place] and is an Glass blower/designer. Son Jessica Bautista lives in [place] and is a Probation officer. Son Jessica Bautista also lives in [place], and works in plumbing. The patient has multiple grandchildren. She attends [employer]    ADVANCED DIRECTIVES: In place   HEALTH MAINTENANCE: History  Substance Use Topics  . Smoking status: Former Smoker -- 1.00 packs/day    Types: Cigarettes    Quit date: 01/23/1983  . Smokeless tobacco: Not on file  . Alcohol Use: 1.8 oz/week    3 Glasses of wine per week     Colonoscopy:  PAP:  Bone density: 11/18/2013 at [facility], lowest T score of -0.7 (normal) at the right femoral neck  Lipid panel:  Allergies  Allergen Reactions  . Tape Itching and Rash    Adhesive Tape    Current Outpatient Prescriptions  Medication Sig Dispense Refill  . anastrozole (ARIMIDEX) 1 MG tablet Take 1 tablet (1 mg total) by mouth daily. 90 tablet 3  . clobetasol ointment (TEMOVATE) 0.05 % Applied twice a week 30 g 5  . escitalopram (LEXAPRO) 20 MG tablet Take 1 tablet (20 mg total) by mouth daily. 90 tablet 3  . lisinopril (PRINIVIL,ZESTRIL) 10 MG tablet Take 10 mg by mouth daily.    . pravastatin (PRAVACHOL) 40 MG tablet Take 20 mg by mouth daily.     Marland Kitchen SYNTHROID 112 MCG tablet Take 112 mcg by mouth daily before breakfast.      No current facility-administered medications for this visit.    OBJECTIVE: Middle-aged white woman Who appears stated age 73 Vitals:   09/09/14 1537  BP: 129/56  Pulse: 68  Temp: 97.7 F (36.5 C)  Resp: 18     Body mass index is 33.69 kg/(m^2).    ECOG FS:0 - Asymptomatic  Ocular: Sclerae unicteric, EOMs intact Ear-nose-throat: Oropharynx clearand moist Lymphatic: No cervical or supraclavicular adenopathy Lungs no rales or  rhonchi Heart regular rate and rhythm Abd soft, nontender, positive bowel sounds MSK no focal spinal tenderness, no upper extremity lymphedema Neuro: non-focal, well-oriented, appropriate affect Breasts: The right breast is status post lumpectomy. There is no evidence of local recurrence. The right axilla is benign. The left breast is unremarkable   LAB RESULTS:  CMP     Component Value Date/Time   NA 143 07/02/2014 1130   NA 141 01/27/2011 1923   K 4.3 07/02/2014 1130   K 4.4 01/27/2011 1923   CL 108 01/27/2011 1923   CO2 29 07/02/2014 1130   CO2 26 01/27/2011 1923   GLUCOSE 120 07/02/2014 1130   GLUCOSE 108* 01/27/2011 1923   BUN 10.9 07/02/2014 1130   BUN 17 01/27/2011 1923   CREATININE 0.7 07/02/2014 1130  CREATININE 0.79 01/27/2011 1923   CALCIUM 9.5 07/02/2014 1130   CALCIUM 9.2 01/27/2011 1923   PROT 6.6 07/02/2014 1130   PROT 7.3 01/27/2011 1923   ALBUMIN 3.6 07/02/2014 1130   ALBUMIN 4.3 01/27/2011 1923   AST 58* 07/02/2014 1130   AST 64* 01/27/2011 1923   ALT 78* 07/02/2014 1130   ALT 89* 01/27/2011 1923   ALKPHOS 71 07/02/2014 1130   ALKPHOS 68 01/27/2011 1923   BILITOT 0.42 07/02/2014 1130   BILITOT 0.2* 01/27/2011 1923   GFRNONAA >60 01/27/2011 1923   GFRAA  01/27/2011 1923    >60        The eGFR has been calculated using the MDRD equation. This calculation has not been validated in all clinical situations. eGFR's persistently <60 mL/min signify possible Chronic Kidney Disease.    I No results found for: SPEP  Lab Results  Component Value Date   WBC 5.9 07/02/2014   NEUTROABS 3.4 07/02/2014   HGB 12.2 07/02/2014   HCT 38.8 07/02/2014   MCV 100.5 07/02/2014   PLT 222 07/02/2014      Chemistry      Component Value Date/Time   NA 143 07/02/2014 1130   NA 141 01/27/2011 1923   K 4.3 07/02/2014 1130   K 4.4 01/27/2011 1923   CL 108 01/27/2011 1923   CO2 29 07/02/2014 1130   CO2 26 01/27/2011 1923   BUN 10.9 07/02/2014 1130   BUN 17  01/27/2011 1923   CREATININE 0.7 07/02/2014 1130   CREATININE 0.79 01/27/2011 1923      Component Value Date/Time   CALCIUM 9.5 07/02/2014 1130   CALCIUM 9.2 01/27/2011 1923   ALKPHOS 71 07/02/2014 1130   ALKPHOS 68 01/27/2011 1923   AST 58* 07/02/2014 1130   AST 64* 01/27/2011 1923   ALT 78* 07/02/2014 1130   ALT 89* 01/27/2011 1923   BILITOT 0.42 07/02/2014 1130   BILITOT 0.2* 01/27/2011 1923       No results found for: LABCA2  No components found for: LABCA125  No results for input(s): INR in the last 168 hours.  Urinalysis No results found for: COLORURINE  STUDIES: No results found.  ASSESSMENT: 73 y.o. [place] woman status post right breast biopsy 01/08/2014 for a clinical T1b N0, stage IA invasive ductal carcinoma, grade 2, estrogen and progesterone receptor positive, with an MIB-1 of 15% and no HER-2 amplification  (1) s/p Right lumpectomy 01/27/2014 for a pT1c NX, stage I invasive lobular carcinoma, HER-2 again negative, margins close but negative  (2) the consensus at the multidisciplinary conference 02/12/2014 was that radiation was optional; the patient discussed this with her radiation oncologist and opted against radiation  (3) anastrozole started April 2015; normal bone density 11/18/2013  PLAN: Shondell is Tolerating the anastrozole much better. The plan is going to be to continue this drug for 5 years. She will need a repeat bone density January 2017.  Her depression is much better on the current dose of Lexapro so we are continuing that.  She continues to have significant insomnia problems. I think she would benefit from gabapentin and we again discussed that at length today. She will be uncertain to milligrams at bedtime. She will let me know if she has any side effects and also if this dose does not work.  If she sees Dr. Rex Kras in January, she will see me again in April and then she can see Dr. Excell Bautista in July. The idea is that she be seen by  a  physician every 3 months for the next year or year and a half.  Arnita has a good understanding of the overall plan. She agrees with it. She knows the goal of treatment in her case is cure. She will call with any problems that may develop before her next visit here.      Chauncey Cruel, MD   09/09/2014 3:41 PM

## 2014-09-09 NOTE — Telephone Encounter (Signed)
, °

## 2014-12-17 ENCOUNTER — Encounter: Payer: Self-pay | Admitting: *Deleted

## 2014-12-17 NOTE — [facility] Oncology Navigator Note (Signed)
I called patient to check in.  Patient reports that she is doing well.  At her last appointment with Dr. Jana Hakim, he prescribed Gabapentin to help with her insomnia.  She reports that it did not help and that she has stopped taking the medication.  Her PCP has prescribed another medication which she says has helped.  I reminded her of her next appointment with Dr. Jana Hakim in April.  She denied any other questions or concerns at this time.  I encouraged her to call me for any needs she may have.

## 2015-02-04 ENCOUNTER — Other Ambulatory Visit: Payer: Self-pay

## 2015-02-04 ENCOUNTER — Other Ambulatory Visit: Payer: Self-pay | Admitting: General Surgery

## 2015-02-04 DIAGNOSIS — Z1231 Encounter for screening mammogram for malignant neoplasm of breast: Secondary | ICD-10-CM

## 2015-02-04 DIAGNOSIS — Z853 Personal history of malignant neoplasm of breast: Secondary | ICD-10-CM

## 2015-02-10 ENCOUNTER — Other Ambulatory Visit (HOSPITAL_BASED_OUTPATIENT_CLINIC_OR_DEPARTMENT_OTHER): Payer: PPO

## 2015-02-10 DIAGNOSIS — C50419 Malignant neoplasm of upper-outer quadrant of unspecified female breast: Secondary | ICD-10-CM

## 2015-02-10 DIAGNOSIS — C50919 Malignant neoplasm of unspecified site of unspecified female breast: Secondary | ICD-10-CM

## 2015-02-10 LAB — CBC WITH DIFFERENTIAL/PLATELET
BASO%: 1.3 % (ref 0.0–2.0)
Basophils Absolute: 0.1 10*3/uL (ref 0.0–0.1)
EOS%: 7.8 % — ABNORMAL HIGH (ref 0.0–7.0)
Eosinophils Absolute: 0.5 10*3/uL (ref 0.0–0.5)
HEMATOCRIT: 40 % (ref 34.8–46.6)
HEMOGLOBIN: 13 g/dL (ref 11.6–15.9)
LYMPH%: 31.6 % (ref 14.0–49.7)
MCH: 32.3 pg (ref 25.1–34.0)
MCHC: 32.5 g/dL (ref 31.5–36.0)
MCV: 99.3 fL (ref 79.5–101.0)
MONO#: 0.6 10*3/uL (ref 0.1–0.9)
MONO%: 9.4 % (ref 0.0–14.0)
NEUT%: 49.9 % (ref 38.4–76.8)
NEUTROS ABS: 3.3 10*3/uL (ref 1.5–6.5)
Platelets: 256 10*3/uL (ref 145–400)
RBC: 4.03 10*6/uL (ref 3.70–5.45)
RDW: 13.1 % (ref 11.2–14.5)
WBC: 6.7 10*3/uL (ref 3.9–10.3)
lymph#: 2.1 10*3/uL (ref 0.9–3.3)

## 2015-02-10 LAB — COMPREHENSIVE METABOLIC PANEL (CC13)
ALT: 68 U/L — ABNORMAL HIGH (ref 0–55)
ANION GAP: 10 meq/L (ref 3–11)
AST: 48 U/L — ABNORMAL HIGH (ref 5–34)
Albumin: 3.9 g/dL (ref 3.5–5.0)
Alkaline Phosphatase: 83 U/L (ref 40–150)
BUN: 13.9 mg/dL (ref 7.0–26.0)
CALCIUM: 8.9 mg/dL (ref 8.4–10.4)
CO2: 26 meq/L (ref 22–29)
CREATININE: 0.8 mg/dL (ref 0.6–1.1)
Chloride: 107 mEq/L (ref 98–109)
EGFR: 77 mL/min/{1.73_m2} — ABNORMAL LOW (ref >90)
Glucose: 120 mg/dl (ref 70–140)
Potassium: 4.8 mEq/L (ref 3.5–5.1)
SODIUM: 143 meq/L (ref 136–145)
Total Bilirubin: 0.39 mg/dL (ref 0.20–1.20)
Total Protein: 6.9 g/dL (ref 6.4–8.3)

## 2015-02-11 ENCOUNTER — Ambulatory Visit: Payer: Medicare Other

## 2015-02-11 ENCOUNTER — Ambulatory Visit
Admission: RE | Admit: 2015-02-11 | Discharge: 2015-02-11 | Disposition: A | Discharge status: Home / Self Care | Payer: PPO | Source: Ambulatory Visit | Attending: General Surgery | Admitting: General Surgery

## 2015-02-11 DIAGNOSIS — Z853 Personal history of malignant neoplasm of breast: Secondary | ICD-10-CM

## 2015-02-17 ENCOUNTER — Telehealth: Payer: Self-pay | Admitting: Oncology

## 2015-02-17 ENCOUNTER — Ambulatory Visit (HOSPITAL_BASED_OUTPATIENT_CLINIC_OR_DEPARTMENT_OTHER): Payer: PPO | Admitting: Oncology

## 2015-02-17 VITALS — BP 123/56 | HR 71 | Temp 99.3°F | Resp 18 | Ht 64.5 in | Wt 203.4 lb

## 2015-02-17 DIAGNOSIS — Z853 Personal history of malignant neoplasm of breast: Secondary | ICD-10-CM | POA: Diagnosis not present

## 2015-02-17 DIAGNOSIS — Z79810 Long term (current) use of selective estrogen receptor modulators (SERMs): Secondary | ICD-10-CM

## 2015-02-17 DIAGNOSIS — C50411 Malignant neoplasm of upper-outer quadrant of right female breast: Secondary | ICD-10-CM

## 2015-02-17 MED ORDER — ANASTROZOLE 1 MG PO TABS: 1.0000 mg | ORAL_TABLET | Freq: Every day | ORAL | Status: DC

## 2015-02-17 NOTE — Progress Notes (Signed)
[facility]  [phone] [phone]     ID: Jessica Bautista OB: 12/23/1940  MR#: [id number]  FGH#:[id number]  PCP: Gennette Pac, MD GYN:   SU: Excell Seltzer OTHER MD: Eppie Gibson  CHIEF COMPLAINT: Early stage estrogen receptor positive breast cancer CURRENT TREATMENT: Anastrozole   BREAST CANCER HISTORY: From the original intake note:  Jessica Bautista had routine bilateral screening mammography at the breast Center to 01/24/2014. A possible mass in the right breast was noted, and she was recalled for a right diagnostic mammogram and ultrasound 01-2014. This showed an irregular 8 mm mass in the lateral right breast, with a second more circumscribed 7 mm mass inferiorly, which was stable. There was no palpable mass by exam. Ultrasound showed a 7 mm irregular hypoechoic mass in the right breast upper outer quadrant. The second area noted on mammography was a simple cyst. There was no right axillary adenopathy.  Biopsy of the questionable mass 01/08/2014 showed ([id number]) an invasive ductal carcinoma with papillary features, grade 2estrogen and progesterone receptor positive, with an MIB-1 of 15 and no HER-2 amplification, the signals ratio being 1.19 in the number per cell 1.55.   The patient's subsequent history is as detailed below  INTERVAL HISTORY: Jessica Bautista returns today for followup of her breast cancer. She is tolerating the anastrozole with no side effects that she is aware of. In particular hot flashes are very infrequent and no longer occur at night. She is off the gabapentin. Vaginal dryness and arthralgias/ myalgias are not a concern. She obtains the drug at a good price  REVIEW OF SYSTEMS: Jessica Bautista is a little bit down because he had to put down there dog who may have had for 16 years. Otherwise a detailed review of systems today was significant only for seasonal sinus symptoms.  PAST MEDICAL HISTORY: Past Medical History  Diagnosis Date  .  Hypertension   . Thyroid disease   . Hyperlipidemia   . Psoriasis   . Pulmonary embolus     post op tummy tuc  . Wears glasses   . Skin cancer   . Breast cancer metastasized to pelvis   . Breast cancer   . Lichen sclerosus et atrophicus of the vulva     PAST SURGICAL HISTORY: Past Surgical History  Procedure Laterality Date  . Ankle fracture surgery  1998    right  . Wrist fracture surgery  1975    right  . Abdominoplasty  1998  . Tubal ligation    . Colonoscopy    . Breast lumpectomy with needle localization Right 01/27/2014    Procedure: BREAST LUMPECTOMY WITH NEEDLE LOCALIZATION;  Surgeon: Edward Jolly, MD;  Location: [facility];  Service: General;  Laterality: Right;  . Breast surgery      FAMILY HISTORY Family History  Problem Relation Age of Onset  . Cancer Brother     LUNG- 109    The patient's father died at the age of 53 with multiple comorbidities including diabetes heart disease and COPD. The patient's mother died at the age of 95. The patient had 3 brothers, one of whom died from lung cancer at the age of 67. He was a smoker. She had 2 sisters. There is no history of breast or ovarian cancer in the family.  GYNECOLOGIC HISTORY:  Menarche age 89, first live birth age 89, [name] P5. She stopped having periods "about 20 years ago". She did not take hormone replacement. 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 Her husband Jessica Bautista, is currently 41 years old. "He mostly stays at home and watches sports". Son Jessica Bautista lives in [place] and is in pacemaker sales. Daughter Jessica Bautista lives in [place] where she works for a Herbalist areas. Daughter Jessica Bautista lives in [place] and is an Glass blower/designer. Son Jessica Bautista lives in [place] and is a Probation officer. Son Jessica Bautista also lives in [place], and works in plumbing. The  patient has multiple grandchildren. She attends [employer]    ADVANCED DIRECTIVES: In place   HEALTH MAINTENANCE: History  Substance Use Topics  . Smoking status: Former Smoker -- 1.00 packs/day    Types: Cigarettes    Quit date: 01/23/1983  . Smokeless tobacco: Not on file  . Alcohol Use: 1.8 oz/week    3 Glasses of wine per week     Colonoscopy:  PAP:  Bone density: 11/18/2013 at [facility], lowest T score of -0.7 (normal) at the right femoral neck  Lipid panel:  Allergies  Allergen Reactions  . Tape Itching and Rash    Adhesive Tape    Current Outpatient Prescriptions  Medication Sig Dispense Refill  . anastrozole (ARIMIDEX) 1 MG tablet Take 1 tablet (1 mg total) by mouth daily. 90 tablet 3  . clobetasol ointment (TEMOVATE) 0.05 % Applied twice a week 30 g 5  . escitalopram (LEXAPRO) 20 MG tablet Take 1 tablet (20 mg total) by mouth daily. 90 tablet 3  . lisinopril (PRINIVIL,ZESTRIL) 10 MG tablet Take 10 mg by mouth daily.    . pravastatin (PRAVACHOL) 40 MG tablet Take 20 mg by mouth daily.     Marland Kitchen SYNTHROID 112 MCG tablet Take 112 mcg by mouth daily before breakfast.      No current facility-administered medications for this visit.    OBJECTIVE: Middle-aged white woman in no acute distress Filed Vitals:   02/17/15 1504  BP: 123/56  Pulse: 71  Temp: 99.3 F (37.4 C)  Resp: 18     Body mass index is 34.39 kg/(m^2).    ECOG FS:0 - Asymptomatic  Ocular: Sclerae unicteric, pupils round and equal Ear-nose-throat: Oropharynx , good dentition Lymphatic: No cervical or supraclavicular adenopathy Lungs no rales or rhonchi Heart regular rate and rhythm Abd soft, nontender, positive bowel sounds MSK no focal spinal tenderness, no upper extremity lymphedema Neuro: non-focal, well-oriented, positive affect Breasts: The right breast is status post lumpectomy. There is no evidence of local recurrence. The right axilla is benign. The left breast is  unremarkable   LAB RESULTS:  CMP     Component Value Date/Time   NA 143 02/10/2015 1202   NA 141 01/27/2011 1923   K 4.8 02/10/2015 1202   K 4.4 01/27/2011 1923   CL 108 01/27/2011 1923   CO2 26 02/10/2015 1202   CO2 26 01/27/2011 1923   GLUCOSE 120 02/10/2015 1202   GLUCOSE 108* 01/27/2011 1923   BUN 13.9 02/10/2015 1202   BUN 17 01/27/2011 1923   CREATININE 0.8 02/10/2015 1202   CREATININE 0.79 01/27/2011 1923   CALCIUM 8.9 02/10/2015 1202   CALCIUM 9.2 01/27/2011 1923   PROT 6.9 02/10/2015 1202   PROT 7.3 01/27/2011 1923   ALBUMIN 3.9 02/10/2015 1202   ALBUMIN 4.3 01/27/2011 1923   AST 48* 02/10/2015 1202   AST 64* 01/27/2011 1923   ALT 68*  02/10/2015 1202   ALT 89* 01/27/2011 1923   ALKPHOS 83 02/10/2015 1202   ALKPHOS 68 01/27/2011 1923   BILITOT 0.39 02/10/2015 1202   BILITOT 0.2* 01/27/2011 1923   GFRNONAA >60 01/27/2011 1923   GFRAA  01/27/2011 1923    >60        The eGFR has been calculated using the MDRD equation. This calculation has not been validated in all clinical situations. eGFR's persistently <60 mL/min signify possible Chronic Kidney Disease.    I No results found for: SPEP  Lab Results  Component Value Date   WBC 6.7 02/10/2015   NEUTROABS 3.3 02/10/2015   HGB 13.0 02/10/2015   HCT 40.0 02/10/2015   MCV 99.3 02/10/2015   PLT 256 02/10/2015      Chemistry      Component Value Date/Time   NA 143 02/10/2015 1202   NA 141 01/27/2011 1923   K 4.8 02/10/2015 1202   K 4.4 01/27/2011 1923   CL 108 01/27/2011 1923   CO2 26 02/10/2015 1202   CO2 26 01/27/2011 1923   BUN 13.9 02/10/2015 1202   BUN 17 01/27/2011 1923   CREATININE 0.8 02/10/2015 1202   CREATININE 0.79 01/27/2011 1923      Component Value Date/Time   CALCIUM 8.9 02/10/2015 1202   CALCIUM 9.2 01/27/2011 1923   ALKPHOS 83 02/10/2015 1202   ALKPHOS 68 01/27/2011 1923   AST 48* 02/10/2015 1202   AST 64* 01/27/2011 1923   ALT 68* 02/10/2015 1202   ALT 89* 01/27/2011  1923   BILITOT 0.39 02/10/2015 1202   BILITOT 0.2* 01/27/2011 1923       No results found for: LABCA2  No components found for: LABCA125  No results for input(s): INR in the last 168 hours.  Urinalysis No results found for: COLORURINE  STUDIES: Mm Diag Breast Tomo Bilateral  02/11/2015   CLINICAL DATA:  Patient underwent malignant lumpectomy of the right breast in March 2015. No current complaints.  EXAM: DIGITAL DIAGNOSTIC BILATERAL MAMMOGRAM WITH 3D TOMOSYNTHESIS AND CAD  COMPARISON:  Prior exams  ACR Breast Density Category b: There are scattered areas of fibroglandular density.  FINDINGS: Mild architectural distortion with associated surgical vascular clips in the upper outer right breast reflects the patient's surgical biopsy change. There are no discrete masses or other areas of architectural distortion. There are no suspicious calcifications.  Mammographic images were processed with CAD.  IMPRESSION: No evidence of residual/recurrent or new breast malignancy. Benign postsurgical changes on the right.  RECOMMENDATION: Diagnostic mammography in 1 year per standard post lumpectomy protocol.  I have discussed the findings and recommendations with the patient. Results were also provided in writing at the conclusion of the visit. If applicable, a reminder letter will be sent to the patient regarding the next appointment.  BI-RADS CATEGORY  2: Benign.   Electronically Signed   By: Lajean Manes M.D.   On: 02/11/2015 11:59    ASSESSMENT: 74 y.o. Jessica Bautista woman status post right breast biopsy 01/08/2014 for a clinical T1b N0, stage IA invasive ductal carcinoma, grade 2, estrogen and progesterone receptor positive, with an MIB-1 of 15% and no HER-2 amplification  (1) s/p Right lumpectomy 01/27/2014 for a pT1c NX, stage I invasive lobular carcinoma, HER-2 again negative, margins close but negative  (2) the consensus at the multidisciplinary conference 02/12/2014 was that radiation was optional;  the patient discussed this with her radiation oncologist and opted against radiation  (3) anastrozole started April 2015; normal bone  density 11/18/2013  PLAN: Jessica Bautista is doing very well from a breast cancer point of view. She has 4 years of anastrozole to go. She is doing well enough we can see her on a yearly basis. If she sees Dr. Excell Seltzer sometime in October that would be optimal.  She is due for repeat bone density January 2017 and I have entered that order.  . She has a good understanding of the overall plan. She agrees with it. She knows the goal of treatment in her case is cure. She will call with any problems that may develop before her next visit here.      Chauncey Cruel, MD   02/17/2015 3:14 PM

## 2015-02-17 NOTE — Telephone Encounter (Signed)
Appointments made and an avs was printed for the patient.  The patients dexa was last at [facility] and she will call them for her scan  Jessica Bautista

## 2015-03-05 ENCOUNTER — Other Ambulatory Visit: Payer: Self-pay | Admitting: Gastroenterology

## 2015-03-23 ENCOUNTER — Encounter: Payer: Self-pay | Admitting: Gynecology

## 2015-03-23 ENCOUNTER — Ambulatory Visit (INDEPENDENT_AMBULATORY_CARE_PROVIDER_SITE_OTHER): Payer: PPO | Admitting: Gynecology

## 2015-03-23 VITALS — BP 122/80 | Ht 64.5 in | Wt 209.0 lb

## 2015-03-23 DIAGNOSIS — Z01419 Encounter for gynecological examination (general) (routine) without abnormal findings: Secondary | ICD-10-CM

## 2015-03-23 DIAGNOSIS — N8110 Cystocele, unspecified: Secondary | ICD-10-CM

## 2015-03-23 DIAGNOSIS — L900 Lichen sclerosus et atrophicus: Secondary | ICD-10-CM | POA: Diagnosis not present

## 2015-03-23 DIAGNOSIS — IMO0002 Reserved for concepts with insufficient information to code with codable children: Secondary | ICD-10-CM | POA: Insufficient documentation

## 2015-03-23 DIAGNOSIS — Z853 Personal history of malignant neoplasm of breast: Secondary | ICD-10-CM

## 2015-03-23 MED ORDER — CLOBETASOL PROPIONATE 0.05 % EX OINT: TOPICAL_OINTMENT | CUTANEOUS | Status: DC

## 2015-03-23 NOTE — Progress Notes (Addendum)
Jessica Bautista 24-Aug-1941 [id number]   History:    74 y.o.  for breast and pelvic exam with no complaints today.Her primary care physician is Dr. Rex Kras at [facility] who has been doing her blood work. Patient was diagnosed with breast cancer in 2015. It was diagnosed during a screening mammogram with had shown the following:status post right breast biopsy 01/08/2014 for a clinical T1b N0, stage IA invasive ductal carcinoma, grade 2, estrogen and progesterone receptor positive, with an MIB-1 of 15% and no HER-2 amplification  (1) s/p Right lumpectomy 01/27/2014 for a pT1c NX, stage I invasive lobular carcinoma, HER-2 again negative, margins close but negative.  the consensus at the multidisciplinary conference 02/12/2014 was that radiation was optional; the patient opted against radiation and was started on anastrozole to start April 2015   Patient stated that she had a colonoscopy in 2010 benign polyps were removed. Her gastroenterologist is Dr. Amedeo Plenty. Patient stated that over 30 years ago she had severe cervical dysplasia and had cervical conization. Subsequent Pap smear until 2009 have been normal. Patient had a normal bone density study in 2015.  Patient has been diagnosed in the past with vulvar lichen sclerosis for which she takes clobetasol which she applies once weekly.  Past medical history,surgical history, family history and social history were all reviewed and documented in the EPIC chart.  Gynecologic History No LMP recorded. Patient is postmenopausal. Contraception: post menopausal status Last Pap: 2015. Results were: normal Last mammogram: 2016. Results were: normal  Obstetric History OB History  Gravida Para Term Preterm AB SAB TAB Ectopic Multiple Living  $Remov'5 5        5    'pEUZCw$ # Outcome Date GA Lbr Len/2nd Weight Sex Delivery Anes PTL Lv  5 Para           4 Para           3 Para           2 Para           1 Para                ROS: A ROS was  performed and pertinent positives and negatives are included in the history.  GENERAL: No fevers or chills. HEENT: No change in vision, no earache, sore throat or sinus congestion. NECK: No pain or stiffness. CARDIOVASCULAR: No chest pain or pressure. No palpitations. PULMONARY: No shortness of breath, cough or wheeze. GASTROINTESTINAL: No abdominal pain, nausea, vomiting or diarrhea, melena or bright red blood per rectum. GENITOURINARY: No urinary frequency, urgency, hesitancy or dysuria. MUSCULOSKELETAL: No joint or muscle pain, no back pain, no recent trauma. DERMATOLOGIC: No rash, no itching, no lesions. ENDOCRINE: No polyuria, polydipsia, no heat or cold intolerance. No recent change in weight. HEMATOLOGICAL: No anemia or easy bruising or bleeding. NEUROLOGIC: No headache, seizures, numbness, tingling or weakness. PSYCHIATRIC: No depression, no loss of interest in normal activity or change in sleep pattern.     Exam: chaperone present  BP 122/80 mmHg  Ht 5' 4.5" (1.638 m)  Wt 209 lb (94.802 kg)  BMI 35.33 kg/m2  Body mass index is 35.33 kg/(m^2).  General appearance : Well developed well nourished female. No acute distress HEENT: Eyes: no retinal hemorrhage or exudates,  Neck supple, trachea midline, no carotid bruits, no thyroidmegaly Lungs: Clear to auscultation, no rhonchi or wheezes, or rib retractions  Heart: Regular rate and rhythm, no murmurs or gallops Breast:Examined in sitting and  supine position were symmetrical in appearance, no palpable masses or tenderness,  no skin retraction, no nipple inversion, no nipple discharge, no skin discoloration, no axillary or supraclavicular lymphadenopathy Abdomen: no palpable masses or tenderness, no rebound or guarding Extremities: no edema or skin discoloration or tenderness  Pelvic:  Bartholin, Urethra, Skene Glands: Within normal limits             Vagina: No gross lesions or discharge, cystocele, atrophic changes  Cervix: No gross  lesions or discharge  Uterus  anteverted, normal size, shape and consistency, non-tender and mobile  Adnexa  Without masses or tenderness  Anus and perineum  normal   Rectovaginal  normal sphincter tone without palpated masses or tenderness             Hemoccult PCP provides     Assessment/Plan:  74 y.o. female with history of right lumpectomy 01/27/2014 for stage I invasive lobular cancer currently on Arimedex and being followed by her medical oncologist. Patient with normal Pap smear negative HPV screen 2015. Patient with vulvar biopsy proven lichen sclerosis. It was noted today the new the periclitoral and upper portion labia minora lichen sclerosis was evident. All the remainder of the external genitalia she has been treated in the past has cleared. I'm going to ask her to apply the clobetasol every night to this area for one week and then to apply 3 times a week and returned back to the office in 3 months for follow-up. Her PCP will be drawn her blood work. Since her breast cancer was  estrogen progesterone receptor positive if he would recommend we'll prophylactic laparoscopic bilateral salpingo-oophorectomy. I do not see any BRCA one BRCA2 studies.  Terrance Mass MD, 11:21 AM 03/23/2015

## 2015-03-23 NOTE — Patient Instructions (Signed)
BRCA-1 and BRCA-2 BRCA-1 and BRCA-2 are 2 genes that are linked with hereditary breast and ovarian cancers. About 200,000 women are diagnosed with invasive breast cancer each year and about 23,000 with ovarian cancer (according to the American Cancer Society). Of these cancers, about 5% to 10% will be due to a mutation in one of the BRCA genes. Men can also inherit an increased risk of developing breast cancer, primarily from an alteration in the BRCA-2 gene.  Individuals with mutations in BRCA1 or BRCA2 have significantly elevated risks for breast cancer (up to 80% lifetime risk), ovarian cancer (up to 40% lifetime risk), bilateral breast cancer and other types of cancers. BRCA mutations are inherited and passed from generation to generation. One half of the time, they are passed from the father's side of the family.  The DNA in white blood cells is used to detect mutations in the BRCA genes. While the gene products (proteins) of the BRCA genes act only in breast and ovarian tissue, the genes are present in every cell of the body and blood is the most easily accessible source of that DNA. PREPARATION FOR TEST The test for BRCA mutations is done on a blood sample collected by needle from a vein in the arm. The test does not require surgical biopsy of breast or ovarian tissue.  NORMAL FINDINGS No genetic mutations. Ranges for normal findings may vary among different laboratories and hospitals. You should always check with your doctor after having lab work or other tests done to discuss the meaning of your test results and whether your values are considered within normal limits. MEANING OF TEST  Your caregiver will go over the test results with you and discuss the importance and meaning of your results, as well as treatment options and the need for additional tests if necessary. OBTAINING THE TEST RESULTS It is your responsibility to obtain your test results. Ask the lab or department performing the test  when and how you will get your results. OTHER THINGS TO KNOW Your test results may have implications for other family members. When one member of a family is tested for BRCA mutations, issues often arise about how or whether to share this information with other family members. Seek advice from a genetic counselor about communication of result with your family members.  Pre and post test consultation with a health care provider knowledgeable about genetic testing cannot be overemphasized.  There are many issues to be considered when preparing for a genetic test and upon learning the results, and a genetic counselor has the knowledge and experience to help you sort through them.  If the BRCA test is positive, the options include increased frequency of check-ups (e.g., mammography, blood tests for CA-125, or transvaginal ultrasonography); medications that could reduce risk (e.g., oral contraceptives or tamoxifen); or surgical removal of the ovaries or breasts. There are a number of variables involved and it is important to discuss your options with your doctor and genetic counselor. Research studies have reported that for every 1000 women negative for BRCA mutations, between 12 and 45 of them will develop breast cancer by age 50 and between 3 and 4 will develop ovarian cancer by age 50. The risk increases with age. The test can be ordered by a doctor, preferably by one who can also offer genetic counseling. The blood sample will be sent to a laboratory that specializes in BRCA testing. The American Society of Clinical Oncology and the National Breast Cancer Coalition encourage women seeking the   test to participate in long-term outcome studies to help gather information on the effectiveness of different check-up and treatment options. Document Released: 11/17/2004 Document Revised: 01/16/2012 Document Reviewed: 01/24/2014 [employer] Patient Information 2015 [place], [place]. This information is not intended to  replace advice given to you by your health care provider. Make sure you discuss any questions you have with your health care provider.

## 2015-03-25 ENCOUNTER — Other Ambulatory Visit: Payer: Self-pay | Admitting: Oncology

## 2015-04-10 ENCOUNTER — Other Ambulatory Visit: Payer: Self-pay | Admitting: Family Medicine

## 2015-04-10 DIAGNOSIS — R748 Abnormal levels of other serum enzymes: Secondary | ICD-10-CM

## 2015-04-14 ENCOUNTER — Ambulatory Visit
Admission: RE | Admit: 2015-04-14 | Discharge: 2015-04-14 | Disposition: A | Discharge status: Home / Self Care | Payer: PPO | Source: Ambulatory Visit | Attending: Family Medicine | Admitting: Family Medicine

## 2015-04-14 DIAGNOSIS — R748 Abnormal levels of other serum enzymes: Secondary | ICD-10-CM

## 2015-04-14 MED ORDER — IOPAMIDOL (ISOVUE-300) INJECTION 61%
100.0000 mL | Freq: Once | INTRAVENOUS | Status: AC | PRN
  Administered 2015-04-14: 100 mL via INTRAVENOUS

## 2015-05-27 ENCOUNTER — Ambulatory Visit (INDEPENDENT_AMBULATORY_CARE_PROVIDER_SITE_OTHER): Payer: PPO | Admitting: Ophthalmology

## 2015-05-27 DIAGNOSIS — H3531 Nonexudative age-related macular degeneration: Secondary | ICD-10-CM

## 2015-05-27 DIAGNOSIS — H43813 Vitreous degeneration, bilateral: Secondary | ICD-10-CM

## 2015-05-27 DIAGNOSIS — H35033 Hypertensive retinopathy, bilateral: Secondary | ICD-10-CM

## 2015-05-27 DIAGNOSIS — I10 Essential (primary) hypertension: Secondary | ICD-10-CM

## 2015-06-24 ENCOUNTER — Encounter: Payer: Self-pay | Admitting: Gynecology

## 2015-06-24 ENCOUNTER — Ambulatory Visit (INDEPENDENT_AMBULATORY_CARE_PROVIDER_SITE_OTHER): Payer: PPO | Admitting: Gynecology

## 2015-06-24 VITALS — BP 138/80 | Ht 64.0 in | Wt 203.0 lb

## 2015-06-24 DIAGNOSIS — N904 Leukoplakia of vulva: Secondary | ICD-10-CM | POA: Diagnosis not present

## 2015-06-24 MED ORDER — CLOBETASOL PROPIONATE 0.05 % EX OINT: TOPICAL_OINTMENT | CUTANEOUS | Status: DC

## 2015-06-24 NOTE — Progress Notes (Signed)
   Patient is a 74 year old who was instructed today for follow-up vulvar exam as a result of her history of lichen sclerosus and atrophicus that was diagnosed in 2015. She's currently applying clobetasol 0.05% 3 times a week and has no issues with pruritus or irritation.  Exam: Blood pressure 130/80   weight 203 pounds   height 5 feet 4 inches   BMI 34.84 kg for meter square Gen. appearance well-developed well-nourished female in no acute distress Extensive external genital examination had only demonstrated decreased pigmentation in a slightly annular fashion around the external genital and perineum which was biopsied last year no change but quite improvement since last year.  Patient in 2014 had been diagnosed with breast cancer as follows:  status post right breast biopsy 01/08/2014 for a clinical T1b N0, stage IA invasive ductal carcinoma, grade 2, estrogen and progesterone receptor positive, with an MIB-1 of 15% and no HER-2 amplification  (1) s/p Right lumpectomy 01/27/2014 for a pT1c NX, stage I invasive lobular carcinoma, HER-2 again negative, margins close but negative.  the consensus at the multidisciplinary conference 02/12/2014 was that radiation was optional; the patient opted against radiation and was started on anastrozole to start April 2015   She was reminded to speak with her oncologist at the next visit to see if they did a BRCA one BRCA2 gene mutation analysis. Also since of breast cancer with both estrogen and progesterone receptor positive and they would recommend prophylactic a lateral salpingo-oophorectomy. Literature information was provided. Prescription refill for clobetasol was provided she is to apply 3 times a week.

## 2015-06-24 NOTE — Patient Instructions (Signed)
BRCA-1 and BRCA-2 BRCA-1 and BRCA-2 are 2 genes that are linked with hereditary breast and ovarian cancers. About 200,000 women are diagnosed with invasive breast cancer each year and about 23,000 with ovarian cancer (according to the American Cancer Society). Of these cancers, about 5% to 10% will be due to a mutation in one of the BRCA genes. Men can also inherit an increased risk of developing breast cancer, primarily from an alteration in the BRCA-2 gene.  Individuals with mutations in BRCA1 or BRCA2 have significantly elevated risks for breast cancer (up to 80% lifetime risk), ovarian cancer (up to 40% lifetime risk), bilateral breast cancer and other types of cancers. BRCA mutations are inherited and passed from generation to generation. One half of the time, they are passed from the father's side of the family.  The DNA in white blood cells is used to detect mutations in the BRCA genes. While the gene products (proteins) of the BRCA genes act only in breast and ovarian tissue, the genes are present in every cell of the body and blood is the most easily accessible source of that DNA. PREPARATION FOR TEST The test for BRCA mutations is done on a blood sample collected by needle from a vein in the arm. The test does not require surgical biopsy of breast or ovarian tissue.  NORMAL FINDINGS No genetic mutations. Ranges for normal findings may vary among different laboratories and hospitals. You should always check with your doctor after having lab work or other tests done to discuss the meaning of your test results and whether your values are considered within normal limits. MEANING OF TEST  Your caregiver will go over the test results with you and discuss the importance and meaning of your results, as well as treatment options and the need for additional tests if necessary. OBTAINING THE TEST RESULTS It is your responsibility to obtain your test results. Ask the lab or department performing the test  when and how you will get your results. OTHER THINGS TO KNOW Your test results may have implications for other family members. When one member of a family is tested for BRCA mutations, issues often arise about how or whether to share this information with other family members. Seek advice from a genetic counselor about communication of result with your family members.  Pre and post test consultation with a health care provider knowledgeable about genetic testing cannot be overemphasized.  There are many issues to be considered when preparing for a genetic test and upon learning the results, and a genetic counselor has the knowledge and experience to help you sort through them.  If the BRCA test is positive, the options include increased frequency of check-ups (e.g., mammography, blood tests for CA-125, or transvaginal ultrasonography); medications that could reduce risk (e.g., oral contraceptives or tamoxifen); or surgical removal of the ovaries or breasts. There are a number of variables involved and it is important to discuss your options with your doctor and genetic counselor. Research studies have reported that for every 1000 women negative for BRCA mutations, between 12 and 45 of them will develop breast cancer by age 50 and between 3 and 4 will develop ovarian cancer by age 50. The risk increases with age. The test can be ordered by a doctor, preferably by one who can also offer genetic counseling. The blood sample will be sent to a laboratory that specializes in BRCA testing. The American Society of Clinical Oncology and the National Breast Cancer Coalition encourage women seeking the   test to participate in long-term outcome studies to help gather information on the effectiveness of different check-up and treatment options. Document Released: 11/17/2004 Document Revised: 01/16/2012 Document Reviewed: 01/24/2014 [employer] Patient Information 2015 [place], [place]. This information is not intended to  replace advice given to you by your health care provider. Make sure you discuss any questions you have with your health care provider.

## 2015-09-17 ENCOUNTER — Telehealth: Payer: Self-pay | Admitting: *Deleted

## 2015-09-17 NOTE — Telephone Encounter (Signed)
  Oncology Nurse Navigator Documentation    Navigator Encounter Type: Telephone (09/17/15 1544)         Interventions: Other (Left voicemail with contact information) (09/17/15 1544)  I called patient to check in.  I left a voice mail message with contact information and request that she return my call.         Time Spent with Patient: 15 (09/17/15 1544)

## 2015-10-15 ENCOUNTER — Other Ambulatory Visit: Payer: Self-pay

## 2015-10-15 DIAGNOSIS — C50411 Malignant neoplasm of upper-outer quadrant of right female breast: Secondary | ICD-10-CM

## 2015-10-15 MED ORDER — ANASTROZOLE 1 MG PO TABS: 1.0000 mg | ORAL_TABLET | Freq: Every day | ORAL | Status: DC

## 2015-10-29 ENCOUNTER — Telehealth: Payer: Self-pay | Admitting: *Deleted

## 2015-10-29 NOTE — Telephone Encounter (Signed)
  Oncology Nurse Navigator Documentation    Navigator Encounter Type: Telephone (10/29/15 1400)     Barriers/Navigation Needs: No barriers at this time (10/29/15 1400)   Interventions: None required (10/29/15 1400)  I called patient to check in.  Patient reports that she is doing OK.  She denies any questions or concerns at this time.  I encouraged her to call me if she has any needs.         Time Spent with Patient: 15 (10/29/15 1400)

## 2015-11-12 ENCOUNTER — Other Ambulatory Visit: Payer: Self-pay | Admitting: *Deleted

## 2015-11-12 MED ORDER — ESCITALOPRAM OXALATE 20 MG PO TABS: 20.0000 mg | ORAL_TABLET | Freq: Every day | ORAL | Status: DC

## 2015-12-02 ENCOUNTER — Telehealth: Payer: Self-pay | Admitting: *Deleted

## 2015-12-02 NOTE — Telephone Encounter (Signed)
  Oncology Nurse Navigator Documentation  Navigator Location: [facility] (12/02/15 1254) Navigator Encounter Type: Telephone (12/02/15 1254)  Patient called this am and left a voice mail to verify that she has a bone density scan scheduled for Thursday.  I checked patient's schedule and saw one scheduled for February 21.  I called patient back to share the information and she said that she called the imaging center after leaving me the message and learned that they had the order but it had not been scheduled.  She then scheduled the appointment in February.  Patient reports that she is doing well and has no other needs or concerns at this time.  I encouraged her to call me for any assistance.           Treatment Phase: Follow-up (12/02/15 1254) Barriers/Navigation Needs: Coordination of Care (12/02/15 1254)   Interventions: None required (12/02/15 1254)            Acuity: Level 1 (12/02/15 1254) Acuity Level 1: Minimal follow up required (12/02/15 1254)       Time Spent with Patient: 15 (12/02/15 1254)

## 2015-12-10 ENCOUNTER — Other Ambulatory Visit: Payer: Self-pay | Admitting: Oncology

## 2015-12-23 DIAGNOSIS — I10 Essential (primary) hypertension: Secondary | ICD-10-CM | POA: Diagnosis not present

## 2015-12-23 DIAGNOSIS — E559 Vitamin D deficiency, unspecified: Secondary | ICD-10-CM | POA: Diagnosis not present

## 2015-12-23 DIAGNOSIS — Z0000 Encounter for general adult medical examination without abnormal findings: Secondary | ICD-10-CM | POA: Diagnosis not present

## 2015-12-23 DIAGNOSIS — F338 Other recurrent depressive disorders: Secondary | ICD-10-CM | POA: Diagnosis not present

## 2015-12-23 DIAGNOSIS — E039 Hypothyroidism, unspecified: Secondary | ICD-10-CM | POA: Diagnosis not present

## 2015-12-23 DIAGNOSIS — E782 Mixed hyperlipidemia: Secondary | ICD-10-CM | POA: Diagnosis not present

## 2015-12-23 DIAGNOSIS — R7301 Impaired fasting glucose: Secondary | ICD-10-CM | POA: Diagnosis not present

## 2015-12-23 DIAGNOSIS — K760 Fatty (change of) liver, not elsewhere classified: Secondary | ICD-10-CM | POA: Diagnosis not present

## 2015-12-23 DIAGNOSIS — Z853 Personal history of malignant neoplasm of breast: Secondary | ICD-10-CM | POA: Diagnosis not present

## 2015-12-29 ENCOUNTER — Ambulatory Visit
Admission: RE | Admit: 2015-12-29 | Discharge: 2015-12-29 | Disposition: A | Discharge status: Home / Self Care | Payer: PPO | Source: Ambulatory Visit | Attending: Oncology | Admitting: Oncology

## 2015-12-29 DIAGNOSIS — C50411 Malignant neoplasm of upper-outer quadrant of right female breast: Secondary | ICD-10-CM

## 2015-12-29 DIAGNOSIS — M85851 Other specified disorders of bone density and structure, right thigh: Secondary | ICD-10-CM | POA: Diagnosis not present

## 2016-01-04 ENCOUNTER — Other Ambulatory Visit: Payer: Self-pay | Admitting: Family Medicine

## 2016-01-04 DIAGNOSIS — Z853 Personal history of malignant neoplasm of breast: Secondary | ICD-10-CM

## 2016-02-03 ENCOUNTER — Telehealth: Payer: Self-pay | Admitting: *Deleted

## 2016-02-03 NOTE — Telephone Encounter (Signed)
  Oncology Nurse Navigator Documentation  Navigator Location: [facility] (02/03/16 1556) Navigator Encounter Type: Telephone (02/03/16 1556)  I called patient to check in.  She reports that she is doing well.  We reviewed her appointments for mammogram, labs and follow up appointment with NP in April.  She denies any questions or concerns at this time.  I encouraged her to call me for any needs.           Treatment Phase: Follow-up (02/03/16 1556) Barriers/Navigation Needs: No barriers at this time (02/03/16 1556)   Interventions: None required (02/03/16 1556)            Acuity: Level 1 (02/03/16 1556) Acuity Level 1: Minimal follow up required (02/03/16 1556)       Time Spent with Patient: 15 (02/03/16 1556)

## 2016-02-16 ENCOUNTER — Ambulatory Visit
Admission: RE | Admit: 2016-02-16 | Discharge: 2016-02-16 | Disposition: A | Discharge status: Home / Self Care | Payer: PPO | Source: Ambulatory Visit | Attending: Family Medicine | Admitting: Family Medicine

## 2016-02-16 DIAGNOSIS — R928 Other abnormal and inconclusive findings on diagnostic imaging of breast: Secondary | ICD-10-CM | POA: Diagnosis not present

## 2016-02-16 DIAGNOSIS — Z853 Personal history of malignant neoplasm of breast: Secondary | ICD-10-CM

## 2016-02-23 ENCOUNTER — Other Ambulatory Visit: Payer: Self-pay | Admitting: Nurse Practitioner

## 2016-02-23 ENCOUNTER — Other Ambulatory Visit (HOSPITAL_BASED_OUTPATIENT_CLINIC_OR_DEPARTMENT_OTHER): Payer: PPO

## 2016-02-23 DIAGNOSIS — C50411 Malignant neoplasm of upper-outer quadrant of right female breast: Secondary | ICD-10-CM

## 2016-02-23 DIAGNOSIS — Z853 Personal history of malignant neoplasm of breast: Secondary | ICD-10-CM | POA: Diagnosis not present

## 2016-02-23 LAB — COMPREHENSIVE METABOLIC PANEL
ALT: 55 U/L (ref 0–55)
AST: 41 U/L — AB (ref 5–34)
Albumin: 3.9 g/dL (ref 3.5–5.0)
Alkaline Phosphatase: 73 U/L (ref 40–150)
Anion Gap: 7 mEq/L (ref 3–11)
BILIRUBIN TOTAL: 0.7 mg/dL (ref 0.20–1.20)
BUN: 12.9 mg/dL (ref 7.0–26.0)
CHLORIDE: 103 meq/L (ref 98–109)
CO2: 29 meq/L (ref 22–29)
CREATININE: 0.8 mg/dL (ref 0.6–1.1)
Calcium: 9.8 mg/dL (ref 8.4–10.4)
EGFR: 71 mL/min/{1.73_m2} — ABNORMAL LOW (ref >90)
GLUCOSE: 103 mg/dL (ref 70–140)
Potassium: 4.4 mEq/L (ref 3.5–5.1)
SODIUM: 139 meq/L (ref 136–145)
TOTAL PROTEIN: 7 g/dL (ref 6.4–8.3)

## 2016-02-23 LAB — CBC WITH DIFFERENTIAL/PLATELET
BASO%: 0.8 % (ref 0.0–2.0)
Basophils Absolute: 0 10*3/uL (ref 0.0–0.1)
EOS%: 4.7 % (ref 0.0–7.0)
Eosinophils Absolute: 0.3 10*3/uL (ref 0.0–0.5)
HCT: 38.5 % (ref 34.8–46.6)
HGB: 12.7 g/dL (ref 11.6–15.9)
LYMPH#: 1.5 10*3/uL (ref 0.9–3.3)
LYMPH%: 29 % (ref 14.0–49.7)
MCH: 32.9 pg (ref 25.1–34.0)
MCHC: 33 g/dL (ref 31.5–36.0)
MCV: 99.7 fL (ref 79.5–101.0)
MONO#: 0.4 10*3/uL (ref 0.1–0.9)
MONO%: 7.6 % (ref 0.0–14.0)
NEUT%: 57.9 % (ref 38.4–76.8)
NEUTROS ABS: 3.1 10*3/uL (ref 1.5–6.5)
Platelets: 204 10*3/uL (ref 145–400)
RBC: 3.86 10*6/uL (ref 3.70–5.45)
RDW: 13.1 % (ref 11.2–14.5)
WBC: 5.3 10*3/uL (ref 3.9–10.3)

## 2016-03-01 ENCOUNTER — Encounter: Payer: Self-pay | Admitting: Nurse Practitioner

## 2016-03-01 ENCOUNTER — Ambulatory Visit (HOSPITAL_BASED_OUTPATIENT_CLINIC_OR_DEPARTMENT_OTHER): Payer: PPO | Admitting: Nurse Practitioner

## 2016-03-01 ENCOUNTER — Telehealth: Payer: Self-pay | Admitting: Nurse Practitioner

## 2016-03-01 VITALS — BP 139/61 | HR 67 | Temp 98.3°F | Resp 18 | Wt 206.0 lb

## 2016-03-01 DIAGNOSIS — Z853 Personal history of malignant neoplasm of breast: Secondary | ICD-10-CM

## 2016-03-01 DIAGNOSIS — Z79811 Long term (current) use of aromatase inhibitors: Secondary | ICD-10-CM

## 2016-03-01 DIAGNOSIS — C50411 Malignant neoplasm of upper-outer quadrant of right female breast: Secondary | ICD-10-CM

## 2016-03-01 DIAGNOSIS — M8580 Other specified disorders of bone density and structure, unspecified site: Secondary | ICD-10-CM

## 2016-03-01 NOTE — Progress Notes (Signed)
[facility]  [phone] Fax:(336) [phone]     ID: Jessica Bautista OB: October 15, 1941  MR#: [id number]  TRV#:[id number]  PCP: Gennette Pac, MD GYN:   SU: Excell Seltzer OTHER MD: Eppie Gibson  CHIEF COMPLAINT: Early stage estrogen receptor positive breast cancer  CURRENT TREATMENT: Anastrozole  BREAST CANCER HISTORY: From the original intake note:  Jessica Bautista had routine bilateral screening mammography at the breast Center to 01/24/2014. A possible mass in the right breast was noted, and she was recalled for a right diagnostic mammogram and ultrasound 01-2014. This showed an irregular 8 mm mass in the lateral right breast, with a second more circumscribed 7 mm mass inferiorly, which was stable. There was no palpable mass by exam. Ultrasound showed a 7 mm irregular hypoechoic mass in the right breast upper outer quadrant. The second area noted on mammography was a simple cyst. There was no right axillary adenopathy.  Biopsy of the questionable mass 01/08/2014 showed ([id number]) an invasive ductal carcinoma with papillary features, grade 2estrogen and progesterone receptor positive, with an MIB-1 of 15 and no HER-2 amplification, the signals ratio being 1.19 in the number per cell 1.55.   The patient's subsequent history is as detailed below  INTERVAL HISTORY: Jessica Bautista returns today for follow up of her breast cancer. She has been on anastrozole since April 2015. She tolerates this drug remarkably well. She denies hot flashes, arthralgias, or myalgias. Her vaginal discomfort is the result of lichen sclerosus, not anastrozole. The interval history is generally unremarkable. She continues to care for her 69 year old husband who has dementia. This is hard on her, but she does benefit from respite care a few times a year. She is depressed, and is on 29m lexapro daily. This is certainly better than the 179mshe was on previously, but some weeks her depression is worse than  others. She manages to stay physically active several times a week, participating in yoga and [place]   REVIEW OF SYSTEMS: A detailed review of systems is otherwise entirely negative, except where noted above.  PAST MEDICAL HISTORY: Past Medical History  Diagnosis Date  . Hypertension   . Thyroid disease   . Hyperlipidemia   . Psoriasis   . Pulmonary embolus     post op tummy tuc  . Wears glasses   . Lichen sclerosus et atrophicus of the vulva   . Skin cancer   . Breast cancer     PAST SURGICAL HISTORY: Past Surgical History  Procedure Laterality Date  . Ankle fracture surgery  1998    right  . Wrist fracture surgery  1975    right  . Abdominoplasty  1998  . Tubal ligation    . Colonoscopy    . Breast lumpectomy with needle localization Right 01/27/2014    Procedure: BREAST LUMPECTOMY WITH NEEDLE LOCALIZATION;  Surgeon: BeEdward JollyMD;  [place]: [facility];  Laterality: Right;  . Breast surgery      FAMILY HISTORY Family History  Problem Relation Age of Onset  . Cancer Brother     LUNG- SM82  The patient's father died at the age of 7578ith multiple comorbidities including diabetes heart disease and COPD. The patient's mother died at the age of 989The patient had 3 brothers, one of whom died from lung cancer at the age of 5069He was a smoker. She had 2 sisters. There is no history of breast or ovarian cancer  in the family.  GYNECOLOGIC HISTORY:  Menarche age 25, first live birth age 67, the patient is [place] P5. She stopped having periods "about 20 years ago". She did not take hormone replacement. 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 Her husband Truman Hayward, is currently 37 years old. "He mostly stays at home and watches sports". Son Suezanne Jacquet lives in [place] and is in pacemaker sales. Daughter [name] lives in [place]  where she works for a Herbalist areas. Daughter [name] heritage lives in [place] and is an Glass blower/designer. Son [name] lives in [place] and is a Probation officer. Son [name] also lives in [place], and works in plumbing. The patient has multiple grandchildren. She attends [employer]    ADVANCED DIRECTIVES: In place   HEALTH MAINTENANCE: Social History  Substance Use Topics  . Smoking status: Former Smoker -- 1.00 packs/day    Types: Cigarettes    Quit date: 01/23/1983  . Smokeless tobacco: Not on file  . Alcohol Use: 1.8 oz/week    3 Glasses of wine per week     Colonoscopy:  PAP:  Bone density: 11/18/2013 at [facility], lowest T score of -0.7 (normal) at the right femoral neck  Lipid panel:  Allergies  Allergen Reactions  . Tape Itching and Rash    Adhesive Tape    Current Outpatient Prescriptions  Medication Sig Dispense Refill  . anastrozole (ARIMIDEX) 1 MG tablet Take 1 tablet (1 mg total) by mouth daily. 90 tablet 3  . escitalopram (LEXAPRO) 20 MG tablet Take 1 tablet (20 mg total) by mouth daily. 90 tablet 1  . levothyroxine (SYNTHROID, LEVOTHROID) 112 MCG tablet Take 112 mcg by mouth daily before breakfast.    . lisinopril (PRINIVIL,ZESTRIL) 10 MG tablet Take 10 mg by mouth daily.    Marland Kitchen zolpidem (AMBIEN) 10 MG tablet Take by mouth as needed.  0  . clobetasol ointment (TEMOVATE) 0.05 % Applied three times a week (Patient not taking: Reported on 03/01/2016) 30 g 5   No current facility-administered medications for this visit.    OBJECTIVE: Middle-aged white woman in no acute distress Filed Vitals:   03/01/16 1329  BP: 139/61  Pulse: 67  Temp: 98.3 F (36.8 C)  Resp: 18     Body mass index is 35.34 kg/(m^2).    ECOG FS:0 - Asymptomatic  Skin: warm, dry  HEENT: sclerae anicteric, conjunctivae pink, oropharynx clear. No thrush or mucositis.  Lymph Nodes: No cervical or supraclavicular lymphadenopathy  Lungs: clear to  auscultation bilaterally, no rales, wheezes, or rhonci  Heart: regular rate and rhythm  Abdomen: round, soft, non tender, positive bowel sounds  Musculoskeletal: No focal spinal tenderness, no peripheral edema  Neuro: non focal, well oriented, positive affect  Breasts: right breast status post lumpectomy. No evidence of recurrent diease. Right axilla benign. Left breast unremarkable.  LAB RESULTS:  CMP     Component Value Date/Time   NA 139 02/23/2016 1341   NA 141 01/27/2011 1923   K 4.4 02/23/2016 1341   K 4.4 01/27/2011 1923   CL 108 01/27/2011 1923   CO2 29 02/23/2016 1341   CO2 26 01/27/2011 1923   GLUCOSE 103 02/23/2016 1341   GLUCOSE 108* 01/27/2011 1923   BUN 12.9 02/23/2016 1341   BUN 17 01/27/2011 1923   CREATININE 0.8 02/23/2016 1341   CREATININE 0.79 01/27/2011  1923   CALCIUM 9.8 02/23/2016 1341   CALCIUM 9.2 01/27/2011 1923   PROT 7.0 02/23/2016 1341   PROT 7.3 01/27/2011 1923   ALBUMIN 3.9 02/23/2016 1341   ALBUMIN 4.3 01/27/2011 1923   AST 41* 02/23/2016 1341   AST 64* 01/27/2011 1923   ALT 55 02/23/2016 1341   ALT 89* 01/27/2011 1923   ALKPHOS 73 02/23/2016 1341   ALKPHOS 68 01/27/2011 1923   BILITOT 0.70 02/23/2016 1341   BILITOT 0.2* 01/27/2011 1923   GFRNONAA >60 01/27/2011 1923   GFRAA  01/27/2011 1923    >60        The eGFR has been calculated using the MDRD equation. This calculation has not been validated in all clinical situations. eGFR's persistently <60 mL/min signify possible Chronic Kidney Disease.    I No results found for: SPEP  Lab Results  Component Value Date   WBC 5.3 02/23/2016   NEUTROABS 3.1 02/23/2016   HGB 12.7 02/23/2016   HCT 38.5 02/23/2016   MCV 99.7 02/23/2016   PLT 204 02/23/2016      Chemistry      Component Value Date/Time   NA 139 02/23/2016 1341   NA 141 01/27/2011 1923   K 4.4 02/23/2016 1341   K 4.4 01/27/2011 1923   CL 108 01/27/2011 1923   CO2 29 02/23/2016 1341   CO2 26 01/27/2011 1923    BUN 12.9 02/23/2016 1341   BUN 17 01/27/2011 1923   CREATININE 0.8 02/23/2016 1341   CREATININE 0.79 01/27/2011 1923      Component Value Date/Time   CALCIUM 9.8 02/23/2016 1341   CALCIUM 9.2 01/27/2011 1923   ALKPHOS 73 02/23/2016 1341   ALKPHOS 68 01/27/2011 1923   AST 41* 02/23/2016 1341   AST 64* 01/27/2011 1923   ALT 55 02/23/2016 1341   ALT 89* 01/27/2011 1923   BILITOT 0.70 02/23/2016 1341   BILITOT 0.2* 01/27/2011 1923       No results found for: LABCA2  No components found for: LABCA125  No results for input(s): INR in the last 168 hours.  Urinalysis No results found for: COLORURINE  STUDIES: Mm Diag Breast Tomo Bilateral  02/16/2016  CLINICAL DATA:  Patient with history of right breast lumpectomy. EXAM: 2D DIGITAL DIAGNOSTIC BILATERAL MAMMOGRAM WITH CAD AND ADJUNCT TOMO COMPARISON:  Previous exam(s). ACR Breast Density Category b: There are scattered areas of fibroglandular density. FINDINGS: Stable postlumpectomy changes right breast. No concerning masses, calcifications or nonsurgical architectural distortion identified within either breast. Mammographic images were processed with CAD. IMPRESSION: No mammographic evidence for malignancy. RECOMMENDATION: Bilateral diagnostic mammography 1 year. I have discussed the findings and recommendations with the patient. Results were also provided in writing at the conclusion of the visit. If applicable, a reminder letter will be sent to the patient regarding the next appointment. BI-RADS CATEGORY  2: Benign. Electronically Signed   By: Lovey Newcomer M.D.   On: 02/16/2016 14:23    ASSESSMENT: 75 y.o. [place] woman status post right breast biopsy 01/08/2014 for a clinical T1b N0, stage IA invasive ductal carcinoma, grade 2, estrogen and progesterone receptor positive, with an MIB-1 of 15% and no HER-2 amplification  (1) s/p Right lumpectomy 01/27/2014 for a pT1c NX, stage I invasive lobular carcinoma, HER-2 again negative,  margins close but negative  (2) the consensus at the multidisciplinary conference 02/12/2014 was that radiation was optional; the patient discussed this with her radiation oncologist and opted against radiation  (3) anastrozole started April 2015;   (  a) bone density scan on 12/29/15 showed osteopenia with a t-score of -1.3  PLAN: Jessica Bautista is dong well as far as her breast cancer is concerned. She is now 2 years out from her definitive surgery with no evidence of recurrent diease. Her most recent mammogram was benign. The labs were reviewed in detail and were stable. She is tolerating the anastrozole well and will continue this drug for 5 years of antiestrogen therapy. Her most recent bone density scan showed very mild osteopenia. This test will be repeated in 2 years.  Jessica Bautista will return in 1 year for a follow up visit, following her yearly mammogram in early April 2018. She understands and agrees with this plan. She knows the goal of treatment in her case is cure. She has been encouraged to call with any issues that might arise before her next visit here.  Laurie Panda, NP   03/01/2016 2:17 PM

## 2016-03-01 NOTE — Telephone Encounter (Signed)
appt made and avs printed °

## 2016-05-03 DIAGNOSIS — M25562 Pain in left knee: Secondary | ICD-10-CM | POA: Diagnosis not present

## 2016-05-03 DIAGNOSIS — M545 Low back pain: Secondary | ICD-10-CM | POA: Diagnosis not present

## 2016-05-17 DIAGNOSIS — H353131 Nonexudative age-related macular degeneration, bilateral, early dry stage: Secondary | ICD-10-CM | POA: Diagnosis not present

## 2016-05-17 DIAGNOSIS — H04123 Dry eye syndrome of bilateral lacrimal glands: Secondary | ICD-10-CM | POA: Diagnosis not present

## 2016-05-17 DIAGNOSIS — H35033 Hypertensive retinopathy, bilateral: Secondary | ICD-10-CM | POA: Diagnosis not present

## 2016-07-07 ENCOUNTER — Ambulatory Visit (INDEPENDENT_AMBULATORY_CARE_PROVIDER_SITE_OTHER): Payer: PPO | Admitting: Ophthalmology

## 2016-07-07 DIAGNOSIS — H43813 Vitreous degeneration, bilateral: Secondary | ICD-10-CM

## 2016-07-07 DIAGNOSIS — H353131 Nonexudative age-related macular degeneration, bilateral, early dry stage: Secondary | ICD-10-CM | POA: Diagnosis not present

## 2016-07-28 ENCOUNTER — Other Ambulatory Visit: Payer: Self-pay | Admitting: Oncology

## 2016-07-28 NOTE — Telephone Encounter (Signed)
Chart reviewed.

## 2016-09-16 ENCOUNTER — Encounter: Payer: Self-pay | Admitting: Gynecology

## 2016-09-16 ENCOUNTER — Ambulatory Visit (INDEPENDENT_AMBULATORY_CARE_PROVIDER_SITE_OTHER): Payer: PPO | Admitting: Gynecology

## 2016-09-16 VITALS — BP 128/70 | Ht 64.0 in | Wt 191.0 lb

## 2016-09-16 DIAGNOSIS — M8580 Other specified disorders of bone density and structure, unspecified site: Secondary | ICD-10-CM | POA: Diagnosis not present

## 2016-09-16 DIAGNOSIS — Z01411 Encounter for gynecological examination (general) (routine) with abnormal findings: Secondary | ICD-10-CM

## 2016-09-16 DIAGNOSIS — N9089 Other specified noninflammatory disorders of vulva and perineum: Secondary | ICD-10-CM | POA: Diagnosis not present

## 2016-09-16 DIAGNOSIS — Z853 Personal history of malignant neoplasm of breast: Secondary | ICD-10-CM

## 2016-09-16 DIAGNOSIS — Z87410 Personal history of cervical dysplasia: Secondary | ICD-10-CM | POA: Diagnosis not present

## 2016-09-16 DIAGNOSIS — Z124 Encounter for screening for malignant neoplasm of cervix: Secondary | ICD-10-CM

## 2016-09-16 DIAGNOSIS — L900 Lichen sclerosus et atrophicus: Secondary | ICD-10-CM

## 2016-09-16 DIAGNOSIS — N952 Postmenopausal atrophic vaginitis: Secondary | ICD-10-CM

## 2016-09-16 MED ORDER — ALENDRONATE SODIUM 70 MG PO TABS: 70.0000 mg | ORAL_TABLET | ORAL | 11 refills | Status: DC

## 2016-09-16 MED ORDER — CLOBETASOL PROPIONATE 0.05 % EX OINT: TOPICAL_OINTMENT | CUTANEOUS | 5 refills | Status: DC

## 2016-09-16 NOTE — Progress Notes (Addendum)
Jessica Bautista [birth year] [id number]   History:    75 y.o.  for annual gyn exam with several complaints. Patient stated she noted some ulcerated areas in her external genitalia for the past several weeks they were up to 5 areas of concern now has decreased to about 3 very tender. Review of patient's record indicated she has had history of lichen sclerosus in the past and had been treated with clobetasol. Patient is not sexually active.Patient was diagnosed with breast cancer in 2015. It was diagnosed during a screening mammogram with had shown the following:status post right breast biopsy 01/08/2014 for a clinical T1b N0, stage IA invasive ductal carcinoma, grade 2, estrogen and progesterone receptor positive, with an MIB-1 of 15% and no HER-2 amplification  (1) s/p Right lumpectomy 01/27/2014 for a pT1c NX, stage I invasive lobular carcinoma, HER-2 again negative, margins close but negative. Patient currently on anastrozole which was initiated in April 2015.  I reviewed patient's bone density study that been ordered by her oncologist early this year and interesting her lowest T score was -1.3 at the right femoral neck but she had a 10 year fracture risk exceeding threshold whereby her hip fracture risk 7.2% exceeding a 3% cut all. She is currently not on any antiresorptive agent.   Patient stated that over 30 years ago she had severe cervical dysplasia and had cervical conization. Subsequent Pap smear until 2009 have been normal. Patient had a colonoscopy in 2016 and does have a history of benign polyps and she is on a 5 year recall.  Past medical history,surgical history, family history and social history were all reviewed and documented in the EPIC chart.  Gynecologic History No LMP recorded. Patient is postmenopausal. Contraception: post menopausal status Last Pap: 2015. Results were: normal Last mammogram: 2017. Results were: normal  Obstetric History present OB History  Gravida Para  Term Preterm AB Living  _0 SAB TAB Ectopic Multiple Live Births               # Outcome Date GA Lbr Len/2nd Weight Sex Delivery Anes PTL Lv  5 Para           4 Para           3 Para           2 Para           1 Para                ROS: A ROS was performed and pertinent positives and negatives are included in the history.  GENERAL: No fevers or chills. HEENT: No change in vision, no earache, sore throat or sinus congestion. NECK: No pain or stiffness. CARDIOVASCULAR: No chest pain or pressure. No palpitations. PULMONARY: No shortness of breath, cough or wheeze. GASTROINTESTINAL: No abdominal pain, nausea, vomiting or diarrhea, melena or bright red blood per rectum. GENITOURINARY: No urinary frequency, urgency, hesitancy or dysuria. MUSCULOSKELETAL: No joint or muscle pain, no back pain, no recent trauma. DERMATOLOGIC: No rash, no itching, no lesions. ENDOCRINE: No polyuria, polydipsia, no heat or cold intolerance. No recent change in weight. HEMATOLOGICAL: No anemia or easy bruising or bleeding. NEUROLOGIC: No headache, seizures, numbness, tingling or weakness. PSYCHIATRIC: No depression, no loss of interest in normal activity or change in sleep pattern.     Exam: chaperone present  BP 128/70   Ht _1  (1.626 m)   Wt 191 lb (86.6  kg)   BMI 32.79 kg/m   Body mass index is 32.79 kg/m.  General appearance : Well developed well nourished female. No acute distress HEENT: Eyes: no retinal hemorrhage or exudates,  Neck supple, trachea midline, no carotid bruits, no thyroidmegaly Lungs: Clear to auscultation, no rhonchi or wheezes, or rib retractions  Heart: Regular rate and rhythm, no murmurs or gallops Breast:Examined in sitting and supine position were symmetrical in appearance, no palpable masses or tenderness,  no skin retraction, no nipple inversion, no nipple discharge, no skin discoloration, no axillary or supraclavicular lymphadenopathy Abdomen: no palpable masses or  tenderness, no rebound or guarding Extremities: no edema or skin discoloration or tenderness  Pelvic: Physical Exam  Genitourinary:        Bartholin, Urethra, Skene Glands: Within normal limits             Vagina: No gross lesions or discharge  Cervix: No gross lesions or discharge  Uterus: Anteverted normal size, shape and consistency, non-tender and mobile  Adnexa  Without masses or tenderness  Anus and perineum  normal   Rectovaginal  normal sphincter tone without palpated masses or tenderness             Hemoccult PCP provides     Assessment/Plan:  75 y.o. female for annual exam with several issues: #1 lichen sclerosus have recommended she begin applying clobetasol daily at bedtime for one week #2 ulcerated areas external genitalia patient to return back next week for colposcopy and possible biopsy rule out vulvar dysplasia #3 osteopenia with Frax analysis indicating risk of fracture at the hip based on 10 year study we discussed recommended treatment such as with any antiresorptive agent such as Fosamax 70 mg every weekly. The risks benefits and pros and cons of the medication were discussed to include osteonecrosis of the jaw and spontaneous fracture of the hip. Literature information was provided. I recommend follow-up with a bone density study one year after initiating treatment to monitor response to therapy. #4 because of her history of cervical dysplasia many years ago and now with the ulcerated areas of the external genitalia a Pap smear was done today with HPV screening.  An additional 15-20 minutes was spent discussing all the above in addition to her annual exam.   Terrance Mass MD, 4:43 PM 09/16/2016  Patient ID: Jessica Bautista, female   DOB: 03/19/1941, 75 y.o.   MRN: [id number]

## 2016-09-16 NOTE — Patient Instructions (Signed)
Osteoporosis Osteoporosis is the thinning and loss of density in the bones. Osteoporosis makes the bones more brittle, fragile, and likely to break (fracture). Over time, osteoporosis can cause the bones to become so weak that they fracture after a simple fall. The bones most likely to fracture are the bones in the hip, wrist, and spine. CAUSES  The exact cause is not known. RISK FACTORS Anyone can develop osteoporosis. You may be at greater risk if you have a family history of the condition or have poor nutrition. You may also have a higher risk if you are:   Female.   20 years old or older.  A smoker.  Not physically active.   White or Asian.  Slender. SIGNS AND SYMPTOMS  A fracture might be the first sign of the disease, especially if it results from a fall or injury that would not usually cause a bone to break. Other signs and symptoms include:   Low back and neck pain.  Stooped posture.  Height loss. DIAGNOSIS  To make a diagnosis, your health care provider may:  Take a medical history.  Perform a physical exam.  Order tests, such as:  A bone mineral density test.  A dual-energy X-ray absorptiometry test. TREATMENT  The goal of osteoporosis treatment is to strengthen your bones to reduce your risk of a fracture. Treatment may involve:  Making lifestyle changes, such as:  Eating a diet rich in calcium.  Doing weight-bearing and muscle-strengthening exercises.  Stopping tobacco use.  Limiting alcohol intake.  Taking medicine to slow the process of bone loss or to increase bone density.  Monitoring your levels of calcium and vitamin D. HOME CARE INSTRUCTIONS  Include calcium and vitamin D in your diet. Calcium is important for bone health, and vitamin D helps the body absorb calcium.  Perform weight-bearing and muscle-strengthening exercises as directed by your health care provider.  Do not use any tobacco products, including cigarettes, chewing  tobacco, and electronic cigarettes. If you need help quitting, ask your health care provider.  Limit your alcohol intake.  Take medicines only as directed by your health care provider.  Keep all follow-up visits as directed by your health care provider. This is important.  Take precautions at home to lower your risk of falling, such as:  Keeping rooms well lit and clutter free.  Installing safety rails on stairs.  Using rubber mats in the bathroom and other areas that are often wet or slippery. SEEK IMMEDIATE MEDICAL CARE IF:  You fall or injure yourself.    This information is not intended to replace advice given to you by your health care provider. Make sure you discuss any questions you have with your health care provider.   Document Released: 08/03/2005 Document Revised: 11/14/2014 Document Reviewed: 04/03/2014 Elsevier Interactive Patient Education 2016 [place]. Calcium Carbonate; Risedronate tablets What is this medicine? CALCIUM CARBONATE; RISEDRONATE (KAL see um KAR bon ate; ris ED roe nate) reduces calcium loss from bones. It is used to prevent and to treat osteoporosis. This medicine may be used for other purposes; ask your health care provider or pharmacist if you have questions. What should I tell my health care provider before I take this medicine? They need to know if you have any of these conditions: -dental disease -esophageal, stomach, or intestinal problems, like acid reflux or GERD -hyperparathyroidism -kidney stones or disease -low or high blood calcium -problems sitting or standing for 30 minutes -trouble swallowing -an unusual or allergic reaction to calcium,  risedronate, other bisphosphonates or medicines, foods, dyes, or preservatives -pregnant or trying to get pregnant -breast-feeding How should I use this medicine? Follow the directions on the prescription label. Take risedronate on the same day of the week, every week. Take calcium on the other  6 days of the week. On the day you take the risedronate: You must take this medicine exactly as directly or you will lower the amount of the medicine that you absorb into your body or you may cause yourself harm. Take this medicine by mouth first thing in the morning, after you are up for the day. Do not eat or drink anything before you take this medicine. Swallow the tablet with a full glass (6 to 8 ounces) of plain water. Do not take this medicine with any other drink. Do not chew or crush the tablet. After taking this medicine, do not eat breakfast, drink, or take any other medicines or vitamins for at least 30 minutes. Stand or sit up for at least 30 minutes after you take this medicine; do not lie down. Take this medicine on the same day every week. Do not take your medicine more often than directed. On the days you take the calcium: Take your calcium tablet with food on the 6 days of the week that you do not take risedronate. Do not take other medicines at the same time as your calcium. Talk to your pediatrician regarding the use of this medicine in children. Special care may be needed. Overdosage: If you think you have taken too much of this medicine contact a poison control center or emergency room at once. NOTE: This medicine is only for you. Do not share this medicine with others. What if I miss a dose? If you miss a dose of risedronate, take the dose on the morning after you remember. Then take your next dose on your regular scheduled day of the week. Never take 2 tablets on the same day. Do not take double or extra doses. If you miss a dose of calcium, take it as soon as you remember with your next meal. Do not take 2 tablets with the same meal. Do not take calcium and risedronate tablets at the same time. What may interact with this medicine? Do not take this medicine with any of the following medications: -ammonium chloride -methenamine This medicine may also interact with the following  medications: -antacids like aluminum hydroxide or magnesium hydroxide -antibiotics like ciprofloxacin, tetracycline and others -aspirin -calcium supplements -drugs for inflammation like ibuprofen, naproxen, and others -iron supplements -magnesium supplements -NSAIDs, medicines for pain and inflammation, like ibuprofen or naproxen -steroid medicines like prednisone or cortisone -thiazide diuretics -thyroid hormones -vitamins with minerals This list may not describe all possible interactions. Give your health care provider a list of all the medicines, herbs, non-prescription drugs, or dietary supplements you use. Also tell them if you smoke, drink alcohol, or use illegal drugs. Some items may interact with your medicine. What should I watch for while using this medicine? Visit your doctor or health care professional for regular check ups. It may be some time before you see the benefit from this medicine. Your doctor may order blood tests or other tests to see how you are doing. You should make sure that you get enough calcium and vitamin D while you are taking this medicine. Discuss the foods you eat and the vitamins you take with your health care professional. Some people who take this medicine have severe bone, joint, and/or  muscle pain. This medicine may also increase your risk for a broken thigh bone. Tell your doctor right away if you have pain in your upper leg or groin. Tell your doctor if you have any pain that does not go away or that gets worse. What side effects may I notice from receiving this medicine? Side effects that you should report to your doctor as soon as possible: -allergic reactions like skin rash, itching or hives, swelling of the face, lips, or tongue -breathing problems -black or tarry stools -changes in vision -heartburn or stomach pain -jaw pain, especially after dental work -pain or difficulty when swallowing -redness, blistering, peeling, or loosening of the  skin, including inside the mouth -unusually weak or tired Side effects that usually do not require medical attention (report to your doctor if they continue or are bothersome): -bone, muscle, or joint pain -changes in taste -diarrhea or constipation -eye pain or itching -headache -nausea or vomiting -stomach gas or fullness This list may not describe all possible side effects. Call your doctor for medical advice about side effects. You may report side effects to FDA at 1-800-FDA-1088. Where should I keep my medicine? Keep out of the reach of children. Store at room temperature between 15 and 30 degrees C (59 and 86 degrees F). Throw away any unused medicine after the expiration date. NOTE: This sheet is a summary. It may not cover all possible information. If you have questions about this medicine, talk to your doctor, pharmacist, or health care provider.    2016, Elsevier/Gold Standard. (2011-04-25 09:47:05)

## 2016-09-19 DIAGNOSIS — Z124 Encounter for screening for malignant neoplasm of cervix: Secondary | ICD-10-CM | POA: Diagnosis not present

## 2016-09-20 ENCOUNTER — Encounter: Payer: Self-pay | Admitting: Gynecology

## 2016-09-20 ENCOUNTER — Ambulatory Visit (INDEPENDENT_AMBULATORY_CARE_PROVIDER_SITE_OTHER): Payer: PPO | Admitting: Gynecology

## 2016-09-20 VITALS — BP 134/76

## 2016-09-20 DIAGNOSIS — L900 Lichen sclerosus et atrophicus: Secondary | ICD-10-CM | POA: Diagnosis not present

## 2016-09-20 DIAGNOSIS — N9089 Other specified noninflammatory disorders of vulva and perineum: Secondary | ICD-10-CM | POA: Diagnosis not present

## 2016-09-20 MED ORDER — CLOBETASOL PROPIONATE 0.05 % EX OINT: TOPICAL_OINTMENT | CUTANEOUS | 5 refills | Status: AC

## 2016-09-20 NOTE — Patient Instructions (Signed)

## 2016-09-20 NOTE — Addendum Note (Signed)
Addended by: Terrance Mass on: 09/20/2016 01:54 PM   Modules accepted: Orders

## 2016-09-20 NOTE — Progress Notes (Signed)
   Patient is a 75 year old who was instructed to come to the office today for colposcopy and possible vulvar biopsy as a result of vulvar ulcerated areas that she had noted a few days before that office visit. Patient with past history of breast cancer see previous notes for detail. Patient's last Pap smear was normal in 2015. Patient does have history of lichen sclerosus whereby in the past she had been using clobetasol but had discontinued using it.  Pelvic: Physical Exam  patient underwent detail colposcopic evaluation with the findings noted below. Betadine solution was applied to the external genitalia the inferior portion of the left labia majora and the fourchette. 1% lidocaine was injected at both points subdermally and with a keypunch biopsy both of these areas were sampled and submitted for histological evaluation. For hemostasis silver nitrate was applied and 1% lidocaine gel. We'll wait for pathology report.  Genitourinary:

## 2016-09-20 NOTE — Addendum Note (Signed)
Addended by: Thurnell Garbe A on: 09/20/2016 02:17 PM   Modules accepted: Orders

## 2016-09-21 ENCOUNTER — Other Ambulatory Visit: Payer: Self-pay

## 2016-09-21 LAB — PATHOLOGY

## 2016-09-22 LAB — PAP, TP IMAGING W/ HPV RNA, RFLX HPV TYPE 16,18/45: HPV MRNA, HIGH RISK: NOT DETECTED

## 2016-10-26 DIAGNOSIS — L570 Actinic keratosis: Secondary | ICD-10-CM | POA: Diagnosis not present

## 2016-10-26 DIAGNOSIS — D2271 Melanocytic nevi of right lower limb, including hip: Secondary | ICD-10-CM | POA: Diagnosis not present

## 2016-10-26 DIAGNOSIS — Z85828 Personal history of other malignant neoplasm of skin: Secondary | ICD-10-CM | POA: Diagnosis not present

## 2016-10-26 DIAGNOSIS — L400 Psoriasis vulgaris: Secondary | ICD-10-CM | POA: Diagnosis not present

## 2016-10-26 DIAGNOSIS — D2261 Melanocytic nevi of right upper limb, including shoulder: Secondary | ICD-10-CM | POA: Diagnosis not present

## 2016-10-26 DIAGNOSIS — D1801 Hemangioma of skin and subcutaneous tissue: Secondary | ICD-10-CM | POA: Diagnosis not present

## 2016-10-26 DIAGNOSIS — L821 Other seborrheic keratosis: Secondary | ICD-10-CM | POA: Diagnosis not present

## 2016-10-26 DIAGNOSIS — D692 Other nonthrombocytopenic purpura: Secondary | ICD-10-CM | POA: Diagnosis not present

## 2016-10-26 DIAGNOSIS — D2262 Melanocytic nevi of left upper limb, including shoulder: Secondary | ICD-10-CM | POA: Diagnosis not present

## 2016-10-26 DIAGNOSIS — I400 Infective myocarditis: Secondary | ICD-10-CM | POA: Diagnosis not present

## 2016-10-26 DIAGNOSIS — I821 Thrombophlebitis migrans: Secondary | ICD-10-CM | POA: Diagnosis not present

## 2016-10-26 DIAGNOSIS — L820 Inflamed seborrheic keratosis: Secondary | ICD-10-CM | POA: Diagnosis not present

## 2016-10-26 DIAGNOSIS — D485 Neoplasm of uncertain behavior of skin: Secondary | ICD-10-CM | POA: Diagnosis not present

## 2016-10-26 DIAGNOSIS — D225 Melanocytic nevi of trunk: Secondary | ICD-10-CM | POA: Diagnosis not present

## 2016-12-05 DIAGNOSIS — L988 Other specified disorders of the skin and subcutaneous tissue: Secondary | ICD-10-CM | POA: Diagnosis not present

## 2016-12-05 DIAGNOSIS — Z85828 Personal history of other malignant neoplasm of skin: Secondary | ICD-10-CM | POA: Diagnosis not present

## 2016-12-05 DIAGNOSIS — D485 Neoplasm of uncertain behavior of skin: Secondary | ICD-10-CM | POA: Diagnosis not present

## 2016-12-28 DIAGNOSIS — Z0000 Encounter for general adult medical examination without abnormal findings: Secondary | ICD-10-CM | POA: Diagnosis not present

## 2016-12-28 DIAGNOSIS — L820 Inflamed seborrheic keratosis: Secondary | ICD-10-CM | POA: Diagnosis not present

## 2016-12-28 DIAGNOSIS — F338 Other recurrent depressive disorders: Secondary | ICD-10-CM | POA: Diagnosis not present

## 2016-12-28 DIAGNOSIS — Z85828 Personal history of other malignant neoplasm of skin: Secondary | ICD-10-CM | POA: Diagnosis not present

## 2016-12-28 DIAGNOSIS — E039 Hypothyroidism, unspecified: Secondary | ICD-10-CM | POA: Diagnosis not present

## 2016-12-28 DIAGNOSIS — I10 Essential (primary) hypertension: Secondary | ICD-10-CM | POA: Diagnosis not present

## 2016-12-28 DIAGNOSIS — R7301 Impaired fasting glucose: Secondary | ICD-10-CM | POA: Diagnosis not present

## 2016-12-28 DIAGNOSIS — K760 Fatty (change of) liver, not elsewhere classified: Secondary | ICD-10-CM | POA: Diagnosis not present

## 2016-12-28 DIAGNOSIS — E782 Mixed hyperlipidemia: Secondary | ICD-10-CM | POA: Diagnosis not present

## 2016-12-28 DIAGNOSIS — H6120 Impacted cerumen, unspecified ear: Secondary | ICD-10-CM | POA: Diagnosis not present

## 2016-12-28 DIAGNOSIS — L239 Allergic contact dermatitis, unspecified cause: Secondary | ICD-10-CM | POA: Diagnosis not present

## 2016-12-28 DIAGNOSIS — Z853 Personal history of malignant neoplasm of breast: Secondary | ICD-10-CM | POA: Diagnosis not present

## 2016-12-28 DIAGNOSIS — E559 Vitamin D deficiency, unspecified: Secondary | ICD-10-CM | POA: Diagnosis not present

## 2017-01-17 ENCOUNTER — Other Ambulatory Visit: Payer: Self-pay | Admitting: Oncology

## 2017-01-17 DIAGNOSIS — Z853 Personal history of malignant neoplasm of breast: Secondary | ICD-10-CM

## 2017-01-31 ENCOUNTER — Telehealth: Payer: Self-pay

## 2017-01-31 DIAGNOSIS — C50411 Malignant neoplasm of upper-outer quadrant of right female breast: Secondary | ICD-10-CM

## 2017-01-31 MED ORDER — ANASTROZOLE 1 MG PO TABS: 1.0000 mg | ORAL_TABLET | Freq: Every day | ORAL | 3 refills | Status: DC

## 2017-01-31 NOTE — Telephone Encounter (Signed)
lvm refilled anastrozole at [facility]

## 2017-02-16 ENCOUNTER — Ambulatory Visit
Admission: RE | Admit: 2017-02-16 | Discharge: 2017-02-16 | Disposition: A | Discharge status: Home / Self Care | Payer: PPO | Source: Ambulatory Visit | Attending: Oncology | Admitting: Oncology

## 2017-02-16 DIAGNOSIS — Z853 Personal history of malignant neoplasm of breast: Secondary | ICD-10-CM

## 2017-02-16 DIAGNOSIS — R928 Other abnormal and inconclusive findings on diagnostic imaging of breast: Secondary | ICD-10-CM | POA: Diagnosis not present

## 2017-02-28 ENCOUNTER — Other Ambulatory Visit: Payer: PPO

## 2017-02-28 ENCOUNTER — Ambulatory Visit: Payer: PPO | Admitting: Oncology

## 2017-03-16 DIAGNOSIS — E039 Hypothyroidism, unspecified: Secondary | ICD-10-CM | POA: Diagnosis not present

## 2017-03-16 DIAGNOSIS — R7301 Impaired fasting glucose: Secondary | ICD-10-CM | POA: Diagnosis not present

## 2017-03-22 ENCOUNTER — Encounter: Payer: Self-pay | Admitting: Gynecology

## 2017-03-27 ENCOUNTER — Ambulatory Visit: Payer: PPO | Admitting: Oncology

## 2017-03-27 ENCOUNTER — Other Ambulatory Visit: Payer: PPO

## 2017-04-14 ENCOUNTER — Other Ambulatory Visit: Payer: Self-pay | Admitting: *Deleted

## 2017-04-14 ENCOUNTER — Telehealth: Payer: Self-pay | Admitting: Oncology

## 2017-04-14 ENCOUNTER — Telehealth: Payer: Self-pay | Admitting: *Deleted

## 2017-04-14 DIAGNOSIS — C50411 Malignant neoplasm of upper-outer quadrant of right female breast: Secondary | ICD-10-CM

## 2017-04-14 NOTE — Telephone Encounter (Signed)
"  My son died.  I am out of town and will nor be able to keep Monday's appointment.  I should be available anytime after April 18, 2017.  I can be reached by mobile number [phone]."    Offered condolence for her loss.  Scheduling message sent.

## 2017-04-14 NOTE — Telephone Encounter (Signed)
lvm to inform pt of of r/s appt to 6/27 at 0930 per sch msg

## 2017-04-16 DIAGNOSIS — J029 Acute pharyngitis, unspecified: Secondary | ICD-10-CM | POA: Diagnosis not present

## 2017-04-17 ENCOUNTER — Ambulatory Visit: Payer: PPO | Admitting: Oncology

## 2017-04-17 ENCOUNTER — Other Ambulatory Visit: Payer: PPO

## 2017-04-19 DIAGNOSIS — J029 Acute pharyngitis, unspecified: Secondary | ICD-10-CM | POA: Diagnosis not present

## 2017-05-03 ENCOUNTER — Ambulatory Visit (HOSPITAL_BASED_OUTPATIENT_CLINIC_OR_DEPARTMENT_OTHER): Payer: PPO | Admitting: Oncology

## 2017-05-03 ENCOUNTER — Other Ambulatory Visit (HOSPITAL_BASED_OUTPATIENT_CLINIC_OR_DEPARTMENT_OTHER): Payer: PPO

## 2017-05-03 DIAGNOSIS — Z79811 Long term (current) use of aromatase inhibitors: Secondary | ICD-10-CM

## 2017-05-03 DIAGNOSIS — C50411 Malignant neoplasm of upper-outer quadrant of right female breast: Secondary | ICD-10-CM

## 2017-05-03 DIAGNOSIS — Z853 Personal history of malignant neoplasm of breast: Secondary | ICD-10-CM | POA: Diagnosis not present

## 2017-05-03 DIAGNOSIS — I10 Essential (primary) hypertension: Secondary | ICD-10-CM | POA: Diagnosis not present

## 2017-05-03 LAB — CBC WITH DIFFERENTIAL/PLATELET
BASO%: 0.9 % (ref 0.0–2.0)
BASOS ABS: 0.1 10*3/uL (ref 0.0–0.1)
EOS ABS: 0.4 10*3/uL (ref 0.0–0.5)
EOS%: 5 % (ref 0.0–7.0)
HCT: 38.1 % (ref 34.8–46.6)
HGB: 12.8 g/dL (ref 11.6–15.9)
LYMPH%: 20 % (ref 14.0–49.7)
MCH: 31.9 pg (ref 25.1–34.0)
MCHC: 33.5 g/dL (ref 31.5–36.0)
MCV: 95.2 fL (ref 79.5–101.0)
MONO#: 0.6 10*3/uL (ref 0.1–0.9)
MONO%: 8.2 % (ref 0.0–14.0)
NEUT#: 4.9 10*3/uL (ref 1.5–6.5)
NEUT%: 65.9 % (ref 38.4–76.8)
Platelets: 258 10*3/uL (ref 145–400)
RBC: 4 10*6/uL (ref 3.70–5.45)
RDW: 13.5 % (ref 11.2–14.5)
WBC: 7.4 10*3/uL (ref 3.9–10.3)
lymph#: 1.5 10*3/uL (ref 0.9–3.3)

## 2017-05-03 LAB — COMPREHENSIVE METABOLIC PANEL
ALK PHOS: 72 U/L (ref 40–150)
ALT: 15 U/L (ref 0–55)
AST: 18 U/L (ref 5–34)
Albumin: 3.8 g/dL (ref 3.5–5.0)
Anion Gap: 9 mEq/L (ref 3–11)
BUN: 12 mg/dL (ref 7.0–26.0)
CHLORIDE: 105 meq/L (ref 98–109)
CO2: 29 mEq/L (ref 22–29)
Calcium: 9.8 mg/dL (ref 8.4–10.4)
Creatinine: 0.8 mg/dL (ref 0.6–1.1)
EGFR: 75 mL/min/{1.73_m2} — AB (ref >90)
Glucose: 109 mg/dl (ref 70–140)
POTASSIUM: 4.7 meq/L (ref 3.5–5.1)
Sodium: 143 mEq/L (ref 136–145)
Total Bilirubin: 0.71 mg/dL (ref 0.20–1.20)
Total Protein: 7.1 g/dL (ref 6.4–8.3)

## 2017-05-03 MED ORDER — ANASTROZOLE 1 MG PO TABS: 1.0000 mg | ORAL_TABLET | Freq: Every day | ORAL | 3 refills | Status: DC

## 2017-05-03 NOTE — Progress Notes (Signed)
[facility]  Telephone:([phone]) [phone] Fax:(336) [phone]     ID: Jessica Bautista OB: 1941-01-24  MR#: [id number]  GLO#:[id number]  PCP: Hulan Fess, MD GYN:   SU: Excell Seltzer OTHER MD: Eppie Gibson  CHIEF COMPLAINT: Early stage estrogen receptor positive breast cancer  CURRENT TREATMENT: Anastrozole  BREAST CANCER HISTORY: From the original intake note:  Jessica Bautista had routine bilateral screening mammography at the breast Center to 01/24/2014. A possible mass in the right breast was noted, and she was recalled for a right diagnostic mammogram and ultrasound 01-2014. This showed an irregular 8 mm mass in the lateral right breast, with a second more circumscribed 7 mm mass inferiorly, which was stable. There was no palpable mass by exam. Ultrasound showed a 7 mm irregular hypoechoic mass in the right breast upper outer quadrant. The second area noted on mammography was a simple cyst. There was no right axillary adenopathy.  Biopsy of the questionable mass 01/08/2014 showed ([id number]) an invasive ductal carcinoma with papillary features, grade 2estrogen and progesterone receptor positive, with an MIB-1 of 15 and no HER-2 amplification, the signals ratio being 1.19 in the number per cell 1.55.   The patient's subsequent history is as detailed below  INTERVAL HISTORY: Jessica Bautista returns today for follow-up of her estrogen receptor positive breast cancer. She continues on anastrozole, generally with good tolerance. Hot flashes and vaginal dryness or not a major issue. She obtains a drug at a good price.  The family has just gone through the loss of the patient's son, 58 years old, who had non-small cell lung cancer. He was living in a result, came here, and died a few weeks later. He was treated at the [facility]: Jessica Bautista had an episode of strep throat, which is still clearing. She denies difficulty swallowing or altered taste. She is not short of breath,  denies a cough. She describes herself as tired. Her husband is now 95 and has moderate dementia. She Is the main caregiver. He cannot be left alone. He tends to wake up in the middle the night which makes care difficult. Aside from these issues a detailed review of systems today was stable  PAST MEDICAL HISTORY: Past Medical History:  Diagnosis Date  . Breast cancer ([place])   . Hyperlipidemia   . Hypertension   . Lichen sclerosus et atrophicus of the vulva   . Psoriasis   . Pulmonary embolus ([place])    post op tummy tuc  . Skin cancer   . Thyroid disease   . Wears glasses     PAST SURGICAL HISTORY: Past Surgical History:  Procedure Laterality Date  . ABDOMINOPLASTY  1998  . [place]   right  . BREAST LUMPECTOMY Right 01/27/2014   malignant  . BREAST LUMPECTOMY WITH NEEDLE LOCALIZATION Right 01/27/2014   Procedure: BREAST LUMPECTOMY WITH NEEDLE LOCALIZATION;  Surgeon: Edward Jolly, MD;  Location: [facility];  Service: General;  Laterality: Right;  . BREAST SURGERY    . COLONOSCOPY    . TUBAL LIGATION    . WRIST FRACTURE SURGERY  1975   right    FAMILY HISTORY Family History  Problem Relation Age of Onset  . Cancer Brother        LUNG- 26    The patient's father died at the age of 34 with multiple comorbidities including diabetes heart disease and COPD. The patient's mother died at the age of 86. The patient  had 3 brothers, one of whom died from lung cancer at the age of 34. He was a smoker. She had 2 sisters. There is no history of breast or ovarian cancer in the family.  GYNECOLOGIC HISTORY:  Menarche age 89, first live birth age 57, the patient is [place] P5. She stopped having periods "about 20 years ago". She did not take hormone replacement. 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 Her husband Jessica Bautista, is currently 56 years old. "He  mostly stays at home and watches sports". Son Jessica Bautista lives in [place] and is in pacemaker sales. Daughter Jessica Bautista lives in [place] where she works for a Herbalist areas. Daughter Jessica Bautista lives in [place] and is an Glass blower/designer. Son Jessica Bautista lives in [place] and is a Probation officer. Son Jessica Bautista also lives in [place], and works in plumbing. The patient has multiple grandchildren. She attends [employer]    ADVANCED DIRECTIVES: In place   HEALTH MAINTENANCE: Social History  Substance Use Topics  . Smoking status: Former Smoker    Packs/day: 1.00    Types: Cigarettes    Quit date: 01/23/1983  . Smokeless tobacco: Never Used  . Alcohol use 1.8 oz/week    3 Glasses of wine per week     Colonoscopy:  PAP:  Bone density: 11/18/2013 at [facility], lowest T score of -0.7 (normal) at the right femoral neck  Lipid panel:  Allergies  Allergen Reactions  . Tape Itching and Rash    Adhesive Tape    Current Outpatient Prescriptions  Medication Sig Dispense Refill  . alendronate (FOSAMAX) 70 MG tablet Take 1 tablet (70 mg total) by mouth every 7 (seven) days. Take with a full glass of water on an empty stomach. 4 tablet 11  . anastrozole (ARIMIDEX) 1 MG tablet Take 1 tablet (1 mg total) by mouth daily. 90 tablet 3  . clobetasol ointment (TEMOVATE) 0.05 % Applied three times a week 30 g 5  . escitalopram (LEXAPRO) 20 MG tablet TAKE 1 TABLET (20 MG TOTAL) BY MOUTH DAILY. 90 tablet 1  . levothyroxine (SYNTHROID, LEVOTHROID) 112 MCG tablet Take 112 mcg by mouth daily before breakfast.    . lisinopril (PRINIVIL,ZESTRIL) 10 MG tablet Take 10 mg by mouth daily.    . Red Yeast Rice Extract (RED YEAST RICE PO) Take by mouth.    . zolpidem (AMBIEN) 10 MG tablet Take by mouth as needed.  0   No current facility-administered medications for this visit.     OBJECTIVE: Middle-aged white woman Who appears stated age  76:   05/03/17 1006   BP: (!) 144/70  Pulse: 61  Resp: 18  Temp: 98.4 F (36.9 C)     Body mass index is 32.46 kg/m.    ECOG FS:1 - Symptomatic but completely ambulatory   .Sclerae unicteric, EOMs intact Oropharynx clear and moist No cervical or supraclavicular adenopathy Lungs no rales or rhonchi Heart regular rate and rhythm Abd soft, nontender, positive bowel sounds MSK no focal spinal tenderness, no upper extremity lymphedema Neuro: nonfocal, well oriented, appropriate affect Breasts: The right breast is status post remote lumpectomy, with no evidence of local recurrence. The left breast is unremarkable. Both axillae are benign.  LAB RESULTS:  CMP     Component Value Date/Time   NA 139 02/23/2016 1341   K 4.4 02/23/2016 1341  CL 108 01/27/2011 1923   CO2 29 02/23/2016 1341   GLUCOSE 103 02/23/2016 1341   BUN 12.9 02/23/2016 1341   CREATININE 0.8 02/23/2016 1341   CALCIUM 9.8 02/23/2016 1341   PROT 7.0 02/23/2016 1341   ALBUMIN 3.9 02/23/2016 1341   AST 41 (H) 02/23/2016 1341   ALT 55 02/23/2016 1341   ALKPHOS 73 02/23/2016 1341   BILITOT 0.70 02/23/2016 1341   GFRNONAA >60 01/27/2011 1923   GFRAA  01/27/2011 1923    >60        The eGFR has been calculated using the MDRD equation. This calculation has not been validated in all clinical situations. eGFR's persistently <60 mL/min signify possible Chronic Kidney Disease.    I No results found for: SPEP  Lab Results  Component Value Date   WBC 7.4 05/03/2017   NEUTROABS 4.9 05/03/2017   HGB 12.8 05/03/2017   HCT 38.1 05/03/2017   MCV 95.2 05/03/2017   PLT 258 05/03/2017      Chemistry      Component Value Date/Time   NA 139 02/23/2016 1341   K 4.4 02/23/2016 1341   CL 108 01/27/2011 1923   CO2 29 02/23/2016 1341   BUN 12.9 02/23/2016 1341   CREATININE 0.8 02/23/2016 1341      Component Value Date/Time   CALCIUM 9.8 02/23/2016 1341   ALKPHOS 73 02/23/2016 1341   AST 41 (H) 02/23/2016 1341   ALT 55 02/23/2016  1341   BILITOT 0.70 02/23/2016 1341       No results found for: LABCA2  No components found for: LABCA125  No results for input(s): INR in the last 168 hours.  Urinalysis No results found for: COLORURINE  STUDIES: Bilateral diagnostic mammography with [facility] at [facility] 02/16/2017 finds a breast density to be category B. There was no evidence of malignancy.  ASSESSMENT: 76 y.o. [place] woman status post right breast biopsy 01/08/2014 for a clinical T1b N0, stage IA invasive ductal carcinoma, grade 2, estrogen and progesterone receptor positive, with an MIB-1 of 15% and no HER-2 amplification  (1) s/p Right lumpectomy 01/27/2014 for a pT1c NX, stage I invasive lobular carcinoma, HER-2 again negative, margins close but negative  (2) the consensus at the multidisciplinary conference 02/12/2014 was that radiation was optional; the patient discussed this with her radiation oncologist and opted against radiation  (3) anastrozole started April 2015;   (a) bone density scan on 12/29/15 showed osteopenia with a t-score of -1.3  PLAN: Viktoria is now 3 years out from definitive surgery for her breast cancer with no evidence of disease recurrence. This is very favorable.  She is tolerating anastrozole well. The plan will be to continue that for a total of 5 years.  She will be due for repeat bone density April 2019.  She knows to call for any problems that may develop before the next visit here.  Chauncey Cruel, MD   05/03/2017 10:27 AM

## 2017-05-04 ENCOUNTER — Other Ambulatory Visit: Payer: Self-pay | Admitting: Oncology

## 2017-05-17 DIAGNOSIS — H35033 Hypertensive retinopathy, bilateral: Secondary | ICD-10-CM | POA: Diagnosis not present

## 2017-05-17 DIAGNOSIS — H04123 Dry eye syndrome of bilateral lacrimal glands: Secondary | ICD-10-CM | POA: Diagnosis not present

## 2017-05-17 DIAGNOSIS — H52223 Regular astigmatism, bilateral: Secondary | ICD-10-CM | POA: Diagnosis not present

## 2017-05-17 DIAGNOSIS — H524 Presbyopia: Secondary | ICD-10-CM | POA: Diagnosis not present

## 2017-05-17 DIAGNOSIS — H5213 Myopia, bilateral: Secondary | ICD-10-CM | POA: Diagnosis not present

## 2017-05-17 DIAGNOSIS — H353131 Nonexudative age-related macular degeneration, bilateral, early dry stage: Secondary | ICD-10-CM | POA: Diagnosis not present

## 2017-06-22 ENCOUNTER — Ambulatory Visit (INDEPENDENT_AMBULATORY_CARE_PROVIDER_SITE_OTHER): Payer: PPO | Admitting: Ophthalmology

## 2017-09-11 ENCOUNTER — Other Ambulatory Visit: Payer: Self-pay | Admitting: Oncology

## 2017-12-14 DIAGNOSIS — D225 Melanocytic nevi of trunk: Secondary | ICD-10-CM | POA: Diagnosis not present

## 2017-12-14 DIAGNOSIS — D485 Neoplasm of uncertain behavior of skin: Secondary | ICD-10-CM | POA: Diagnosis not present

## 2017-12-14 DIAGNOSIS — D2272 Melanocytic nevi of left lower limb, including hip: Secondary | ICD-10-CM | POA: Diagnosis not present

## 2017-12-14 DIAGNOSIS — L814 Other melanin hyperpigmentation: Secondary | ICD-10-CM | POA: Diagnosis not present

## 2017-12-14 DIAGNOSIS — D1801 Hemangioma of skin and subcutaneous tissue: Secondary | ICD-10-CM | POA: Diagnosis not present

## 2017-12-14 DIAGNOSIS — L821 Other seborrheic keratosis: Secondary | ICD-10-CM | POA: Diagnosis not present

## 2017-12-14 DIAGNOSIS — D2262 Melanocytic nevi of left upper limb, including shoulder: Secondary | ICD-10-CM | POA: Diagnosis not present

## 2017-12-14 DIAGNOSIS — L918 Other hypertrophic disorders of the skin: Secondary | ICD-10-CM | POA: Diagnosis not present

## 2017-12-14 DIAGNOSIS — D692 Other nonthrombocytopenic purpura: Secondary | ICD-10-CM | POA: Diagnosis not present

## 2017-12-14 DIAGNOSIS — L400 Psoriasis vulgaris: Secondary | ICD-10-CM | POA: Diagnosis not present

## 2017-12-14 DIAGNOSIS — Z85828 Personal history of other malignant neoplasm of skin: Secondary | ICD-10-CM | POA: Diagnosis not present

## 2018-01-04 ENCOUNTER — Ambulatory Visit
Admission: RE | Admit: 2018-01-04 | Discharge: 2018-01-04 | Disposition: A | Discharge status: Home / Self Care | Payer: PPO | Source: Ambulatory Visit | Attending: Oncology | Admitting: Oncology

## 2018-01-04 DIAGNOSIS — C50411 Malignant neoplasm of upper-outer quadrant of right female breast: Secondary | ICD-10-CM

## 2018-01-04 DIAGNOSIS — M85852 Other specified disorders of bone density and structure, left thigh: Secondary | ICD-10-CM | POA: Diagnosis not present

## 2018-01-12 ENCOUNTER — Other Ambulatory Visit: Payer: Self-pay | Admitting: Oncology

## 2018-01-18 DIAGNOSIS — F338 Other recurrent depressive disorders: Secondary | ICD-10-CM | POA: Diagnosis not present

## 2018-01-18 DIAGNOSIS — G4700 Insomnia, unspecified: Secondary | ICD-10-CM | POA: Diagnosis not present

## 2018-01-18 DIAGNOSIS — Z853 Personal history of malignant neoplasm of breast: Secondary | ICD-10-CM | POA: Diagnosis not present

## 2018-01-18 DIAGNOSIS — S61411A Laceration without foreign body of right hand, initial encounter: Secondary | ICD-10-CM | POA: Diagnosis not present

## 2018-01-18 DIAGNOSIS — Z0000 Encounter for general adult medical examination without abnormal findings: Secondary | ICD-10-CM | POA: Diagnosis not present

## 2018-01-18 DIAGNOSIS — K760 Fatty (change of) liver, not elsewhere classified: Secondary | ICD-10-CM | POA: Diagnosis not present

## 2018-01-18 DIAGNOSIS — E039 Hypothyroidism, unspecified: Secondary | ICD-10-CM | POA: Diagnosis not present

## 2018-01-18 DIAGNOSIS — Z23 Encounter for immunization: Secondary | ICD-10-CM | POA: Diagnosis not present

## 2018-01-18 DIAGNOSIS — I10 Essential (primary) hypertension: Secondary | ICD-10-CM | POA: Diagnosis not present

## 2018-01-18 DIAGNOSIS — R7301 Impaired fasting glucose: Secondary | ICD-10-CM | POA: Diagnosis not present

## 2018-01-18 DIAGNOSIS — E782 Mixed hyperlipidemia: Secondary | ICD-10-CM | POA: Diagnosis not present

## 2018-01-18 DIAGNOSIS — R8290 Unspecified abnormal findings in urine: Secondary | ICD-10-CM | POA: Diagnosis not present

## 2018-01-23 ENCOUNTER — Other Ambulatory Visit: Payer: Self-pay | Admitting: Oncology

## 2018-01-23 DIAGNOSIS — Z9889 Other specified postprocedural states: Secondary | ICD-10-CM

## 2018-02-20 ENCOUNTER — Ambulatory Visit
Admission: RE | Admit: 2018-02-20 | Discharge: 2018-02-20 | Disposition: A | Discharge status: Home / Self Care | Payer: PPO | Source: Ambulatory Visit | Attending: Oncology | Admitting: Oncology

## 2018-02-20 DIAGNOSIS — R928 Other abnormal and inconclusive findings on diagnostic imaging of breast: Secondary | ICD-10-CM | POA: Diagnosis not present

## 2018-02-20 DIAGNOSIS — Z9889 Other specified postprocedural states: Secondary | ICD-10-CM

## 2018-03-26 ENCOUNTER — Other Ambulatory Visit: Payer: Self-pay | Admitting: Oncology

## 2018-03-29 ENCOUNTER — Other Ambulatory Visit: Payer: Self-pay | Admitting: Oncology

## 2018-05-01 NOTE — Progress Notes (Signed)
[place]  Telephone:([phone]) [phone] Fax:(336) [phone]     ID: Jessica Bautista OB: 11-23-1940  MR#: [id number]  HQP#:[id number]  Patient Care Team: Hulan Fess, MD as PCP - General (Family Medicine) Excell Seltzer, MD as Consulting Physician (General Surgery) Eppie Gibson, MD as Attending Physician (Radiation Oncology) Meredith Kilbride, Virgie Dad, MD as Consulting Physician (Oncology) OTHER MD:   CHIEF COMPLAINT: Early stage estrogen receptor positive breast cancer  CURRENT TREATMENT: Anastrozole  BREAST CANCER HISTORY: From the original intake note:  Jessica Bautista had routine bilateral screening mammography at the breast Center to 01/24/2014. A possible mass in the right breast was noted, and she was recalled for a right diagnostic mammogram and ultrasound 01-2014. This showed an irregular 8 mm mass in the lateral right breast, with a second more circumscribed 7 mm mass inferiorly, which was stable. There was no palpable mass by exam. Ultrasound showed a 7 mm irregular hypoechoic mass in the right breast upper outer quadrant. The second area noted on mammography was a simple cyst. There was no right axillary adenopathy.  Biopsy of the questionable mass 01/08/2014 showed ([id number]) an invasive ductal carcinoma with papillary features, grade 2estrogen and progesterone receptor positive, with an MIB-1 of 15 and no HER-2 amplification, the signals ratio being 1.19 in the number per cell 1.55.   The patient's subsequent history is as detailed below  INTERVAL HISTORY: Jessica Bautista returns today for follow-up of her estrogen receptor positive breast cancer.   She continues on anastrozole, with good tolerance. She denies hot flashes, vaginal dryness or discharge. She is able to obtain it at a reasonable price.   On 01/04/2018 she underwent a DEXA  Scan with a t-score of -1.1.   Additionally on 02/20/2018, she underwent a Diagnostic Bilateral Breast Mammogram with TOMO and CAD, breast density  category B, shows no evidence of malignancy. With a recommendation of a bilateral diagnostic mammogram in 1 year.   REVIEW OF SYSTEMS: Jessica Bautista is doing well overal. However, her husband has dementia and she is his caregiver. For 3 hours a day another caregiver will come so she is able to leave and do things for herself. During this time she will attend tai chi classes, which she really enjoys. The patient denies unusual headaches, visual changes, nausea, vomiting, or dizziness. There has been no unusual cough, phlegm production, or pleurisy. This been no change in bowel or bladder habits. The patient denies unexplained fatigue or unexplained weight loss, bleeding, rash, or fever. A detailed review of systems was otherwise noncontributory.    PAST MEDICAL HISTORY: Past Medical History:  Diagnosis Date  . Breast cancer ([place])   . Hyperlipidemia   . Hypertension   . Lichen sclerosus et atrophicus of the vulva   . Psoriasis   . Pulmonary embolus ([place])    post op tummy tuc  . Skin cancer   . Thyroid disease   . Wears glasses     PAST SURGICAL HISTORY: Past Surgical History:  Procedure Laterality Date  . ABDOMINOPLASTY  1998  . [place]   right  . BREAST LUMPECTOMY Right 01/27/2014   malignant  . BREAST LUMPECTOMY WITH NEEDLE LOCALIZATION Right 01/27/2014   Procedure: BREAST LUMPECTOMY WITH NEEDLE LOCALIZATION;  Surgeon: Edward Jolly, MD;  Location: [facility];  Service: General;  Laterality: Right;  . BREAST SURGERY    . COLONOSCOPY    . TUBAL LIGATION    . [place]   right  FAMILY HISTORY Family History  Problem Relation Age of Onset  . Cancer Brother        LUNG- 5    The patient's father died at the age of 41 with multiple comorbidities including diabetes heart disease and COPD. The patient's mother died at the age of 29. The patient had 3 brothers, one of whom died from lung cancer at the age of 69. He was a  smoker. She had 2 sisters. There is no history of breast or ovarian cancer in the family.  GYNECOLOGIC HISTORY:  Menarche age 62, first live birth age 72, [name] P5. She stopped having periods "about 20 years ago". She did not take hormone replacement. 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 Her husband Jessica Bautista, is currently 59 years old. "He mostly stays at home and watches sports". Son Jessica Bautista lives in [place] and is in pacemaker sales. Daughter Jessica Bautista lives in [place] where she works for a Herbalist areas. Daughter Jessica Bautista lives in [place] and is an Glass blower/designer. Son Jessica Bautista lives in [place] and is a Probation officer. Son Jessica Bautista also lives in [place], and works in plumbing. The patient has multiple grandchildren. She attends [employer]    ADVANCED DIRECTIVES: In place   HEALTH MAINTENANCE: Social History   Tobacco Use  . Smoking status: Former Smoker    Packs/day: 1.00    Types: Cigarettes    Last attempt to quit: 01/23/1983    Years since quitting: 35.2  . Smokeless tobacco: Never Used  Substance Use Topics  . Alcohol use: Yes    Alcohol/week: 1.8 oz    Types: 3 Glasses of wine per week  . Drug use: No     Colonoscopy:  PAP:  Bone density: 01/04/2018, t-score of -1.1   Lipid panel:  Allergies  Allergen Reactions  . Tape Itching and Rash    Adhesive Tape    Current Outpatient Medications  Medication Sig Dispense Refill  . anastrozole (ARIMIDEX) 1 MG tablet Take 1 tablet (1 mg total) by mouth daily. 90 tablet 3  . clobetasol ointment (TEMOVATE) 0.05 % Applied three times a week 30 g 5  . escitalopram (LEXAPRO) 20 MG tablet TAKE 1 TABLET BY MOUTH ONCE DAILY 90 tablet 0  . levothyroxine (SYNTHROID, LEVOTHROID) 112 MCG tablet Take 112 mcg by mouth daily before breakfast.    . lisinopril (PRINIVIL,ZESTRIL) 10 MG  tablet Take 10 mg by mouth daily.    . Red Yeast Rice Extract (RED YEAST RICE PO) Take by mouth.    . zolpidem (AMBIEN) 10 MG tablet Take by mouth as needed.  0   No current facility-administered medications for this visit.     OBJECTIVE: Middle-aged white woman in no acute distress  Vitals:   05/03/18 1329  BP: (!) 119/58  Pulse: 68  Resp: 18  Temp: 98.5 F (36.9 C)  SpO2: 98%     Body mass index is 32.73 kg/m.    ECOG FS:0 - Asymptomatic   Sclerae unicteric, pupils round and equal Oropharynx clear and moist No cervical or supraclavicular adenopathy Lungs no rales or rhonchi Heart regular rate and rhythm Abd soft, nontender, positive bowel sounds MSK no focal spinal tenderness, no upper extremity lymphedema Neuro: nonfocal, well oriented, appropriate affect Breasts: The right breast is status post lumpectomy.  There is no  evidence of local recurrence.  The left breast is benign.  Both axillae are benign.  LAB RESULTS:  CMP     Component Value Date/Time   NA 143 05/03/2017 0950   K 4.7 05/03/2017 0950   CL 108 01/27/2011 1923   CO2 29 05/03/2017 0950   GLUCOSE 109 05/03/2017 0950   BUN 12.0 05/03/2017 0950   CREATININE 0.8 05/03/2017 0950   CALCIUM 9.8 05/03/2017 0950   PROT 7.1 05/03/2017 0950   ALBUMIN 3.8 05/03/2017 0950   AST 18 05/03/2017 0950   ALT 15 05/03/2017 0950   ALKPHOS 72 05/03/2017 0950   BILITOT 0.71 05/03/2017 0950   GFRNONAA >60 01/27/2011 1923   GFRAA  01/27/2011 1923    >60        The eGFR has been calculated using the MDRD equation. This calculation has not been validated in all clinical situations. eGFR's persistently <60 mL/min signify possible Chronic Kidney Disease.    I No results found for: SPEP  Lab Results  Component Value Date   WBC 7.4 05/03/2017   NEUTROABS 4.9 05/03/2017   HGB 12.8 05/03/2017   HCT 38.1 05/03/2017   MCV 95.2 05/03/2017   PLT 258 05/03/2017      Chemistry      Component Value Date/Time   NA  143 05/03/2017 0950   K 4.7 05/03/2017 0950   CL 108 01/27/2011 1923   CO2 29 05/03/2017 0950   BUN 12.0 05/03/2017 0950   CREATININE 0.8 05/03/2017 0950      Component Value Date/Time   CALCIUM 9.8 05/03/2017 0950   ALKPHOS 72 05/03/2017 0950   AST 18 05/03/2017 0950   ALT 15 05/03/2017 0950   BILITOT 0.71 05/03/2017 0950       No results found for: LABCA2  No components found for: [id number]  No results for input(s): INR in the last 168 hours.  Urinalysis No results found for: COLORURINE  STUDIES: On 01/04/2018 DXA  Scan resulted with a t-score of -1.1.   02/20/2018, she underwent a Diagnostic Bilateral Breast Mammogram with TOMO and CAD, breast density category B, shows no evidence of malignancy.  ASSESSMENT: 77 y.o.  woman status post right breast biopsy 01/08/2014 for a clinical T1b N0, stage IA invasive ductal carcinoma, grade 2, estrogen and progesterone receptor positive, with an MIB-1 of 15% and no HER-2 amplification  (1) s/p Right lumpectomy 01/27/2014 for a pT1c NX, stage I invasive lobular carcinoma, HER-2 again negative, margins close but negative  (2) the consensus at the multidisciplinary conference 02/12/2014 was that radiation was optional; the patient discussed this with her radiation oncologist and opted against radiation  (3) anastrozole started April 2015;   (a) bone density scan on 12/29/15 showed osteopenia with a t-score of -1.3  (b) DEXA scan on 01/04/2018 shows a T score of -1.1  PLAN: Kaylyn i is now a little over 4 years out from definitive surgery for breast cancer with no evidence of disease recurrence.  This is favorable.  She is tolerating anastrozole well and the plan will be to continue that an additional year, after which she will "graduate" from follow-up.  We reviewed her bone density which is very favorable.  She knows to call for any problems that may develop before the next visit here.  Franck Vinal, Virgie Dad, MD  05/03/18 1:42  PM Medical Oncology and Hematology [facility] [address] [place], [place] [address] Tel. [phone]    Fax. [phone]  Artist Pais  Alben Spittle am acting as a Education administrator for Chauncey Cruel, MD.   I, Lurline Del MD, have reviewed the above documentation for accuracy and completeness, and I agree with the above.

## 2018-05-02 ENCOUNTER — Other Ambulatory Visit: Payer: Self-pay | Admitting: *Deleted

## 2018-05-02 DIAGNOSIS — C50411 Malignant neoplasm of upper-outer quadrant of right female breast: Secondary | ICD-10-CM

## 2018-05-03 ENCOUNTER — Inpatient Hospital Stay: Payer: PPO

## 2018-05-03 ENCOUNTER — Inpatient Hospital Stay: Payer: PPO | Attending: Oncology | Admitting: Oncology

## 2018-05-03 ENCOUNTER — Telehealth: Payer: Self-pay | Admitting: Oncology

## 2018-05-03 VITALS — BP 119/58 | HR 68 | Temp 98.5°F | Resp 18 | Ht 64.0 in | Wt 190.7 lb

## 2018-05-03 DIAGNOSIS — Z170 Estrogen receptor positive status [ER+]: Secondary | ICD-10-CM | POA: Diagnosis not present

## 2018-05-03 DIAGNOSIS — Z87891 Personal history of nicotine dependence: Secondary | ICD-10-CM | POA: Insufficient documentation

## 2018-05-03 DIAGNOSIS — C50411 Malignant neoplasm of upper-outer quadrant of right female breast: Secondary | ICD-10-CM | POA: Diagnosis not present

## 2018-05-03 DIAGNOSIS — Z86711 Personal history of pulmonary embolism: Secondary | ICD-10-CM | POA: Insufficient documentation

## 2018-05-03 DIAGNOSIS — I10 Essential (primary) hypertension: Secondary | ICD-10-CM

## 2018-05-03 DIAGNOSIS — Z79811 Long term (current) use of aromatase inhibitors: Secondary | ICD-10-CM | POA: Diagnosis not present

## 2018-05-03 DIAGNOSIS — Z79899 Other long term (current) drug therapy: Secondary | ICD-10-CM | POA: Insufficient documentation

## 2018-05-03 NOTE — Telephone Encounter (Signed)
Gave patient avs report and appointments for June 2020.  °

## 2018-07-18 DIAGNOSIS — L570 Actinic keratosis: Secondary | ICD-10-CM | POA: Diagnosis not present

## 2018-07-18 DIAGNOSIS — Z85828 Personal history of other malignant neoplasm of skin: Secondary | ICD-10-CM | POA: Diagnosis not present

## 2018-07-20 ENCOUNTER — Other Ambulatory Visit: Payer: Self-pay | Admitting: Oncology

## 2018-07-20 DIAGNOSIS — C50411 Malignant neoplasm of upper-outer quadrant of right female breast: Secondary | ICD-10-CM

## 2018-08-07 ENCOUNTER — Other Ambulatory Visit: Payer: Self-pay | Admitting: Oncology

## 2018-08-09 ENCOUNTER — Other Ambulatory Visit: Payer: Self-pay | Admitting: Oncology

## 2018-09-04 DIAGNOSIS — M25562 Pain in left knee: Secondary | ICD-10-CM | POA: Diagnosis not present

## 2018-10-15 ENCOUNTER — Other Ambulatory Visit: Payer: Self-pay | Admitting: Oncology

## 2018-10-15 DIAGNOSIS — C50411 Malignant neoplasm of upper-outer quadrant of right female breast: Secondary | ICD-10-CM

## 2019-01-16 ENCOUNTER — Other Ambulatory Visit: Payer: Self-pay | Admitting: Oncology

## 2019-01-16 DIAGNOSIS — L814 Other melanin hyperpigmentation: Secondary | ICD-10-CM | POA: Diagnosis not present

## 2019-01-16 DIAGNOSIS — D2262 Melanocytic nevi of left upper limb, including shoulder: Secondary | ICD-10-CM | POA: Diagnosis not present

## 2019-01-16 DIAGNOSIS — Z85828 Personal history of other malignant neoplasm of skin: Secondary | ICD-10-CM | POA: Diagnosis not present

## 2019-01-16 DIAGNOSIS — D692 Other nonthrombocytopenic purpura: Secondary | ICD-10-CM | POA: Diagnosis not present

## 2019-01-16 DIAGNOSIS — D225 Melanocytic nevi of trunk: Secondary | ICD-10-CM | POA: Diagnosis not present

## 2019-01-16 DIAGNOSIS — D1801 Hemangioma of skin and subcutaneous tissue: Secondary | ICD-10-CM | POA: Diagnosis not present

## 2019-01-16 DIAGNOSIS — C4441 Basal cell carcinoma of skin of scalp and neck: Secondary | ICD-10-CM | POA: Diagnosis not present

## 2019-01-16 DIAGNOSIS — D485 Neoplasm of uncertain behavior of skin: Secondary | ICD-10-CM | POA: Diagnosis not present

## 2019-01-16 DIAGNOSIS — Z9889 Other specified postprocedural states: Secondary | ICD-10-CM

## 2019-01-16 DIAGNOSIS — L821 Other seborrheic keratosis: Secondary | ICD-10-CM | POA: Diagnosis not present

## 2019-01-16 DIAGNOSIS — L400 Psoriasis vulgaris: Secondary | ICD-10-CM | POA: Diagnosis not present

## 2019-02-05 DIAGNOSIS — Z85828 Personal history of other malignant neoplasm of skin: Secondary | ICD-10-CM | POA: Diagnosis not present

## 2019-02-05 DIAGNOSIS — C4441 Basal cell carcinoma of skin of scalp and neck: Secondary | ICD-10-CM | POA: Diagnosis not present

## 2019-02-07 DIAGNOSIS — Z23 Encounter for immunization: Secondary | ICD-10-CM | POA: Diagnosis not present

## 2019-02-07 DIAGNOSIS — J310 Chronic rhinitis: Secondary | ICD-10-CM | POA: Diagnosis not present

## 2019-02-07 DIAGNOSIS — I739 Peripheral vascular disease, unspecified: Secondary | ICD-10-CM | POA: Diagnosis not present

## 2019-02-07 DIAGNOSIS — Z85820 Personal history of malignant melanoma of skin: Secondary | ICD-10-CM | POA: Diagnosis not present

## 2019-02-07 DIAGNOSIS — Z130 Encounter for screening for diseases of the blood and blood-forming organs and certain disorders involving the immune mechanism: Secondary | ICD-10-CM | POA: Diagnosis not present

## 2019-02-07 DIAGNOSIS — R7303 Prediabetes: Secondary | ICD-10-CM | POA: Diagnosis not present

## 2019-02-07 DIAGNOSIS — M1812 Unilateral primary osteoarthritis of first carpometacarpal joint, left hand: Secondary | ICD-10-CM | POA: Diagnosis not present

## 2019-02-07 DIAGNOSIS — Z87891 Personal history of nicotine dependence: Secondary | ICD-10-CM | POA: Diagnosis not present

## 2019-02-07 DIAGNOSIS — J439 Emphysema, unspecified: Secondary | ICD-10-CM | POA: Diagnosis not present

## 2019-02-07 DIAGNOSIS — E785 Hyperlipidemia, unspecified: Secondary | ICD-10-CM | POA: Diagnosis not present

## 2019-02-07 DIAGNOSIS — I10 Essential (primary) hypertension: Secondary | ICD-10-CM | POA: Diagnosis not present

## 2019-02-07 DIAGNOSIS — M25562 Pain in left knee: Secondary | ICD-10-CM | POA: Diagnosis not present

## 2019-02-07 DIAGNOSIS — G4700 Insomnia, unspecified: Secondary | ICD-10-CM | POA: Diagnosis not present

## 2019-02-07 DIAGNOSIS — G4730 Sleep apnea, unspecified: Secondary | ICD-10-CM | POA: Diagnosis not present

## 2019-02-07 DIAGNOSIS — R7301 Impaired fasting glucose: Secondary | ICD-10-CM | POA: Diagnosis not present

## 2019-02-07 DIAGNOSIS — I2510 Atherosclerotic heart disease of native coronary artery without angina pectoris: Secondary | ICD-10-CM | POA: Diagnosis not present

## 2019-02-07 DIAGNOSIS — Z79899 Other long term (current) drug therapy: Secondary | ICD-10-CM | POA: Diagnosis not present

## 2019-02-07 DIAGNOSIS — Z131 Encounter for screening for diabetes mellitus: Secondary | ICD-10-CM | POA: Diagnosis not present

## 2019-02-07 DIAGNOSIS — E782 Mixed hyperlipidemia: Secondary | ICD-10-CM | POA: Diagnosis not present

## 2019-02-07 DIAGNOSIS — J984 Other disorders of lung: Secondary | ICD-10-CM | POA: Diagnosis not present

## 2019-02-07 DIAGNOSIS — M1712 Unilateral primary osteoarthritis, left knee: Secondary | ICD-10-CM | POA: Diagnosis not present

## 2019-02-07 DIAGNOSIS — M79671 Pain in right foot: Secondary | ICD-10-CM | POA: Diagnosis not present

## 2019-02-07 DIAGNOSIS — F419 Anxiety disorder, unspecified: Secondary | ICD-10-CM | POA: Diagnosis not present

## 2019-02-07 DIAGNOSIS — R918 Other nonspecific abnormal finding of lung field: Secondary | ICD-10-CM | POA: Diagnosis not present

## 2019-02-07 DIAGNOSIS — E039 Hypothyroidism, unspecified: Secondary | ICD-10-CM | POA: Diagnosis not present

## 2019-02-14 DIAGNOSIS — Z853 Personal history of malignant neoplasm of breast: Secondary | ICD-10-CM | POA: Diagnosis not present

## 2019-02-14 DIAGNOSIS — R6884 Jaw pain: Secondary | ICD-10-CM | POA: Diagnosis not present

## 2019-02-14 DIAGNOSIS — F338 Other recurrent depressive disorders: Secondary | ICD-10-CM | POA: Diagnosis not present

## 2019-02-14 DIAGNOSIS — E039 Hypothyroidism, unspecified: Secondary | ICD-10-CM | POA: Diagnosis not present

## 2019-02-14 DIAGNOSIS — E782 Mixed hyperlipidemia: Secondary | ICD-10-CM | POA: Diagnosis not present

## 2019-02-14 DIAGNOSIS — R7301 Impaired fasting glucose: Secondary | ICD-10-CM | POA: Diagnosis not present

## 2019-02-14 DIAGNOSIS — I10 Essential (primary) hypertension: Secondary | ICD-10-CM | POA: Diagnosis not present

## 2019-02-14 DIAGNOSIS — K760 Fatty (change of) liver, not elsewhere classified: Secondary | ICD-10-CM | POA: Diagnosis not present

## 2019-02-14 DIAGNOSIS — G4700 Insomnia, unspecified: Secondary | ICD-10-CM | POA: Diagnosis not present

## 2019-02-19 ENCOUNTER — Other Ambulatory Visit: Payer: Self-pay | Admitting: Oncology

## 2019-02-19 DIAGNOSIS — C4441 Basal cell carcinoma of skin of scalp and neck: Secondary | ICD-10-CM | POA: Diagnosis not present

## 2019-02-19 DIAGNOSIS — D485 Neoplasm of uncertain behavior of skin: Secondary | ICD-10-CM | POA: Diagnosis not present

## 2019-02-27 ENCOUNTER — Telehealth: Payer: Self-pay

## 2019-02-27 NOTE — Telephone Encounter (Signed)

## 2019-02-28 ENCOUNTER — Other Ambulatory Visit: Payer: Self-pay

## 2019-02-28 ENCOUNTER — Ambulatory Visit
Admission: RE | Admit: 2019-02-28 | Discharge: 2019-02-28 | Disposition: A | Discharge status: Home / Self Care | Payer: PPO | Source: Ambulatory Visit | Attending: Oncology | Admitting: Oncology

## 2019-02-28 DIAGNOSIS — Z9889 Other specified postprocedural states: Secondary | ICD-10-CM | POA: Diagnosis not present

## 2019-02-28 NOTE — Progress Notes (Signed)
CARDIOLOGY CONSULT NOTE       Patient ID: [facility] MRN: [id number] DOB/AGE: 12-Jan-1941 79 y.o.  Admit date: (Not on file) Referring Physician: Hulan Fess Primary Physician: Hulan Fess, MD Primary Cardiologist: New/Jasraj Lappe Reason for Consultation: Chest pain    HPI:  78 y.o. referred by Dr Rex Kras for chest pain Reviewed his office note from 02/14/19 History of glucose intolerance, HTN and HLD. She also has thyroid disease and previous breast cancer post right lumpectomy 2015 Can get atypical pain between shoulder blades that radiates to jaw when she walks her dog Pain is still atypical no associated diaphoresis , presyncope or palpitations   Lab review from 02/07/19 normal Hct 38.1 PLT 271 Cr .74 K 5.0 TSH a bit suppressed at .18 LDL 90   ROS All other systems reviewed and negative except as noted above  Past Medical History:  Diagnosis Date   Atypical nevi    Basal cell carcinoma    Breast cancer ([facility])    DVT (deep venous thrombosis) (H[facility])    Eczema    Family history of colonic polyps    Fatty (change of) liver, not elsewhere classified    Hyperlipidemia    Hypertension    Hypothyroidism    Impaired fasting glucose    Insomnia    Lichen sclerosus et atrophicus of the vulva    Major depression, single episode    Nontoxic single thyroid nodule    Osteopenia    Psoriasis    Pulmonary embolus ([facility])    post op tummy tuc   Skin cancer    Thyroid disease    Vitamin D deficiency, unspecified    Wears glasses     Family History  Problem Relation Age of Onset   Alzheimer's disease Mother    Colon polyps Mother    Heart attack Father 68   Cancer Brother        LUNG- SMOKER    Healthy Sister    Heart attack Brother    Dementia Brother    Healthy Sister     Social History   Socioeconomic History   Marital status: Married    Spouse name: Not on file   Number of children: Not on file   Years of education: Not on file    Highest education level: Not on file  Occupational History   Not on file  Social Needs   Financial resource strain: Not on file   Food insecurity:    Worry: Not on file    Inability: Not on file   Transportation needs:    Medical: Not on file    Non-medical: Not on file  Tobacco Use   Smoking status: Former Smoker    Packs/day: 1.00    Types: Cigarettes    Last attempt to quit: 01/23/1983    Years since quitting: 36.1   Smokeless tobacco: Never Used  Substance and Sexual Activity   Alcohol use: Yes    Alcohol/week: 3.0 standard drinks    Types: 3 Glasses of wine per week   Drug use: No   Sexual activity: Never  Lifestyle   Physical activity:    Days per week: Not on file    Minutes per session: Not on file   Stress: Not on file  Relationships   Social connections:    Talks on phone: Not on file    Gets together: Not on file    Attends religious service: Not on file    Active member of club  or organization: Not on file    Attends meetings of clubs or organizations: Not on file    Relationship status: Not on file   Intimate partner violence:    Fear of current or ex partner: Not on file    Emotionally abused: Not on file    Physically abused: Not on file    Forced sexual activity: Not on file  Other Topics Concern   Not on file  Social History Narrative   Not on file    Past Surgical History:  Procedure Laterality Date   ABDOMINOPLASTY  [place]   right   BREAST LUMPECTOMY Right 01/27/2014   malignant   BREAST LUMPECTOMY WITH NEEDLE LOCALIZATION Right 01/27/2014   Procedure: BREAST LUMPECTOMY WITH NEEDLE LOCALIZATION;  Surgeon: Edward Jolly, MD;  Location: [facility];  Service: General;  Laterality: Right;   [place]   right        Physical Exam: Blood pressure 134/74, pulse 72, height 5\' 4"  (1.626 m), weight  87.2 kg, SpO2 99 %.    Affect appropriate Healthy:  appears stated age 39: normal Neck supple with no adenopathy JVP normal no bruits no thyromegaly Lungs clear with no wheezing and good diaphragmatic motion Heart:  S1/S2 no murmur, no rub, gallop or click PMI normal post right lumpectomy  Abdomen: benighn, BS positve, no tenderness, no AAA no bruit.  No HSM or HJR Distal pulses intact with no bruits No edema Neuro non-focal Skin warm and dry No muscular weakness   Labs:   Lab Results  Component Value Date   WBC 7.4 05/03/2017   HGB 12.8 05/03/2017   HCT 38.1 05/03/2017   MCV 95.2 05/03/2017   PLT 258 05/03/2017   No results for input(s): NA, K, CL, CO2, BUN, CREATININE, CALCIUM, PROT, BILITOT, ALKPHOS, ALT, AST, GLUCOSE in the last 168 hours.  Invalid input(s): LABALBU No results found for: CKTOTAL, CKMB, CKMBINDEX, TROPONINI No results found for: CHOL No results found for: HDL No results found for: LDLCALC No results found for: TRIG No results found for: CHOLHDL No results found for: LDLDIRECT    Radiology: Mm Diag Breast Tomo Bilateral  Result Date: 02/28/2019 CLINICAL DATA:  Malignant lumpectomy of the UPPER OUTER QUADRANT of the RIGHT breast in March, 2015, pathology invasive mammary carcinoma with papillary features.Patient currently undergoing hormonal chemoprevention with anastrozole. Annual evaluation. EXAM: DIGITAL DIAGNOSTIC BILATERAL MAMMOGRAM WITH CAD AND TOMO COMPARISON:  Previous exam(s). ACR Breast Density Category b: There are scattered areas of fibroglandular density. FINDINGS: Tomosynthesis and synthesized full field CC and MLO views of both breasts were obtained. Standard spot magnification MLO view of the lumpectomy site in the RIGHT breast was also obtained. Mild post surgical scar/architectural distortion at the lumpectomy site in the UPPER OUTER RIGHT breast at POSTERIOR depth. No new or suspicious findings in the RIGHT breast. No findings  suspicious for malignancy in the LEFT breast. Mammographic images were processed with CAD. IMPRESSION: 1. No mammographic evidence of malignancy involving either breast. 2. Expected post lumpectomy changes involving the RIGHT breast. RECOMMENDATION: As the patient is now 5 years out from her lumpectomy, she may return to annual screening. Screening mammogram in one year is recommended.(Code:SM-B-01Y) I have discussed the findings and recommendations with the patient. If applicable, a reminder letter will be sent to the patient regarding the  next appointment. BI-RADS CATEGORY  2: Benign. Electronically Signed   By: Evangeline Dakin M.D.   On: 02/28/2019 11:59    EKG: SR rate 56 normal 01/22/14  03/01/19 NSR rate 72 normal    ASSESSMENT AND PLAN:   1. Chest Pain:  Atypical given age and multiple risk factors should risk stratification Given COVID restrictions cardiac CT and lexiscan myouve are backed up . With radiation to back feel cardiac CT would be best Described procedure to patient willing to proceed when testing available  2. HTN:  Well controlled.  Continue current medications and low sodium Dash type diet.   3. HLD:  Continue statin LDL at goal LFTls normal 4. Thyroid:  Last TSH mildly suppressed f/u primary consider lowering dose to 100ug 5. Breast Cancer:  Post right lumpectomy stable on arimidex f/u oncology   Signed: Jenkins Rouge 03/01/2019, 3:41 PM

## 2019-03-01 ENCOUNTER — Ambulatory Visit: Payer: PPO | Admitting: Cardiovascular Disease

## 2019-03-01 ENCOUNTER — Encounter: Payer: Self-pay | Admitting: Cardiovascular Disease

## 2019-03-01 VITALS — BP 134/74 | HR 72 | Ht 64.0 in | Wt 192.2 lb

## 2019-03-01 DIAGNOSIS — R079 Chest pain, unspecified: Secondary | ICD-10-CM

## 2019-03-01 DIAGNOSIS — R0789 Other chest pain: Secondary | ICD-10-CM | POA: Diagnosis not present

## 2019-03-01 DIAGNOSIS — M549 Dorsalgia, unspecified: Secondary | ICD-10-CM

## 2019-03-01 MED ORDER — METOPROLOL TARTRATE 50 MG PO TABS: ORAL_TABLET | ORAL | 0 refills | Status: DC

## 2019-03-01 NOTE — Addendum Note (Signed)
Addended by: Aris Georgia, Latoy Labriola L on: 03/01/2019 04:41 PM   Modules accepted: Orders

## 2019-03-01 NOTE — Patient Instructions (Addendum)
Medication Instructions:   If you need a refill on your cardiac medications before your next appointment, please call your pharmacy.   Lab work:  If you have labs (blood work) drawn today and your tests are completely normal, you will receive your results only by: Marland Kitchen MyChart Message (if you have MyChart) OR . A paper copy in the mail If you have any lab test that is abnormal or we need to change your treatment, we will call you to review the results.  Testing/Procedures: Your physician has requested that you have cardiac CT in 8 to 10 weeks. Cardiac computed tomography (CT) is a painless test that uses an x-ray machine to take clear, detailed pictures of your heart. For further information please visit [facility]uge[facility]iesta[facility]tn. Please follow instruction sheet as given.  Follow-Up: At [facility], you and your health needs are our priority.  As part of our continuing mission to provide you with exceptional heart care, we have created designated Provider Care Teams.  These Care Teams include your primary Cardiologist (physician) and Advanced Practice Providers (APPs -  Physician Assistants and Nurse Practitioners) who all work together to provide you with the care you need, when you need it. You will need a follow up appointment in 3 months.  You may see Dr. Johnsie Cancel or one of the following Advanced Practice Providers on your designated Care Team:   Truitt Merle, NP Cecilie Kicks, NP . Kathyrn Drown, NP   Please arrive at the [facility] main entrance of [facility] at xx:xx AM (30-45 minutes prior to test start time)  [facility] [place], [place] [address] [phone]  Proceed to the [facility] Radiology Department (First Floor).  Please follow these instructions carefully (unless otherwise directed):  On the Night Before the Test: . Be sure to Drink plenty of water. . Do not consume any caffeinated/decaffeinated beverages or chocolate 12 hours prior  to your test. . Do not take any antihistamines 12 hours prior to your test. . Take metoprolol (Lopressor) the night before your test.  On the Day of the Test: . Drink plenty of water. Do not drink any water within one hour of the test. . Do not eat any food 4 hours prior to the test. . You may take your regular medications prior to the test.  . Take metoprolol (Lopressor) two hours prior to test.      After the Test: . Drink plenty of water. . After receiving IV contrast, you may experience a mild flushed feeling. This is normal. . On occasion, you may experience a mild rash up to 24 hours after the test. This is not dangerous. If this occurs, you can take Benadryl 25 mg and increase your fluid intake. . If you experience trouble breathing, this can be serious. If it is severe call 911 IMMEDIATELY. If it is mild, please call our office.

## 2019-03-05 ENCOUNTER — Other Ambulatory Visit: Payer: Self-pay | Admitting: Oncology

## 2019-03-05 DIAGNOSIS — C50411 Malignant neoplasm of upper-outer quadrant of right female breast: Secondary | ICD-10-CM

## 2019-03-05 DIAGNOSIS — Z85828 Personal history of other malignant neoplasm of skin: Secondary | ICD-10-CM | POA: Diagnosis not present

## 2019-03-05 DIAGNOSIS — C4442 Squamous cell carcinoma of skin of scalp and neck: Secondary | ICD-10-CM | POA: Diagnosis not present

## 2019-03-15 ENCOUNTER — Telehealth: Payer: Self-pay | Admitting: Cardiovascular Disease

## 2019-03-15 NOTE — Telephone Encounter (Signed)
Left message to call and schedule ct in 8 weeks with Dr. Johnsie Cancel to read.

## 2019-03-15 NOTE — Telephone Encounter (Signed)
Patient returned your call.

## 2019-03-18 ENCOUNTER — Other Ambulatory Visit: Payer: Self-pay

## 2019-03-18 DIAGNOSIS — R079 Chest pain, unspecified: Secondary | ICD-10-CM

## 2019-03-18 DIAGNOSIS — Z01812 Encounter for preprocedural laboratory examination: Secondary | ICD-10-CM

## 2019-03-18 NOTE — Progress Notes (Signed)
Patient needs BMET for CT test. Order placed.

## 2019-03-26 DIAGNOSIS — R7301 Impaired fasting glucose: Secondary | ICD-10-CM | POA: Diagnosis not present

## 2019-03-26 DIAGNOSIS — I10 Essential (primary) hypertension: Secondary | ICD-10-CM | POA: Diagnosis not present

## 2019-03-26 DIAGNOSIS — R6884 Jaw pain: Secondary | ICD-10-CM | POA: Diagnosis not present

## 2019-03-26 DIAGNOSIS — Z853 Personal history of malignant neoplasm of breast: Secondary | ICD-10-CM | POA: Diagnosis not present

## 2019-03-26 DIAGNOSIS — K760 Fatty (change of) liver, not elsewhere classified: Secondary | ICD-10-CM | POA: Diagnosis not present

## 2019-03-26 DIAGNOSIS — F338 Other recurrent depressive disorders: Secondary | ICD-10-CM | POA: Diagnosis not present

## 2019-03-26 DIAGNOSIS — E782 Mixed hyperlipidemia: Secondary | ICD-10-CM | POA: Diagnosis not present

## 2019-03-26 DIAGNOSIS — G4700 Insomnia, unspecified: Secondary | ICD-10-CM | POA: Diagnosis not present

## 2019-03-26 DIAGNOSIS — E039 Hypothyroidism, unspecified: Secondary | ICD-10-CM | POA: Diagnosis not present

## 2019-03-29 ENCOUNTER — Ambulatory Visit (HOSPITAL_COMMUNITY): Payer: PPO

## 2019-04-19 DIAGNOSIS — M25562 Pain in left knee: Secondary | ICD-10-CM | POA: Diagnosis not present

## 2019-04-23 ENCOUNTER — Other Ambulatory Visit: Payer: Self-pay

## 2019-04-23 ENCOUNTER — Other Ambulatory Visit: Payer: PPO

## 2019-04-23 DIAGNOSIS — R079 Chest pain, unspecified: Secondary | ICD-10-CM

## 2019-04-23 DIAGNOSIS — Z01812 Encounter for preprocedural laboratory examination: Secondary | ICD-10-CM

## 2019-04-23 DIAGNOSIS — R0789 Other chest pain: Secondary | ICD-10-CM | POA: Diagnosis not present

## 2019-04-23 LAB — BASIC METABOLIC PANEL
BUN/Creatinine Ratio: 15 (ref 12–28)
BUN: 12 mg/dL (ref 8–27)
CO2: 26 mmol/L (ref 20–29)
Calcium: 9.4 mg/dL (ref 8.7–10.3)
Chloride: 106 mmol/L (ref 96–106)
Creatinine, Ser: 0.78 mg/dL (ref 0.57–1.00)
GFR calc Af Amer: 84 mL/min/{1.73_m2} (ref >59)
GFR calc non Af Amer: 73 mL/min/{1.73_m2} (ref >59)
Glucose: 111 mg/dL — ABNORMAL HIGH (ref 65–99)
Potassium: 4.5 mmol/L (ref 3.5–5.2)
Sodium: 146 mmol/L — ABNORMAL HIGH (ref 134–144)

## 2019-04-24 DIAGNOSIS — M25562 Pain in left knee: Secondary | ICD-10-CM | POA: Diagnosis not present

## 2019-04-26 ENCOUNTER — Telehealth (HOSPITAL_COMMUNITY): Payer: Self-pay | Admitting: Emergency Medicine

## 2019-04-26 NOTE — Telephone Encounter (Signed)
Reaching out to patient to offer assistance regarding upcoming cardiac imaging study; pt verbalizes understanding of appt date/time, parking situation and where to check in, pre-test NPO status and medications ordered, and verified current allergies; name and call back number provided for further questions should they arise Lalana Wachter RN Navigator Cardiac Imaging [facility] [phone] office [phone] cell  Pt denies covid symptoms, verbalized understanding of visitor policy. 

## 2019-04-29 ENCOUNTER — Ambulatory Visit (HOSPITAL_COMMUNITY)
Admission: RE | Admit: 2019-04-29 | Discharge: 2019-04-29 | Disposition: A | Discharge status: Home / Self Care | Payer: PPO | Source: Ambulatory Visit | Attending: Cardiovascular Disease | Admitting: Cardiovascular Disease

## 2019-04-29 ENCOUNTER — Other Ambulatory Visit: Payer: Self-pay

## 2019-04-29 ENCOUNTER — Ambulatory Visit (HOSPITAL_COMMUNITY): Payer: PPO

## 2019-04-29 DIAGNOSIS — Z86718 Personal history of other venous thrombosis and embolism: Secondary | ICD-10-CM | POA: Insufficient documentation

## 2019-04-29 DIAGNOSIS — R079 Chest pain, unspecified: Secondary | ICD-10-CM

## 2019-04-29 DIAGNOSIS — C50919 Malignant neoplasm of unspecified site of unspecified female breast: Secondary | ICD-10-CM | POA: Insufficient documentation

## 2019-04-29 DIAGNOSIS — M549 Dorsalgia, unspecified: Secondary | ICD-10-CM | POA: Diagnosis not present

## 2019-04-29 DIAGNOSIS — E039 Hypothyroidism, unspecified: Secondary | ICD-10-CM | POA: Diagnosis not present

## 2019-04-29 DIAGNOSIS — Z87891 Personal history of nicotine dependence: Secondary | ICD-10-CM | POA: Diagnosis not present

## 2019-04-29 DIAGNOSIS — I2510 Atherosclerotic heart disease of native coronary artery without angina pectoris: Secondary | ICD-10-CM | POA: Insufficient documentation

## 2019-04-29 DIAGNOSIS — Z86711 Personal history of pulmonary embolism: Secondary | ICD-10-CM | POA: Diagnosis not present

## 2019-04-29 DIAGNOSIS — R0789 Other chest pain: Secondary | ICD-10-CM | POA: Diagnosis not present

## 2019-04-29 DIAGNOSIS — Z006 Encounter for examination for normal comparison and control in clinical research program: Secondary | ICD-10-CM

## 2019-04-29 DIAGNOSIS — E785 Hyperlipidemia, unspecified: Secondary | ICD-10-CM | POA: Insufficient documentation

## 2019-04-29 MED ORDER — NITROGLYCERIN 0.4 MG SL SUBL
0.8000 mg | SUBLINGUAL_TABLET | Freq: Once | SUBLINGUAL | Status: AC
  Administered 2019-04-29: 12:00:00 0.8 mg via SUBLINGUAL
  Filled 2019-04-29: qty 25

## 2019-04-29 MED ORDER — NITROGLYCERIN 0.4 MG SL SUBL
SUBLINGUAL_TABLET | SUBLINGUAL | Status: AC
  Filled 2019-04-29: qty 2

## 2019-04-29 MED ORDER — IOHEXOL 350 MG/ML SOLN
100.0000 mL | Freq: Once | INTRAVENOUS | Status: AC | PRN
  Administered 2019-04-29: 80 mL via INTRAVENOUS

## 2019-04-29 NOTE — Research (Signed)
[facility] met inclusion and exclusion criteria.  The informed consent form, study requirements and expectations were reviewed with the subject and questions and concerns were addressed prior to the signing of the consent form.  The subject verbalized understanding of the trial requirements.  The subject agreed to participate in the CADFEM trial and signed the informed consent.  The informed consent was obtained prior to performance of any protocol-specific procedures for the subject.  A copy of the signed informed consent was given to the subject and a copy was placed in the subject's medical record.   [name]. [employer]

## 2019-04-29 NOTE — Progress Notes (Signed)
PT ambulatory out of department with steady gait. Denies any complaints.

## 2019-04-29 NOTE — Progress Notes (Signed)
[facility] met inclusion and exclusion criteria.  The informed consent form, study requirements and expectations were reviewed with the subject and questions and concerns were addressed prior to the signing of the consent form.  The subject verbalized understanding of the trial requirements.  The subject agreed to participate in the CADFEM trial and signed the informed consent.  The informed consent was obtained prior to performance of any protocol-specific procedures for the subject.  A copy of the signed informed consent was given to the subject and a copy was placed in the subject's medical record.   [name]. [employer]

## 2019-04-29 NOTE — Progress Notes (Signed)
CT complete. Patient denies any complaints. Offered pt snack and beverage.

## 2019-05-01 ENCOUNTER — Telehealth: Payer: Self-pay | Admitting: Oncology

## 2019-05-01 NOTE — Telephone Encounter (Signed)
Confirmed appt and verified info. °

## 2019-05-01 NOTE — Telephone Encounter (Signed)
Called patient regarding upcoming Webex appointment, per patient's request this will be a telephone visit.  °

## 2019-05-02 ENCOUNTER — Inpatient Hospital Stay: Payer: PPO | Attending: Oncology | Admitting: Oncology

## 2019-05-02 ENCOUNTER — Other Ambulatory Visit: Payer: PPO

## 2019-05-02 DIAGNOSIS — Z87891 Personal history of nicotine dependence: Secondary | ICD-10-CM | POA: Diagnosis not present

## 2019-05-02 DIAGNOSIS — Z79811 Long term (current) use of aromatase inhibitors: Secondary | ICD-10-CM | POA: Diagnosis not present

## 2019-05-02 DIAGNOSIS — Z170 Estrogen receptor positive status [ER+]: Secondary | ICD-10-CM | POA: Diagnosis not present

## 2019-05-02 DIAGNOSIS — C50411 Malignant neoplasm of upper-outer quadrant of right female breast: Secondary | ICD-10-CM | POA: Diagnosis not present

## 2019-05-02 MED ORDER — CITALOPRAM HYDROBROMIDE 20 MG PO TABS: 20.0000 mg | ORAL_TABLET | Freq: Every day | ORAL | 4 refills | Status: DC

## 2019-05-02 NOTE — Progress Notes (Signed)
[place]  [phone] Fax:([phone]     ID: Jessica Bautista OB: 26-Jan-1941  MR#: [id number]  CZY#:[id number]  Patient Care Team: Hulan Fess, MD as PCP - General (Family Medicine) Excell Seltzer, MD as Consulting Physician (General Surgery) Eppie Gibson, MD as Attending Physician (Radiation Oncology) Nazirah Tri, Virgie Dad, MD as Consulting Physician (Oncology) OTHER MD:   I connected with Jessica Bautista on 05/02/19 at  3:30 PM EDT by telephone visit and verified that I am speaking with the correct person using two identifiers.   I discussed the limitations, risks, security and privacy concerns of performing an evaluation and management service by telemedicine and the availability of in-person appointments. I also discussed with the patient that there may be a patient responsible charge related to this service. The patient expressed understanding and agreed to proceed.   Other persons participating in the visit and their role in the encounter: Jessica Bautista, scribe   Patient's location: home  Provider's location: [place]: Early stage estrogen receptor positive breast cancer  CURRENT TREATMENT: Anastrozole   INTERVAL HISTORY: Jessica Bautista returns today for follow-up of her estrogen receptor positive breast cancer.   She continues on anastrozole, with good tolerance. She denies hot flashes, vaginal dryness or discharge. She is able to obtain it at a reasonable price.   Since her last visit, she underwent bilateral diagnostic mammography with tomography at [facility] on 02/28/2019 showing: breast density category B; no evidence of malignancy in either breast.  Her most recent bone density was performed on 01/04/2018 and showed a T-score of -1.1.   REVIEW OF SYSTEMS: Jessica Bautista reports she has been unable to do any exercise classes because of pandemic concerns. Her husband passed away since we saw her last. He  was 39 and had dementia; she was his caregiver and took very good care of him. She lives by herself now. A detailed review of systems was otherwise entirely negative.   BREAST CANCER HISTORY: From the original intake note:  Jessica Bautista had routine bilateral screening mammography at the breast Center to 01/24/2014. A possible mass in the right breast was noted, and she was recalled for a right diagnostic mammogram and ultrasound 01-2014. This showed an irregular 8 mm mass in the lateral right breast, with a second more circumscribed 7 mm mass inferiorly, which was stable. There was no palpable mass by exam. Ultrasound showed a 7 mm irregular hypoechoic mass in the right breast upper outer quadrant. The second area noted on mammography was a simple cyst. There was no right axillary adenopathy.  Biopsy of the questionable mass 01/08/2014 showed ([id number]) an invasive ductal carcinoma with papillary features, grade 2estrogen and progesterone receptor positive, with an MIB-1 of 15 and no HER-2 amplification, the signals ratio being 1.19 in the number per cell 1.55.   The patient's subsequent history is as detailed below   PAST MEDICAL HISTORY: Past Medical History:  Diagnosis Date  . Atypical nevi   . Basal cell carcinoma   . Breast cancer ([place])   . DVT (deep venous thrombosis) ([place])   . Eczema   . Family history of colonic polyps   . Fatty (change of) liver, not elsewhere classified   . Hyperlipidemia   . Hypertension   . Hypothyroidism   . Impaired fasting glucose   . Insomnia   . Lichen sclerosus et atrophicus of the vulva   . Major depression, single episode   .  Nontoxic single thyroid nodule   . Osteopenia   . Psoriasis   . Pulmonary embolus ([place])    post op tummy tuc  . Skin cancer   . Thyroid disease   . Vitamin D deficiency, unspecified   . Wears glasses     PAST SURGICAL HISTORY: Past Surgical History:  Procedure Laterality Date  . ABDOMINOPLASTY  1998  . [place]   right  . BREAST LUMPECTOMY Right 01/27/2014   malignant  . BREAST LUMPECTOMY WITH NEEDLE LOCALIZATION Right 01/27/2014   Procedure: BREAST LUMPECTOMY WITH NEEDLE LOCALIZATION;  Surgeon: Edward Jolly, MD;  Location: [place] [place];  Service: General;  Laterality: Right;  . BREAST SURGERY    . COLONOSCOPY    . TUBAL LIGATION    . WRIST FRACTURE SURGERY  1975   right    FAMILY HISTORY Family History  Problem Relation Age of Onset  . Alzheimer's disease Mother   . Colon polyps Mother   . Heart attack Father 23  . Cancer Brother        LUNG- SMOKER   . Healthy Sister   . Heart attack Brother   . Dementia Brother   . Healthy Sister    The patient's father died at the age of 19 with multiple comorbidities including diabetes heart disease and COPD. The patient's mother died at the age of 52. The patient had 3 brothers, one of whom died from lung cancer at the age of 43. He was a smoker. She had 2 sisters. There is no history of breast or ovarian cancer in the family.   GYNECOLOGIC HISTORY:  Menarche age 63, first live birth age 54, [name] P5. She stopped having periods "about 20 years ago". She did not take hormone replacement. 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 Her husband Jessica Bautista passed away at 35 years old. Son Jessica Bautista lives in [place] and is in pacemaker sales. Daughter Jessica Bautista lives in [place] where she works for a Herbalist areas. Daughter Jessica Bautista heritage lives in [place] and is an Glass blower/designer. Son Jessica Bautista lives in [place] and is a Probation officer. Son Jessica Bautista also lives in [place], and works in plumbing. The patient has multiple grandchildren. She attends Jessica Bautista    ADVANCED DIRECTIVES: In place   HEALTH MAINTENANCE: Social History   Tobacco Use  . Smoking status: Former Smoker     Packs/day: 1.00    Types: Cigarettes    Quit date: 01/23/1983    Years since quitting: 36.2  . Smokeless tobacco: Never Used  Substance Use Topics  . Alcohol use: Yes    Alcohol/week: 3.0 standard drinks    Types: 3 Glasses of wine per week  . Drug use: No     Colonoscopy:  PAP:  Bone density: 01/04/2018, t-score of -1.1   Lipid panel:  Allergies  Allergen Reactions  . Tape Itching and Rash    Adhesive Tape  . Hrt Support [A-G Pro]     History of pe and dvt    Current Outpatient Medications  Medication Sig Dispense Refill  . b complex vitamins capsule Take 1 capsule by mouth daily.    . Calcium Citrate-Vitamin D (CALCIUM + D PO) Take by mouth.    . Cholecalciferol (VITAMIN D-3 PO) Take 2,000 Units by mouth  daily.    . citalopram (CELEXA) 20 MG tablet Take 1 tablet (20 mg total) by mouth daily. 90 tablet 4  . clobetasol ointment (TEMOVATE) 0.05 % Applied three times a week 30 g 5  . levothyroxine (SYNTHROID) 88 MCG tablet Take 88 mcg by mouth daily.    . metoprolol tartrate (LOPRESSOR) 50 MG tablet Take one tablet by mouth the night before your CT and take one tablet by mouth the morning of your CT test 2 tablet 0  . Red Yeast Rice Extract (RED YEAST RICE PO) Take by mouth.    . rosuvastatin (CRESTOR) 5 MG tablet Take 5 mg by mouth daily.    Marland Kitchen zolpidem (AMBIEN) 10 MG tablet Take by mouth as needed.  0   No current facility-administered medications for this visit.     OBJECTIVE: Middle-aged white woman in no acute distress  There were no vitals filed for this visit.   There is no height or weight on file to calculate BMI.    ECOG FS:0 - Asymptomatic    LAB RESULTS:  CMP     Component Value Date/Time   NA 146 (H) 04/23/2019 1029   NA 143 05/03/2017 0950   K 4.5 04/23/2019 1029   K 4.7 05/03/2017 0950   CL 106 04/23/2019 1029   CO2 26 04/23/2019 1029   CO2 29 05/03/2017 0950   GLUCOSE 111 (H) 04/23/2019 1029   GLUCOSE 109 05/03/2017 0950   BUN 12 04/23/2019  1029   BUN 12.0 05/03/2017 0950   CREATININE 0.78 04/23/2019 1029   CREATININE 0.8 05/03/2017 0950   CALCIUM 9.4 04/23/2019 1029   CALCIUM 9.8 05/03/2017 0950   PROT 7.1 05/03/2017 0950   ALBUMIN 3.8 05/03/2017 0950   AST 18 05/03/2017 0950   ALT 15 05/03/2017 0950   ALKPHOS 72 05/03/2017 0950   BILITOT 0.71 05/03/2017 0950   GFRNONAA 73 04/23/2019 1029   GFRAA 84 04/23/2019 1029    I No results found for: SPEP  Lab Results  Component Value Date   WBC 7.4 05/03/2017   NEUTROABS 4.9 05/03/2017   HGB 12.8 05/03/2017   HCT 38.1 05/03/2017   MCV 95.2 05/03/2017   PLT 258 05/03/2017      Chemistry      Component Value Date/Time   NA 146 (H) 04/23/2019 1029   NA 143 05/03/2017 0950   K 4.5 04/23/2019 1029   K 4.7 05/03/2017 0950   CL 106 04/23/2019 1029   CO2 26 04/23/2019 1029   CO2 29 05/03/2017 0950   BUN 12 04/23/2019 1029   BUN 12.0 05/03/2017 0950   CREATININE 0.78 04/23/2019 1029   CREATININE 0.8 05/03/2017 0950      Component Value Date/Time   CALCIUM 9.4 04/23/2019 1029   CALCIUM 9.8 05/03/2017 0950   ALKPHOS 72 05/03/2017 0950   AST 18 05/03/2017 0950   ALT 15 05/03/2017 0950   BILITOT 0.71 05/03/2017 0950       No results found for: LABCA2  No components found for: [id number]  No results for input(s): INR in the last 168 hours.  Urinalysis No results found for: COLORURINE  STUDIES: Ct Coronary Morph W/cta Cor W/score W/ca W/cm &/or Wo/cm  Addendum Date: 04/29/2019   ADDENDUM REPORT: 04/29/2019 15:56 EXAM: OVER-READ INTERPRETATION  CT CHEST The following report is an over-read performed by radiologist Dr. Alvino Blood [facility] on 04/29/2019. This over-read does not include interpretation of cardiac or coronary anatomy or pathology. The coronary  CTA interpretation by the cardiologist is attached. COMPARISON:  None. FINDINGS: Limited view of the lung parenchyma demonstrates no suspicious nodularity. Airways are normal. Limited  view of the mediastinum demonstrates no adenopathy. Esophagus normal. Limited view of the upper abdomen unremarkable. Limited view of the skeleton and chest wall is unremarkable. IMPRESSION: No significant extracardiac findings. Electronically Signed   By: Suzy Bouchard M.D.   On: 04/29/2019 15:56   Result Date: 04/29/2019 CLINICAL DATA:  Chest pain EXAM: Cardiac CTA MEDICATIONS: Sub lingual nitro. '4mg'$  and lopressor '50mg'$  TECHNIQUE: The patient was scanned on a Siemens Force 160 slice scanner. Gantry rotation speed was 250 msecs. Collimation was .6 mm. A 100 kV prospective scan was triggered in the ascending thoracic aorta at 140 HU's Full mA was used between 35% and 75% of the R-R interval. Average HR during the scan was 47 bpm. The 3D data set was interpreted on a dedicated work station using MPR, MIP and VRT modes. A total of 80cc of contrast was used. FINDINGS: Non-cardiac: See separate report from [facility]. No significant findings on limited lung and soft tissue windows. Calcium Score: Isolated calcium in mid LAD Coronary Arteries: Right dominant with no anomalies LM: Normal LAD: 1-24% calcified plaque in mid LAD D1: Normal D2: Normal Circumflex: Normal OM1: Normal OM2: Normal RCA: Normal PDA: Normal PLA: Normal IMPRESSION: 1. Calcium score 6 isolated to mid LAD this is only 21 st percentile for age and sex 2.  Normal aortic root 3.4 cm 3.  CAD RADS 1 mild disease in mid LAD No obstructive CAD Jenkins Rouge Electronically Signed: By: Jenkins Rouge M.D. On: 04/29/2019 13:48    ASSESSMENT: 78 y.o. [place] woman status post right breast biopsy 01/08/2014 for a clinical T1b N0, stage IA invasive ductal carcinoma, grade 2, estrogen and progesterone receptor positive, with an MIB-1 of 15% and no HER-2 amplification  (1) s/p Right lumpectomy 01/27/2014 for a pT1c NX, stage I invasive lobular carcinoma, HER-2 again negative, margins close but negative  (2) the consensus at the multidisciplinary  conference 02/12/2014 was that radiation was optional; the patient discussed this with her radiation oncologist and opted against radiation  (3) anastrozole started April 2015;   (a) bone density scan on 12/29/15 showed osteopenia with a t-score of -1.3  (b) DEXA scan on 01/04/2018 shows a T score of -1.1  PLAN: Kellen is now over 5 years out from definitive surgery for her breast cancer with no evidence of disease recurrence.  This is very febrile.  She has completed 5 years on anastrozole.  In some cases we recommend a total of 7 years, which is the maximum documented to be helpful, but in her case, with an excellent prognosis, the benefit from additional 2 years might be in the 1% range and certainly not worth the effort or the possibility of worsening osteopenia  Accordingly she is going off anastrozole at this point.  She requested I refill her citalopram which I was glad to do for another year, after which she will discuss that with her primary care physician.  At this point I am comfortable releasing her to her primary care physicians care.  All she will need is a yearly mammography preferably with tomography and a yearly physician breast exam.  I will be glad to see her again at any point in the future if and when the need arises but we are not making any further routine appointments for her here   Yaw Escoto, Virgie Dad, MD  05/02/19 3:41 PM Medical Oncology and Hematology [facility] [address] [place],  [address] Tel. [phone]    Fax. [phone]   I, Jessica Bautista, am acting as scribe for Dr. Virgie Dad. Mattilynn Forrer.  I, Lurline Del MD, have reviewed the above documentation for accuracy and completeness, and I agree with the above.

## 2019-05-08 DIAGNOSIS — M25562 Pain in left knee: Secondary | ICD-10-CM | POA: Diagnosis not present

## 2019-05-08 DIAGNOSIS — M1712 Unilateral primary osteoarthritis, left knee: Secondary | ICD-10-CM | POA: Diagnosis not present

## 2019-05-08 DIAGNOSIS — M170 Bilateral primary osteoarthritis of knee: Secondary | ICD-10-CM | POA: Diagnosis not present

## 2019-05-08 DIAGNOSIS — M1711 Unilateral primary osteoarthritis, right knee: Secondary | ICD-10-CM | POA: Diagnosis not present

## 2019-05-09 DIAGNOSIS — S30866A Insect bite (nonvenomous) of unspecified external genital organs, female, initial encounter: Secondary | ICD-10-CM | POA: Diagnosis not present

## 2019-05-09 DIAGNOSIS — W57XXXA Bitten or stung by nonvenomous insect and other nonvenomous arthropods, initial encounter: Secondary | ICD-10-CM | POA: Diagnosis not present

## 2019-05-09 DIAGNOSIS — L299 Pruritus, unspecified: Secondary | ICD-10-CM | POA: Diagnosis not present

## 2019-05-15 DIAGNOSIS — M25562 Pain in left knee: Secondary | ICD-10-CM | POA: Diagnosis not present

## 2019-05-20 DIAGNOSIS — M25562 Pain in left knee: Secondary | ICD-10-CM | POA: Diagnosis not present

## 2019-05-22 ENCOUNTER — Telehealth: Payer: Self-pay | Admitting: Nurse Practitioner

## 2019-05-22 DIAGNOSIS — M25562 Pain in left knee: Secondary | ICD-10-CM | POA: Diagnosis not present

## 2019-05-22 NOTE — Telephone Encounter (Signed)
Left message for patient to call back regarding her appointment with Dr. Johnsie Cancel on 7/30. I advised that he has a virtual clinic on this day and to call back to reschedule to a clinic day or to let us know that she agrees with virtual visit.

## 2019-05-28 ENCOUNTER — Other Ambulatory Visit: Payer: Self-pay | Admitting: Oncology

## 2019-05-28 DIAGNOSIS — C50411 Malignant neoplasm of upper-outer quadrant of right female breast: Secondary | ICD-10-CM

## 2019-05-31 DIAGNOSIS — M25562 Pain in left knee: Secondary | ICD-10-CM | POA: Diagnosis not present

## 2019-06-04 DIAGNOSIS — M25562 Pain in left knee: Secondary | ICD-10-CM | POA: Diagnosis not present

## 2019-06-04 NOTE — Progress Notes (Signed)
CARDIOLOGY CONSULT NOTE       Patient ID: Jessica Bautista MRN: [id number] DOB/AGE: 1941-04-25 78 y.o.  Admit date: (Not on file) Referring Physician: Hulan Fess Primary Physician: Hulan Fess, MD Primary Cardiologist: New/Shaden Higley Reason for Consultation: Chest pain    HPI:  78 y.o. referred by Dr Rex Kras April 2020 for chest pain Reviewed his office note from 02/14/19 History of glucose intolerance, HTN and HLD. She also has thyroid disease and previous breast cancer post right lumpectomy 2015 Can get atypical pain between shoulder blades that radiates to jaw when she walks her dog Pain is still atypical no associated diaphoresis , presyncope or palpitations   Lab review from 02/07/19 normal Hct 38.1 PLT 271 Cr .74 K 5.0 TSH a bit suppressed at .18 LDL 90   Had f/u cardiac CTA done 02/27/19 calcium score only 6 non obstructive dx in mid LAD CAD-RADS 1  Knee problems seeing Dr Tonita Cong to get injections   ROS All other systems reviewed and negative except as noted above  Past Medical History:  Diagnosis Date  . Atypical nevi   . Basal cell carcinoma   . Breast cancer ([place])   . DVT (deep venous thrombosis) ([place])   . Eczema   . Family history of colonic polyps   . Fatty (change of) liver, not elsewhere classified   . Hyperlipidemia   . Hypertension   . Hypothyroidism   . Impaired fasting glucose   . Insomnia   . Lichen sclerosus et atrophicus of the vulva   . Major depression, single episode   . Nontoxic single thyroid nodule   . Osteopenia   . Psoriasis   . Pulmonary embolus ([place])    post op tummy tuc  . Skin cancer   . Thyroid disease   . Vitamin D deficiency, unspecified   . Wears glasses     Family History  Problem Relation Age of Onset  . Alzheimer's disease Mother   . Colon polyps Mother   . Heart attack Father 32  . Cancer Brother        LUNG- SMOKER   . Healthy Sister   . Heart attack Brother   . Dementia Brother   . Healthy Sister     Social History   Socioeconomic History  . Marital status: Married    Spouse name: Not on file  . Number of children: Not on file  . Years of education: Not on file  . Highest education level: Not on file  Occupational History  . Not on file  Social Needs  . Financial resource strain: Not on file  . Food insecurity    Worry: Not on file    Inability: Not on file  . Transportation needs    Medical: Not on file    Non-medical: Not on file  Tobacco Use  . Smoking status: Former Smoker    Packs/day: 1.00    Types: Cigarettes    Quit date: 01/23/1983    Years since quitting: 36.3  . Smokeless tobacco: Never Used  Substance and Sexual Activity  . Alcohol use: Yes    Alcohol/week: 3.0 standard drinks    Types: 3 Glasses of wine per week  . Drug use: No  . Sexual activity: Never  Lifestyle  . Physical activity    Days per week: Not on file    Minutes per session: Not on file  . Stress: Not on file  Relationships  . Social Herbalist on  phone: Not on file    Gets together: Not on file    Attends religious service: Not on file    Active member of club or organization: Not on file    Attends meetings of clubs or organizations: Not on file    Relationship status: Not on file  . Intimate partner violence    Fear of current or ex partner: Not on file    Emotionally abused: Not on file    Physically abused: Not on file    Forced sexual activity: Not on file  Other Topics Concern  . Not on file  Social History Narrative  . Not on file    Past Surgical History:  Procedure Laterality Date  . ABDOMINOPLASTY  1998  . [place]   right  . BREAST LUMPECTOMY Right 01/27/2014   malignant  . BREAST LUMPECTOMY WITH NEEDLE LOCALIZATION Right 01/27/2014   Procedure: BREAST LUMPECTOMY WITH NEEDLE LOCALIZATION;  Surgeon: Edward Jolly, MD;  Location: [facility];  Service: General;  Laterality: Right;  . BREAST SURGERY    . COLONOSCOPY    . TUBAL  LIGATION    . WRIST FRACTURE SURGERY  1975   right        Physical Exam: Blood pressure 128/70, pulse 65, height 5\' 4"  (1.626 m), weight 196 lb (88.9 kg), SpO2 96 %.    Affect appropriate Healthy:  appears stated age 48: normal Neck supple with no adenopathy JVP normal no bruits no thyromegaly Lungs clear with no wheezing and good diaphragmatic motion Heart:  S1/S2 no murmur, no rub, gallop or click PMI normal post right lumpectomy  Abdomen: benighn, BS positve, no tenderness, no AAA no bruit.  No HSM or HJR Distal pulses intact with no bruits No edema Neuro non-focal Skin warm and dry No muscular weakness   Labs:   Lab Results  Component Value Date   WBC 7.4 05/03/2017   HGB 12.8 05/03/2017   HCT 38.1 05/03/2017   MCV 95.2 05/03/2017   PLT 258 05/03/2017   No results for input(s): NA, K, CL, CO2, BUN, CREATININE, CALCIUM, PROT, BILITOT, ALKPHOS, ALT, AST, GLUCOSE in the last 168 hours.  Invalid input(s): LABALBU No results found for: CKTOTAL, CKMB, CKMBINDEX, TROPONINI No results found for: CHOL No results found for: HDL No results found for: LDLCALC No results found for: TRIG No results found for: CHOLHDL No results found for: LDLDIRECT    Radiology: No results found.  EKG: SR rate 56 normal 01/22/14  03/01/19 NSR rate 72 normal    ASSESSMENT AND PLAN:   1. Chest Pain:  Atypical  Cardiac CTA 04/29/19 no obstructive dx  2. HTN:  Well controlled.  Continue current medications and low sodium Dash type diet.   3. HLD:  Continue statin LDL at goal LFTls normal 4. Thyroid:  Last TSH mildly suppressed f/u primary consider lowering dose to 88 ug 5. Breast Cancer:  Post right lumpectomy stable arimidex stopped a month ago completed 5 years   Signed: Jenkins Rouge 06/06/2019, 9:12 AM

## 2019-06-05 ENCOUNTER — Telehealth: Payer: Self-pay | Admitting: *Deleted

## 2019-06-05 NOTE — Telephone Encounter (Signed)
Pt has answered NO to all prescreen questions below:        COVID-19 Pre-Screening Questions:  . In the past 7 to 10 days have you had a cough,  shortness of breath, headache, congestion, fever (100 or greater) body aches, chills, sore throat, or sudden loss of taste or sense of smell? . Have you been around anyone with known Covid 19. . Have you been around anyone who is awaiting Covid 19 test results in the past 7 to 10 days? . Have you been around anyone who has been exposed to Covid 19, or has mentioned symptoms of Covid 19 within the past 7 to 10 days?  If you have any concerns/questions about symptoms patients report during screening (either on the phone or at threshold). Contact the provider seeing the patient or DOD for further guidance.  If neither are available contact a member of the leadership team.

## 2019-06-06 ENCOUNTER — Encounter: Payer: Self-pay | Admitting: Cardiovascular Disease

## 2019-06-06 ENCOUNTER — Ambulatory Visit (INDEPENDENT_AMBULATORY_CARE_PROVIDER_SITE_OTHER): Payer: PPO | Admitting: Cardiovascular Disease

## 2019-06-06 ENCOUNTER — Other Ambulatory Visit: Payer: Self-pay

## 2019-06-06 VITALS — BP 128/70 | HR 65 | Ht 64.0 in | Wt 196.0 lb

## 2019-06-06 DIAGNOSIS — R072 Precordial pain: Secondary | ICD-10-CM | POA: Diagnosis not present

## 2019-06-06 NOTE — Patient Instructions (Signed)
Medication Instructions:  Your physician recommends that you continue on your current medications as directed. Please refer to the Current Medication list given to you today.  If you need a refill on your cardiac medications before your next appointment, please call your pharmacy.    Lab work: None Ordered   Testing/Procedures: None Ordered   Follow-Up: At [facility], you and your health needs are our priority.  As part of our continuing mission to provide you with exceptional heart care, we have created designated Provider Care Teams.  These Care Teams include your primary Cardiologist (physician) and Advanced Practice Providers (APPs -  Physician Assistants and Nurse Practitioners) who all work together to provide you with the care you need, when you need it. You will need a follow up appointment in  AS Needed.  Please call our office 2 months in advance to schedule this appointment.  You may see Dr. Johnsie Cancel or one of the following Advanced Practice Providers on your designated Care Team:   Truitt Merle, NP Cecilie Kicks, NP . Kathyrn Drown, NP

## 2019-06-18 DIAGNOSIS — M1712 Unilateral primary osteoarthritis, left knee: Secondary | ICD-10-CM | POA: Diagnosis not present

## 2019-06-25 DIAGNOSIS — M1712 Unilateral primary osteoarthritis, left knee: Secondary | ICD-10-CM | POA: Diagnosis not present

## 2019-07-02 DIAGNOSIS — M1712 Unilateral primary osteoarthritis, left knee: Secondary | ICD-10-CM | POA: Diagnosis not present

## 2019-07-23 DIAGNOSIS — C50919 Malignant neoplasm of unspecified site of unspecified female breast: Secondary | ICD-10-CM | POA: Diagnosis not present

## 2019-07-23 DIAGNOSIS — E782 Mixed hyperlipidemia: Secondary | ICD-10-CM | POA: Diagnosis not present

## 2019-07-23 DIAGNOSIS — I10 Essential (primary) hypertension: Secondary | ICD-10-CM | POA: Diagnosis not present

## 2019-07-23 DIAGNOSIS — Z853 Personal history of malignant neoplasm of breast: Secondary | ICD-10-CM | POA: Diagnosis not present

## 2019-07-23 DIAGNOSIS — F329 Major depressive disorder, single episode, unspecified: Secondary | ICD-10-CM | POA: Diagnosis not present

## 2019-07-23 DIAGNOSIS — E039 Hypothyroidism, unspecified: Secondary | ICD-10-CM | POA: Diagnosis not present

## 2019-10-17 ENCOUNTER — Other Ambulatory Visit: Payer: Self-pay

## 2019-10-17 DIAGNOSIS — Z20822 Contact with and (suspected) exposure to covid-19: Secondary | ICD-10-CM

## 2019-10-19 LAB — NOVEL CORONAVIRUS, NAA: SARS-CoV-2, NAA: NOT DETECTED

## 2019-10-28 DIAGNOSIS — Z03818 Encounter for observation for suspected exposure to other biological agents ruled out: Secondary | ICD-10-CM | POA: Diagnosis not present

## 2019-11-07 DIAGNOSIS — C50919 Malignant neoplasm of unspecified site of unspecified female breast: Secondary | ICD-10-CM | POA: Diagnosis not present

## 2019-11-07 DIAGNOSIS — E039 Hypothyroidism, unspecified: Secondary | ICD-10-CM | POA: Diagnosis not present

## 2019-11-07 DIAGNOSIS — I10 Essential (primary) hypertension: Secondary | ICD-10-CM | POA: Diagnosis not present

## 2019-11-07 DIAGNOSIS — F329 Major depressive disorder, single episode, unspecified: Secondary | ICD-10-CM | POA: Diagnosis not present

## 2019-11-07 DIAGNOSIS — Z853 Personal history of malignant neoplasm of breast: Secondary | ICD-10-CM | POA: Diagnosis not present

## 2019-11-07 DIAGNOSIS — E782 Mixed hyperlipidemia: Secondary | ICD-10-CM | POA: Diagnosis not present

## 2019-11-29 DIAGNOSIS — Z20822 Contact with and (suspected) exposure to covid-19: Secondary | ICD-10-CM | POA: Diagnosis not present

## 2019-12-31 DIAGNOSIS — I10 Essential (primary) hypertension: Secondary | ICD-10-CM | POA: Diagnosis not present

## 2019-12-31 DIAGNOSIS — Z853 Personal history of malignant neoplasm of breast: Secondary | ICD-10-CM | POA: Diagnosis not present

## 2019-12-31 DIAGNOSIS — C50919 Malignant neoplasm of unspecified site of unspecified female breast: Secondary | ICD-10-CM | POA: Diagnosis not present

## 2019-12-31 DIAGNOSIS — E039 Hypothyroidism, unspecified: Secondary | ICD-10-CM | POA: Diagnosis not present

## 2019-12-31 DIAGNOSIS — E782 Mixed hyperlipidemia: Secondary | ICD-10-CM | POA: Diagnosis not present

## 2019-12-31 DIAGNOSIS — F329 Major depressive disorder, single episode, unspecified: Secondary | ICD-10-CM | POA: Diagnosis not present

## 2020-01-27 DIAGNOSIS — E039 Hypothyroidism, unspecified: Secondary | ICD-10-CM | POA: Diagnosis not present

## 2020-01-27 DIAGNOSIS — E782 Mixed hyperlipidemia: Secondary | ICD-10-CM | POA: Diagnosis not present

## 2020-01-27 DIAGNOSIS — Z853 Personal history of malignant neoplasm of breast: Secondary | ICD-10-CM | POA: Diagnosis not present

## 2020-01-27 DIAGNOSIS — I10 Essential (primary) hypertension: Secondary | ICD-10-CM | POA: Diagnosis not present

## 2020-01-27 DIAGNOSIS — C50919 Malignant neoplasm of unspecified site of unspecified female breast: Secondary | ICD-10-CM | POA: Diagnosis not present

## 2020-01-27 DIAGNOSIS — F329 Major depressive disorder, single episode, unspecified: Secondary | ICD-10-CM | POA: Diagnosis not present

## 2020-01-27 DIAGNOSIS — G4700 Insomnia, unspecified: Secondary | ICD-10-CM | POA: Diagnosis not present

## 2020-02-12 ENCOUNTER — Other Ambulatory Visit: Payer: Self-pay | Admitting: Family Medicine

## 2020-02-12 DIAGNOSIS — D2261 Melanocytic nevi of right upper limb, including shoulder: Secondary | ICD-10-CM | POA: Diagnosis not present

## 2020-02-12 DIAGNOSIS — L814 Other melanin hyperpigmentation: Secondary | ICD-10-CM | POA: Diagnosis not present

## 2020-02-12 DIAGNOSIS — Z85828 Personal history of other malignant neoplasm of skin: Secondary | ICD-10-CM | POA: Diagnosis not present

## 2020-02-12 DIAGNOSIS — D225 Melanocytic nevi of trunk: Secondary | ICD-10-CM | POA: Diagnosis not present

## 2020-02-12 DIAGNOSIS — D2272 Melanocytic nevi of left lower limb, including hip: Secondary | ICD-10-CM | POA: Diagnosis not present

## 2020-02-12 DIAGNOSIS — I10 Essential (primary) hypertension: Secondary | ICD-10-CM | POA: Diagnosis not present

## 2020-02-12 DIAGNOSIS — D2262 Melanocytic nevi of left upper limb, including shoulder: Secondary | ICD-10-CM | POA: Diagnosis not present

## 2020-02-12 DIAGNOSIS — D2271 Melanocytic nevi of right lower limb, including hip: Secondary | ICD-10-CM | POA: Diagnosis not present

## 2020-02-12 DIAGNOSIS — Z1231 Encounter for screening mammogram for malignant neoplasm of breast: Secondary | ICD-10-CM

## 2020-02-12 DIAGNOSIS — L821 Other seborrheic keratosis: Secondary | ICD-10-CM | POA: Diagnosis not present

## 2020-02-12 DIAGNOSIS — R7301 Impaired fasting glucose: Secondary | ICD-10-CM | POA: Diagnosis not present

## 2020-02-12 DIAGNOSIS — L853 Xerosis cutis: Secondary | ICD-10-CM | POA: Diagnosis not present

## 2020-02-12 DIAGNOSIS — D1801 Hemangioma of skin and subcutaneous tissue: Secondary | ICD-10-CM | POA: Diagnosis not present

## 2020-02-20 DIAGNOSIS — Z0000 Encounter for general adult medical examination without abnormal findings: Secondary | ICD-10-CM | POA: Diagnosis not present

## 2020-02-20 DIAGNOSIS — K760 Fatty (change of) liver, not elsewhere classified: Secondary | ICD-10-CM | POA: Diagnosis not present

## 2020-02-20 DIAGNOSIS — Z853 Personal history of malignant neoplasm of breast: Secondary | ICD-10-CM | POA: Diagnosis not present

## 2020-02-20 DIAGNOSIS — F338 Other recurrent depressive disorders: Secondary | ICD-10-CM | POA: Diagnosis not present

## 2020-02-20 DIAGNOSIS — D649 Anemia, unspecified: Secondary | ICD-10-CM | POA: Diagnosis not present

## 2020-02-20 DIAGNOSIS — E039 Hypothyroidism, unspecified: Secondary | ICD-10-CM | POA: Diagnosis not present

## 2020-02-20 DIAGNOSIS — H6122 Impacted cerumen, left ear: Secondary | ICD-10-CM | POA: Diagnosis not present

## 2020-02-20 DIAGNOSIS — G4700 Insomnia, unspecified: Secondary | ICD-10-CM | POA: Diagnosis not present

## 2020-02-20 DIAGNOSIS — R7301 Impaired fasting glucose: Secondary | ICD-10-CM | POA: Diagnosis not present

## 2020-02-20 DIAGNOSIS — I10 Essential (primary) hypertension: Secondary | ICD-10-CM | POA: Diagnosis not present

## 2020-02-20 DIAGNOSIS — E782 Mixed hyperlipidemia: Secondary | ICD-10-CM | POA: Diagnosis not present

## 2020-02-25 DIAGNOSIS — H6123 Impacted cerumen, bilateral: Secondary | ICD-10-CM | POA: Diagnosis not present

## 2020-03-02 ENCOUNTER — Ambulatory Visit
Admission: RE | Admit: 2020-03-02 | Discharge: 2020-03-02 | Disposition: A | Discharge status: Home / Self Care | Payer: PPO | Source: Ambulatory Visit | Attending: Family Medicine | Admitting: Family Medicine

## 2020-03-02 ENCOUNTER — Other Ambulatory Visit: Payer: Self-pay

## 2020-03-02 DIAGNOSIS — Z1231 Encounter for screening mammogram for malignant neoplasm of breast: Secondary | ICD-10-CM | POA: Diagnosis not present

## 2020-04-14 DIAGNOSIS — L900 Lichen sclerosus et atrophicus: Secondary | ICD-10-CM | POA: Diagnosis not present

## 2020-04-14 DIAGNOSIS — Z01419 Encounter for gynecological examination (general) (routine) without abnormal findings: Secondary | ICD-10-CM | POA: Diagnosis not present

## 2020-04-14 DIAGNOSIS — N3941 Urge incontinence: Secondary | ICD-10-CM | POA: Diagnosis not present

## 2020-04-29 DIAGNOSIS — C50919 Malignant neoplasm of unspecified site of unspecified female breast: Secondary | ICD-10-CM | POA: Diagnosis not present

## 2020-04-29 DIAGNOSIS — E039 Hypothyroidism, unspecified: Secondary | ICD-10-CM | POA: Diagnosis not present

## 2020-04-29 DIAGNOSIS — G4700 Insomnia, unspecified: Secondary | ICD-10-CM | POA: Diagnosis not present

## 2020-04-29 DIAGNOSIS — F329 Major depressive disorder, single episode, unspecified: Secondary | ICD-10-CM | POA: Diagnosis not present

## 2020-04-29 DIAGNOSIS — E782 Mixed hyperlipidemia: Secondary | ICD-10-CM | POA: Diagnosis not present

## 2020-04-29 DIAGNOSIS — Z853 Personal history of malignant neoplasm of breast: Secondary | ICD-10-CM | POA: Diagnosis not present

## 2020-04-29 DIAGNOSIS — I10 Essential (primary) hypertension: Secondary | ICD-10-CM | POA: Diagnosis not present

## 2020-05-09 DIAGNOSIS — L237 Allergic contact dermatitis due to plants, except food: Secondary | ICD-10-CM | POA: Diagnosis not present

## 2020-05-22 DIAGNOSIS — I10 Essential (primary) hypertension: Secondary | ICD-10-CM | POA: Diagnosis not present

## 2020-05-22 DIAGNOSIS — E782 Mixed hyperlipidemia: Secondary | ICD-10-CM | POA: Diagnosis not present

## 2020-05-22 DIAGNOSIS — F329 Major depressive disorder, single episode, unspecified: Secondary | ICD-10-CM | POA: Diagnosis not present

## 2020-05-22 DIAGNOSIS — E039 Hypothyroidism, unspecified: Secondary | ICD-10-CM | POA: Diagnosis not present

## 2020-05-22 DIAGNOSIS — C50919 Malignant neoplasm of unspecified site of unspecified female breast: Secondary | ICD-10-CM | POA: Diagnosis not present

## 2020-05-22 DIAGNOSIS — G4700 Insomnia, unspecified: Secondary | ICD-10-CM | POA: Diagnosis not present

## 2020-05-22 DIAGNOSIS — Z853 Personal history of malignant neoplasm of breast: Secondary | ICD-10-CM | POA: Diagnosis not present

## 2020-06-10 DIAGNOSIS — E782 Mixed hyperlipidemia: Secondary | ICD-10-CM | POA: Diagnosis not present

## 2020-06-10 DIAGNOSIS — I10 Essential (primary) hypertension: Secondary | ICD-10-CM | POA: Diagnosis not present

## 2020-06-10 DIAGNOSIS — E039 Hypothyroidism, unspecified: Secondary | ICD-10-CM | POA: Diagnosis not present

## 2020-06-10 DIAGNOSIS — Z853 Personal history of malignant neoplasm of breast: Secondary | ICD-10-CM | POA: Diagnosis not present

## 2020-06-10 DIAGNOSIS — G4700 Insomnia, unspecified: Secondary | ICD-10-CM | POA: Diagnosis not present

## 2020-06-10 DIAGNOSIS — C50919 Malignant neoplasm of unspecified site of unspecified female breast: Secondary | ICD-10-CM | POA: Diagnosis not present

## 2020-06-10 DIAGNOSIS — F329 Major depressive disorder, single episode, unspecified: Secondary | ICD-10-CM | POA: Diagnosis not present

## 2020-06-22 DIAGNOSIS — M5431 Sciatica, right side: Secondary | ICD-10-CM | POA: Diagnosis not present

## 2020-06-22 DIAGNOSIS — M25551 Pain in right hip: Secondary | ICD-10-CM | POA: Diagnosis not present

## 2020-06-28 DIAGNOSIS — Z20822 Contact with and (suspected) exposure to covid-19: Secondary | ICD-10-CM | POA: Diagnosis not present

## 2020-07-17 DIAGNOSIS — Z853 Personal history of malignant neoplasm of breast: Secondary | ICD-10-CM | POA: Diagnosis not present

## 2020-07-17 DIAGNOSIS — G4700 Insomnia, unspecified: Secondary | ICD-10-CM | POA: Diagnosis not present

## 2020-07-17 DIAGNOSIS — I10 Essential (primary) hypertension: Secondary | ICD-10-CM | POA: Diagnosis not present

## 2020-07-17 DIAGNOSIS — F329 Major depressive disorder, single episode, unspecified: Secondary | ICD-10-CM | POA: Diagnosis not present

## 2020-07-17 DIAGNOSIS — E782 Mixed hyperlipidemia: Secondary | ICD-10-CM | POA: Diagnosis not present

## 2020-07-17 DIAGNOSIS — E039 Hypothyroidism, unspecified: Secondary | ICD-10-CM | POA: Diagnosis not present

## 2020-07-17 DIAGNOSIS — C50919 Malignant neoplasm of unspecified site of unspecified female breast: Secondary | ICD-10-CM | POA: Diagnosis not present

## 2020-08-18 DIAGNOSIS — E039 Hypothyroidism, unspecified: Secondary | ICD-10-CM | POA: Diagnosis not present

## 2020-08-18 DIAGNOSIS — G4700 Insomnia, unspecified: Secondary | ICD-10-CM | POA: Diagnosis not present

## 2020-08-18 DIAGNOSIS — C50919 Malignant neoplasm of unspecified site of unspecified female breast: Secondary | ICD-10-CM | POA: Diagnosis not present

## 2020-08-18 DIAGNOSIS — I10 Essential (primary) hypertension: Secondary | ICD-10-CM | POA: Diagnosis not present

## 2020-08-18 DIAGNOSIS — Z853 Personal history of malignant neoplasm of breast: Secondary | ICD-10-CM | POA: Diagnosis not present

## 2020-08-18 DIAGNOSIS — F329 Major depressive disorder, single episode, unspecified: Secondary | ICD-10-CM | POA: Diagnosis not present

## 2020-08-18 DIAGNOSIS — E782 Mixed hyperlipidemia: Secondary | ICD-10-CM | POA: Diagnosis not present

## 2020-09-15 DIAGNOSIS — M170 Bilateral primary osteoarthritis of knee: Secondary | ICD-10-CM | POA: Diagnosis not present

## 2020-09-15 DIAGNOSIS — M1712 Unilateral primary osteoarthritis, left knee: Secondary | ICD-10-CM | POA: Diagnosis not present

## 2020-09-17 DIAGNOSIS — F329 Major depressive disorder, single episode, unspecified: Secondary | ICD-10-CM | POA: Diagnosis not present

## 2020-09-17 DIAGNOSIS — I10 Essential (primary) hypertension: Secondary | ICD-10-CM | POA: Diagnosis not present

## 2020-09-17 DIAGNOSIS — E782 Mixed hyperlipidemia: Secondary | ICD-10-CM | POA: Diagnosis not present

## 2020-09-17 DIAGNOSIS — E039 Hypothyroidism, unspecified: Secondary | ICD-10-CM | POA: Diagnosis not present

## 2020-09-17 DIAGNOSIS — C50919 Malignant neoplasm of unspecified site of unspecified female breast: Secondary | ICD-10-CM | POA: Diagnosis not present

## 2020-09-17 DIAGNOSIS — G4700 Insomnia, unspecified: Secondary | ICD-10-CM | POA: Diagnosis not present

## 2020-09-17 DIAGNOSIS — Z853 Personal history of malignant neoplasm of breast: Secondary | ICD-10-CM | POA: Diagnosis not present

## 2020-10-13 DIAGNOSIS — M1712 Unilateral primary osteoarthritis, left knee: Secondary | ICD-10-CM | POA: Diagnosis not present

## 2020-10-15 DIAGNOSIS — E782 Mixed hyperlipidemia: Secondary | ICD-10-CM | POA: Diagnosis not present

## 2020-10-15 DIAGNOSIS — G4700 Insomnia, unspecified: Secondary | ICD-10-CM | POA: Diagnosis not present

## 2020-10-15 DIAGNOSIS — F329 Major depressive disorder, single episode, unspecified: Secondary | ICD-10-CM | POA: Diagnosis not present

## 2020-10-15 DIAGNOSIS — Z853 Personal history of malignant neoplasm of breast: Secondary | ICD-10-CM | POA: Diagnosis not present

## 2020-10-15 DIAGNOSIS — C50919 Malignant neoplasm of unspecified site of unspecified female breast: Secondary | ICD-10-CM | POA: Diagnosis not present

## 2020-10-15 DIAGNOSIS — I10 Essential (primary) hypertension: Secondary | ICD-10-CM | POA: Diagnosis not present

## 2020-10-15 DIAGNOSIS — E039 Hypothyroidism, unspecified: Secondary | ICD-10-CM | POA: Diagnosis not present

## 2020-10-20 DIAGNOSIS — M1712 Unilateral primary osteoarthritis, left knee: Secondary | ICD-10-CM | POA: Diagnosis not present

## 2020-11-03 DIAGNOSIS — M1712 Unilateral primary osteoarthritis, left knee: Secondary | ICD-10-CM | POA: Diagnosis not present

## 2020-11-09 DIAGNOSIS — L900 Lichen sclerosus et atrophicus: Secondary | ICD-10-CM | POA: Diagnosis not present

## 2020-11-09 DIAGNOSIS — S3141XA Laceration without foreign body of vagina and vulva, initial encounter: Secondary | ICD-10-CM | POA: Diagnosis not present

## 2020-11-19 DIAGNOSIS — M25562 Pain in left knee: Secondary | ICD-10-CM | POA: Diagnosis not present

## 2020-11-24 ENCOUNTER — Other Ambulatory Visit: Payer: Self-pay | Admitting: Oncology

## 2020-11-25 ENCOUNTER — Other Ambulatory Visit: Payer: Self-pay | Admitting: Oncology

## 2020-11-25 ENCOUNTER — Telehealth: Payer: Self-pay | Admitting: Oncology

## 2020-11-25 ENCOUNTER — Telehealth: Payer: Self-pay | Admitting: *Deleted

## 2020-11-25 DIAGNOSIS — Z95828 Presence of other vascular implants and grafts: Secondary | ICD-10-CM | POA: Insufficient documentation

## 2020-11-25 DIAGNOSIS — I82539 Chronic embolism and thrombosis of unspecified popliteal vein: Secondary | ICD-10-CM

## 2020-11-25 DIAGNOSIS — Z170 Estrogen receptor positive status [ER+]: Secondary | ICD-10-CM

## 2020-11-25 DIAGNOSIS — I2782 Chronic pulmonary embolism: Secondary | ICD-10-CM

## 2020-11-25 DIAGNOSIS — I82409 Acute embolism and thrombosis of unspecified deep veins of unspecified lower extremity: Secondary | ICD-10-CM | POA: Insufficient documentation

## 2020-11-25 DIAGNOSIS — I2699 Other pulmonary embolism without acute cor pulmonale: Secondary | ICD-10-CM | POA: Insufficient documentation

## 2020-11-25 NOTE — Telephone Encounter (Addendum)
Pt called stating that she has an appt tomorrow for labs & wants to know if these labs will tell if she has a clotting disorder.  Informed that message will be sent to Dr Jana Hakim to review. No labs entered.  Pt had been seen for breast cancer & released from practice to see PCP.  She also has appt with Dr Ruta Hinds tomorrow to evaluate greenfield filter.  Message to Dr Jana Hakim.  Dr Jana Hakim reports that labs are for the reason above & he will enter those.  Informed pt.

## 2020-11-25 NOTE — Telephone Encounter (Signed)
Scheduled appts per 1/18 sch msg. Pt confirmed appt dates and times.

## 2020-11-26 ENCOUNTER — Inpatient Hospital Stay: Payer: Medicare HMO | Attending: Oncology

## 2020-11-26 ENCOUNTER — Other Ambulatory Visit: Payer: Self-pay

## 2020-11-26 ENCOUNTER — Ambulatory Visit (INDEPENDENT_AMBULATORY_CARE_PROVIDER_SITE_OTHER): Payer: Medicare HMO | Admitting: Vascular Surgery

## 2020-11-26 ENCOUNTER — Encounter: Payer: Self-pay | Admitting: Vascular Surgery

## 2020-11-26 VITALS — BP 126/76 | HR 62 | Temp 98.0°F | Resp 20 | Ht 64.0 in | Wt 187.0 lb

## 2020-11-26 DIAGNOSIS — M8580 Other specified disorders of bone density and structure, unspecified site: Secondary | ICD-10-CM | POA: Diagnosis not present

## 2020-11-26 DIAGNOSIS — Z86711 Personal history of pulmonary embolism: Secondary | ICD-10-CM | POA: Diagnosis not present

## 2020-11-26 DIAGNOSIS — C50911 Malignant neoplasm of unspecified site of right female breast: Secondary | ICD-10-CM | POA: Insufficient documentation

## 2020-11-26 DIAGNOSIS — I82539 Chronic embolism and thrombosis of unspecified popliteal vein: Secondary | ICD-10-CM

## 2020-11-26 DIAGNOSIS — Z87891 Personal history of nicotine dependence: Secondary | ICD-10-CM | POA: Diagnosis not present

## 2020-11-26 DIAGNOSIS — I2782 Chronic pulmonary embolism: Secondary | ICD-10-CM

## 2020-11-26 DIAGNOSIS — D689 Coagulation defect, unspecified: Secondary | ICD-10-CM | POA: Diagnosis not present

## 2020-11-26 DIAGNOSIS — Z95828 Presence of other vascular implants and grafts: Secondary | ICD-10-CM

## 2020-11-26 DIAGNOSIS — Z79811 Long term (current) use of aromatase inhibitors: Secondary | ICD-10-CM | POA: Insufficient documentation

## 2020-11-26 DIAGNOSIS — C50411 Malignant neoplasm of upper-outer quadrant of right female breast: Secondary | ICD-10-CM

## 2020-11-26 DIAGNOSIS — M25532 Pain in left wrist: Secondary | ICD-10-CM | POA: Diagnosis not present

## 2020-11-26 DIAGNOSIS — Z170 Estrogen receptor positive status [ER+]: Secondary | ICD-10-CM

## 2020-11-26 DIAGNOSIS — M1812 Unilateral primary osteoarthritis of first carpometacarpal joint, left hand: Secondary | ICD-10-CM | POA: Diagnosis not present

## 2020-11-26 LAB — ANTITHROMBIN III: AntiThromb III Func: 97 % (ref 75–120)

## 2020-11-26 NOTE — Progress Notes (Signed)
Referring Physician: Dr. Tonita Cong  Patient name: Jessica Bautista MRN: [id number] DOB: 1941/03/12 Sex: female  REASON FOR CONSULT: Assess IVC filter  HPI: Jessica Bautista is a 80 y.o. female, who is currently being evaluated for a left knee replacement.  She was referred to [place] to assess her previously placed IVC filter.  The patient had an IVC filter placed approximately 2004 after she had a pulmonary embolus following a liposuction procedure.  Since that time she has had no history of DVT.  She has had no lower extremity swelling.  She did have a CT scan of the abdomen in 2016 for evaluation of elevated liver function tests.  This showed a patent IVC filter at that time.    Past Medical History:  Diagnosis Date  . Atypical nevi   . Basal cell carcinoma   . Breast cancer ([place])   . DVT (deep venous thrombosis) ([place])   . Eczema   . Family history of colonic polyps   . Fatty (change of) liver, not elsewhere classified   . Hyperlipidemia   . Hypertension   . Hypothyroidism   . Impaired fasting glucose   . Insomnia   . Lichen sclerosus et atrophicus of the vulva   . Major depression, single episode   . Nontoxic single thyroid nodule   . Osteopenia   . Psoriasis   . Pulmonary embolus ([place])    post op tummy tuc  . Skin cancer   . Thyroid disease   . Vitamin D deficiency, unspecified   . Wears glasses    Past Surgical History:  Procedure Laterality Date  . ABDOMINOPLASTY  1998  . [place]   right  . BREAST LUMPECTOMY Right 01/27/2014   malignant  . BREAST LUMPECTOMY WITH NEEDLE LOCALIZATION Right 01/27/2014   Procedure: BREAST LUMPECTOMY WITH NEEDLE LOCALIZATION;  Surgeon: Edward Jolly, MD;  Location: [place] [facility];  Service: General;  Laterality: Right;  . BREAST SURGERY    . COLONOSCOPY    . TUBAL LIGATION    . WRIST FRACTURE SURGERY  1975   right    Family History  Problem Relation Age of Onset  . Alzheimer's disease Mother   .  Colon polyps Mother   . Heart attack Father 44  . Cancer Brother        LUNG- SMOKER   . Healthy Sister   . Heart attack Brother   . Dementia Brother   . Healthy Sister     SOCIAL HISTORY: Social History   Socioeconomic History  . Marital status: Married    Spouse name: Not on file  . Number of children: Not on file  . Years of education: Not on file  . Highest education level: Not on file  Occupational History  . Not on file  Tobacco Use  . Smoking status: Former Smoker    Packs/day: 1.00    Types: Cigarettes    Quit date: 01/23/1983    Years since quitting: 37.8  . Smokeless tobacco: Never Used  Vaping Use  . Vaping Use: Never used  Substance and Sexual Activity  . Alcohol use: Yes    Alcohol/week: 3.0 standard drinks    Types: 3 Glasses of wine per week  . Drug use: No  . Sexual activity: Never  Other Topics Concern  . Not on file  Social History Narrative  . Not on file   Social Determinants of Health   Financial Resource Strain: Not  on file  Food Insecurity: Not on file  Transportation Needs: Not on file  Physical Activity: Not on file  Stress: Not on file  Social Connections: Not on file  Intimate Partner Violence: Not on file    Allergies  Allergen Reactions  . Tape Itching and Rash    Adhesive Tape  . Wound Dressing Adhesive Itching  . Hrt Support [A-G Pro]     History of pe and dvt    Current Outpatient Medications  Medication Sig Dispense Refill  . b complex vitamins capsule Take 1 capsule by mouth daily.    . Calcium Citrate-Vitamin D (CALCIUM + D PO) Take by mouth.    . citalopram (CELEXA) 20 MG tablet Take 1 tablet (20 mg total) by mouth daily. 90 tablet 4  . clobetasol ointment (TEMOVATE) 0.05 % Applied three times a week 30 g 5  . levothyroxine (SYNTHROID) 88 MCG tablet Take 88 mcg by mouth daily.    . rosuvastatin (CRESTOR) 5 MG tablet Take 5 mg by mouth daily.    Marland Kitchen zolpidem (AMBIEN) 10 MG tablet Take by mouth as needed.  0  .  Cholecalciferol (VITAMIN D-3 PO) Take 2,000 Units by mouth daily. (Patient not taking: Reported on 11/26/2020)    . metoprolol tartrate (LOPRESSOR) 50 MG tablet Take one tablet by mouth the night before your CT and take one tablet by mouth the morning of your CT test (Patient not taking: Reported on 11/26/2020) 2 tablet 0  . Red Yeast Rice Extract (RED YEAST RICE PO) Take by mouth. (Patient not taking: Reported on 11/26/2020)     No current facility-administered medications for this visit.    ROS:   General:  No weight loss, Fever, chills  HEENT: No recent headaches, no nasal bleeding, no visual changes, no sore throat  Neurologic: No dizziness, blackouts, seizures. No recent symptoms of stroke or mini- stroke. No recent episodes of slurred speech, or temporary blindness.  Cardiac: No recent episodes of chest pain/pressure, no shortness of breath at rest.  No shortness of breath with exertion.  Denies history of atrial fibrillation or irregular heartbeat  Vascular: No history of rest pain in feet.  No history of claudication.  No history of non-healing ulcer, No history of DVT   Pulmonary: No home oxygen, no productive cough, no hemoptysis,  No asthma or wheezing  Musculoskeletal:  [X]  Arthritis, [ ]  Low back pain,  [X]  Joint pain  Hematologic:No history of hypercoagulable state.  No history of easy bleeding.  No history of anemia  Gastrointestinal: No hematochezia or melena,  No gastroesophageal reflux, no trouble swallowing  Urinary: [ ]  chronic Kidney disease, [ ]  on HD - [ ]  MWF or [ ]  TTHS, [ ]  Burning with urination, [ ]  Frequent urination, [ ]  Difficulty urinating;   Skin: No rashes  Psychological: No history of anxiety,  No history of depression   Physical Examination  Vitals:   11/26/20 1312  BP: 126/76  Pulse: 62  Resp: 20  Temp: 98 F (36.7 C)  SpO2: 96%  Weight: 187 lb (84.8 kg)  Height: 5\' 4"  (1.626 m)    Body mass index is 32.1 kg/m.  General:  Alert and  oriented, no acute distress HEENT: Normal Neck: No JVD Cardiac: Regular Rate and Rhythm Skin: No rash Extremity Pulses:  2+ radial, brachial, dorsalis pedis pulses bilaterally Musculoskeletal: No deformity or edema  Neurologic: Upper and lower extremity motor 5/5 and symmetric  DATA:  I reviewed the images  of her CT scan of the abdomen dated 2016.  This shows a patent IVC filter about 4 cm below the takeoff of the left renal vein.  The filter is intact with no damage to the struts and no evidence of thrombus within the struts.  She has no duplicated veins to suggest any other collateral pathways.  Proximal portions of the iliac veins are patent.  ASSESSMENT: Clinically patent IVC filter.  CT scan documentation of good position of the filter in 2016.  I have no reason to believe there are any complicating features that would be a contraindication to her knee replacement.   PLAN: No further evaluation of IVC filter necessary patient will follow-up on an as needed basis   Ruta Hinds, MD [facility] Office: [phone]

## 2020-11-27 LAB — PROTEIN C ACTIVITY: Protein C Activity: 149 % (ref 73–180)

## 2020-11-27 LAB — HOMOCYSTEINE: Homocysteine: 10.1 umol/L (ref 0.0–19.2)

## 2020-11-27 LAB — LUPUS ANTICOAGULANT PANEL
DRVVT: 40.5 s (ref 0.0–47.0)
PTT Lupus Anticoagulant: 33 s (ref 0.0–51.9)

## 2020-11-27 LAB — PROTEIN S, TOTAL: Protein S Ag, Total: 83 % (ref 60–150)

## 2020-11-27 LAB — FACTOR 5 LEIDEN

## 2020-11-27 LAB — PROTHROMBIN GENE MUTATION

## 2020-11-27 LAB — PROTEIN C, TOTAL: Protein C, Total: 130 % (ref 60–150)

## 2020-11-27 LAB — PROTEIN S ACTIVITY: Protein S Activity: 86 % (ref 63–140)

## 2020-11-28 LAB — BETA-2-GLYCOPROTEIN I ABS, IGG/M/A
Beta-2 Glyco I IgG: <9 GPI IgG units (ref 0–20)
Beta-2-Glycoprotein I IgA: <9 GPI IgA units (ref 0–25)
Beta-2-Glycoprotein I IgM: <9 GPI IgM units (ref 0–32)

## 2020-11-28 LAB — CARDIOLIPIN ANTIBODIES, IGG, IGM, IGA
Anticardiolipin IgA: <9 APL U/mL (ref 0–11)
Anticardiolipin IgG: <9 GPL U/mL (ref 0–14)
Anticardiolipin IgM: <9 MPL U/mL (ref 0–12)

## 2020-11-29 NOTE — Progress Notes (Signed)
[place]  [phone] [phone]     ID: Jessica Bautista OB: 10-05-1941  MR#: [id number]  ZGY#:[id number]  Patient Care Team: Hulan Fess, MD as PCP - General (Family Medicine) Excell Seltzer, MD (Inactive) as Consulting Physician (General Surgery) Eppie Gibson, MD as Attending Physician (Radiation Oncology) Aiza Vollrath, Virgie Dad, MD as Consulting Physician (Oncology) Susa Day, MD as Consulting Physician (Orthopedic Surgery) OTHER MD:    CHIEF COMPLAINT: Early stage estrogen receptor positive breast cancer  CURRENT TREATMENT: observation   INTERVAL HISTORY: Jessica Bautista returns today for follow-up of her estrogen receptor positive breast cancer. She was last seen in 04/2019 when she was released from follow up.  Since her last visit, she underwent bilateral screening mammography with tomography at [facility] on 03/02/2020 showing: breast density category B; no evidence of malignancy in either breast.   More recently she was evaluated by Dr. Tonita Cong for pain in her left knee. After evaluation he feels she well may need total knee replacement for end-stage osteoarthrosis of the left knee. He is concerned about the history of DVT and PE and the patient having a [place] filter in place. He requested a coagulopathy evaluation and specific recommendations regarding anticoagulation postoperatively.   He also referred her for possible IVF retrieval.  She was evaluated by Dr. Oneida Alar who reviewed CT scans of the abdomen images dated 2016 showing a patent filter 4 cm below the takeoff of the left renal vein, intact, with no damage to the struts, no evidence of thrombus within the struts, and no evidence of collaterals.  The proximal portions of the iliac veins are patent.  He saw no complicating features that would contraindicate her knee replacement  COAGULOPATHY HISTORY: Jessica Bautista tells me she went to [facility] for liposuction she thinks around the year  2000.  Going into the [facility] records she was evaluated by Beatrix Fetters and Clinical cytogeneticist in 1997.  Unfortunately I am only able to see the dates of the visits.  There are no informative notes.  Jessica Bautista tells me Dr. Jaclynn Guarneri performed liposuction and that resulted in a very large hematoma in her abdomen.  They tried several surgeries to clear this but were not successful and her abdominal wound had to heal by secondary intention.  On the day she got home after the original procedure she had some symptoms which brought her right back and she was found to have a pulmonary embolus.  It appears that because of the concurrent hematoma in the abdomen they did not feel anticoagulation was safe and an IVC filter was placed.  She never received anticoagulation for her pulmonary embolus.  She has had right ankle surgery and right wrist surgery and wisdom teeth removal with no bleeding or clotting complications.  She has also had 5 live births with no clotting or bleeding complications.  Her parents and siblings do not have a history of clotting or bleeding problems except for one brother who died in his 3s from a myocardial infarction in the setting of tobacco abuse.  Of her 5 children 1 unfortunately died from lung cancer but again has been no bleeding or clotting complications.  She has never taken aspirin.  She never used oral contraceptives and never received hormone replacement therapy  REVIEW OF SYSTEMS: Jessica Bautista was doing okay as far as her left knee was concerned it and she took a very long trip to [place], [place], [place], and other places.  That "finished all of my knee".  Currently she is doing water aerobics and water walking for exercise.  A detailed review of systems was otherwise noncontributory   COVID 19 VACCINATION STATUS:    BREAST CANCER HISTORY: From the original intake note:  Jessica Bautista had routine bilateral screening mammography at the breast Center to 01/24/2014. A possible mass in the right breast  was noted, and she was recalled for a right diagnostic mammogram and ultrasound 01-2014. This showed an irregular 8 mm mass in the lateral right breast, with a second more circumscribed 7 mm mass inferiorly, which was stable. There was no palpable mass by exam. Ultrasound showed a 7 mm irregular hypoechoic mass in the right breast upper outer quadrant. The second area noted on mammography was a simple cyst. There was no right axillary adenopathy.  Biopsy of the questionable mass 01/08/2014 showed ([id number]) an invasive ductal carcinoma with papillary features, grade 2estrogen and progesterone receptor positive, with an MIB-1 of 15 and no HER-2 amplification, the signals ratio being 1.19 in the number per cell 1.55.   The patient's subsequent history is as detailed below   PAST MEDICAL HISTORY: Past Medical History:  Diagnosis Date  . Atypical nevi   . Basal cell carcinoma   . Breast cancer ([place])   . DVT (deep venous thrombosis) ([place])   . Eczema   . Family history of colonic polyps   . Fatty (change of) liver, not elsewhere classified   . Hyperlipidemia   . Hypertension   . Hypothyroidism   . Impaired fasting glucose   . Insomnia   . Lichen sclerosus et atrophicus of the vulva   . Major depression, single episode   . Nontoxic single thyroid nodule   . Osteopenia   . Psoriasis   . Pulmonary embolus ([employer])    post op tummy tuc  . Skin cancer   . Thyroid disease   . Vitamin D deficiency, unspecified   . Wears glasses     PAST SURGICAL HISTORY: Past Surgical History:  Procedure Laterality Date  . ABDOMINOPLASTY  1998  . [place]   right  . BREAST LUMPECTOMY Right 01/27/2014   malignant  . BREAST LUMPECTOMY WITH NEEDLE LOCALIZATION Right 01/27/2014   Procedure: BREAST LUMPECTOMY WITH NEEDLE LOCALIZATION;  Surgeon: Edward Jolly, MD;  Location: [facility];  Service: General;  Laterality: Right;  . BREAST SURGERY    . COLONOSCOPY    .  TUBAL LIGATION    . WRIST FRACTURE SURGERY  1975   right    FAMILY HISTORY Family History  Problem Relation Age of Onset  . Alzheimer's disease Mother   . Colon polyps Mother   . Heart attack Father 66  . Cancer Brother        LUNG- SMOKER   . Healthy Sister   . Heart attack Brother   . Dementia Brother   . Healthy Sister    The patient's father died at the age of 53 with multiple comorbidities including diabetes heart disease and COPD. The patient's mother died at the age of 65. The patient had 3 brothers, one of whom died from lung cancer at the age of 68. He was a smoker. She had 2 sisters. There is no history of breast or ovarian cancer in the family.   GYNECOLOGIC HISTORY:  Menarche age 36, first live birth age 77, the patient is [place] P5. She stopped having periods "about 20 years ago". She did not take hormone replacement. 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 Her husband Truman Hayward passed away at 51 years old. Son Suezanne Jacquet lives in [place] and is in pacemaker sales. Daughter [name] lives in [place] where she works for a Herbalist areas. Daughter [name] heritage lives in [place] and is an Glass blower/designer. Son [name] lives in [place] and is a Probation officer. Son [name] also lives in [place], and works in plumbing. The patient has multiple grandchildren. She attends [employer]    ADVANCED DIRECTIVES: In place   HEALTH MAINTENANCE: Social History   Tobacco Use  . Smoking status: Former Smoker    Packs/day: 1.00    Types: Cigarettes    Quit date: 01/23/1983    Years since quitting: 37.8  . Smokeless tobacco: Never Used  Vaping Use  . Vaping Use: Never used  Substance Use Topics  . Alcohol use: Yes    Alcohol/week: 3.0 standard drinks    Types: 3 Glasses of wine per week  . Drug use: No      Colonoscopy:  PAP:  Bone density: 01/04/2018, t-score of -1.1   Lipid panel:  Allergies  Allergen Reactions  . Tape Itching and Rash    Adhesive Tape  . Wound Dressing Adhesive Itching  . Hrt Support [A-G Pro]     History of pe and dvt    Current Outpatient Medications  Medication Sig Dispense Refill  . b complex vitamins capsule Take 1 capsule by mouth daily.    . Calcium Citrate-Vitamin D (CALCIUM + D PO) Take by mouth.    . Cholecalciferol (VITAMIN D-3 PO) Take 2,000 Units by mouth daily. (Patient not taking: Reported on 11/26/2020)    . citalopram (CELEXA) 20 MG tablet Take 1 tablet (20 mg total) by mouth daily. 90 tablet 4  . clobetasol ointment (TEMOVATE) 0.05 % Applied three times a week 30 g 5  . levothyroxine (SYNTHROID) 88 MCG tablet Take 88 mcg by mouth daily.    . metoprolol tartrate (LOPRESSOR) 50 MG tablet Take one tablet by mouth the night before your CT and take one tablet by mouth the morning of your CT test (Patient not taking: Reported on 11/26/2020) 2 tablet 0  . Red Yeast Rice Extract (RED YEAST RICE PO) Take by mouth. (Patient not taking: Reported on 11/26/2020)    . rosuvastatin (CRESTOR) 5 MG tablet Take 5 mg by mouth daily.    Marland Kitchen zolpidem (AMBIEN) 10 MG tablet Take by mouth as needed.  0   No current facility-administered medications for this visit.    OBJECTIVE: White woman who appears younger than stated age  66:   11/30/20 1649  BP: (!) 141/53  Pulse: 66  Resp: 18  Temp: 98.3 F (36.8 C)  SpO2: 97%     Body mass index is 31.19 kg/m.    ECOG FS:0 - Asymptomatic   Sclerae unicteric, EOMs intact Wearing a mask No cervical or supraclavicular adenopathy Lungs no rales or rhonchi Heart regular rate and rhythm Abd soft, nontender, positive bowel sounds MSK no focal spinal tenderness, wearing a left knee brace Neuro: nonfocal, well oriented, appropriate affect Breasts: Deferred   LAB RESULTS:  CMP     Component Value Date/Time   NA  146 (H) 04/23/2019 1029   NA 143 05/03/2017 0950   K 4.5 04/23/2019 1029   K 4.7 05/03/2017  0950   CL 106 04/23/2019 1029   CO2 26 04/23/2019 1029   CO2 29 05/03/2017 0950   GLUCOSE 111 (H) 04/23/2019 1029   GLUCOSE 109 05/03/2017 0950   BUN 12 04/23/2019 1029   BUN 12.0 05/03/2017 0950   CREATININE 0.78 04/23/2019 1029   CREATININE 0.8 05/03/2017 0950   CALCIUM 9.4 04/23/2019 1029   CALCIUM 9.8 05/03/2017 0950   PROT 7.1 05/03/2017 0950   ALBUMIN 3.8 05/03/2017 0950   AST 18 05/03/2017 0950   ALT 15 05/03/2017 0950   ALKPHOS 72 05/03/2017 0950   BILITOT 0.71 05/03/2017 0950   GFRNONAA 73 04/23/2019 1029   GFRAA 84 04/23/2019 1029    No results found for: SPEP  Lab Results  Component Value Date   WBC 7.4 05/03/2017   NEUTROABS 4.9 05/03/2017   HGB 12.8 05/03/2017   HCT 38.1 05/03/2017   MCV 95.2 05/03/2017   PLT 258 05/03/2017      Chemistry      Component Value Date/Time   NA 146 (H) 04/23/2019 1029   NA 143 05/03/2017 0950   K 4.5 04/23/2019 1029   K 4.7 05/03/2017 0950   CL 106 04/23/2019 1029   CO2 26 04/23/2019 1029   CO2 29 05/03/2017 0950   BUN 12 04/23/2019 1029   BUN 12.0 05/03/2017 0950   CREATININE 0.78 04/23/2019 1029   CREATININE 0.8 05/03/2017 0950      Component Value Date/Time   CALCIUM 9.4 04/23/2019 1029   CALCIUM 9.8 05/03/2017 0950   ALKPHOS 72 05/03/2017 0950   AST 18 05/03/2017 0950   ALT 15 05/03/2017 0950   BILITOT 0.71 05/03/2017 0950       No results found for: LABCA2  No components found for: [id number]  No results for input(s): INR in the last 168 hours.  Urinalysis No results found for: COLORURINE   STUDIES: We obtained a hypercoagulable panel which was entirely normal.  Unfortunately the 2 most important tests, the factor V Leiden and prothrombin gene mutation could not be obtained because the sample was "degraded".  ASSESSMENT: 80 y.o. Jessica Bautista woman status post right breast biopsy 01/08/2014 for a clinical T1b  N0, stage IA invasive ductal carcinoma, grade 2, estrogen and progesterone receptor positive, with an MIB-1 of 15% and no HER-2 amplification  (1) s/p Right lumpectomy 01/27/2014 for a pT1c NX, stage I invasive lobular carcinoma, HER-2 again negative, margins close but negative  (2) the consensus at the multidisciplinary conference 02/12/2014 was that radiation was optional; the patient discussed this with her radiation oncologist and opted against radiation  (3) anastrozole started April 2015;   (a) bone density scan on 12/29/15 showed osteopenia with a t-score of -1.3  (b) DEXA scan on 01/04/2018 shows a T score of -1.1  (4) coagulopathy:  (a) history of abdominal hematomas after liposuction 1997  (b) history of pulmonary embolism post-op 1997   (a) s/p IVF filter placement 1997   PLAN: I reviewed Jessica Bautista history as detailed above.  Basically she had what appears to have been excessive bleeding after a liposuction procedure.  She then had a pulmonary embolus associated with the surgeries she was undergoing to clear the clots.  That is why she received no anticoagulation and instead had an IVC filter placed.  The IVC filter appears to be still patent and functional per Dr. Nona Dell review.  We need to complete the hypercoagulable review with the factor V Leiden and prothrombin gene mutation and given the above history  she also needs evaluation for possible von Willebrand's disease.  Those labs will be obtained 12/07/2020 and we should have a final result within a week of that date  If all the lab work is negative my recommendation will be to use rivaroxaban/Xarelto 10 mg daily beginning 6 to 18 hours after surgery, and continuing for 14 days, or longer if the patient is not fully ambulatory by then.  I will have a virtual visit with Jessica Bautista 12/14/2020 to review results of her work-up  Total encounter time 55 minutes.*      Jessica Bautista, Virgie Dad, MD  11/30/20 5:50 PM Medical Oncology and  Hematology [facility] [address] [place], [place] [address] Tel. [phone]    Fax. [phone]   I, Wilburn Mylar, am acting as scribe for Dr. Virgie Dad. Muhammadali Ries.  I, Lurline Del MD, have reviewed the above documentation for accuracy and completeness, and I agree with the above.   *Total Encounter Time as defined by the Centers for Medicare and Medicaid Services includes, in addition to the face-to-face time of a patient visit (documented in the note above) non-face-to-face time: obtaining and reviewing outside history, ordering and reviewing medications, tests or procedures, care coordination (communications with other health care professionals or caregivers) and documentation in the medical record.

## 2020-11-30 ENCOUNTER — Other Ambulatory Visit: Payer: Self-pay

## 2020-11-30 ENCOUNTER — Inpatient Hospital Stay (HOSPITAL_BASED_OUTPATIENT_CLINIC_OR_DEPARTMENT_OTHER): Payer: Medicare HMO | Admitting: Oncology

## 2020-11-30 VITALS — BP 141/53 | HR 66 | Temp 98.3°F | Resp 18 | Ht 64.0 in | Wt 181.7 lb

## 2020-11-30 DIAGNOSIS — M8580 Other specified disorders of bone density and structure, unspecified site: Secondary | ICD-10-CM

## 2020-11-30 DIAGNOSIS — C50911 Malignant neoplasm of unspecified site of right female breast: Secondary | ICD-10-CM | POA: Diagnosis not present

## 2020-11-30 DIAGNOSIS — D689 Coagulation defect, unspecified: Secondary | ICD-10-CM | POA: Insufficient documentation

## 2020-11-30 DIAGNOSIS — Z170 Estrogen receptor positive status [ER+]: Secondary | ICD-10-CM | POA: Diagnosis not present

## 2020-11-30 DIAGNOSIS — Z79811 Long term (current) use of aromatase inhibitors: Secondary | ICD-10-CM | POA: Diagnosis not present

## 2020-11-30 DIAGNOSIS — Z87891 Personal history of nicotine dependence: Secondary | ICD-10-CM | POA: Diagnosis not present

## 2020-11-30 DIAGNOSIS — Z95828 Presence of other vascular implants and grafts: Secondary | ICD-10-CM

## 2020-11-30 DIAGNOSIS — I2699 Other pulmonary embolism without acute cor pulmonale: Secondary | ICD-10-CM | POA: Diagnosis not present

## 2020-11-30 DIAGNOSIS — C50411 Malignant neoplasm of upper-outer quadrant of right female breast: Secondary | ICD-10-CM

## 2020-11-30 DIAGNOSIS — Z86711 Personal history of pulmonary embolism: Secondary | ICD-10-CM | POA: Diagnosis not present

## 2020-12-02 ENCOUNTER — Telehealth: Payer: Self-pay | Admitting: Oncology

## 2020-12-02 NOTE — Telephone Encounter (Signed)
Scheduled appts per 1/20 los. Pt confirmed appt date and time.  

## 2020-12-07 ENCOUNTER — Other Ambulatory Visit: Payer: Self-pay

## 2020-12-07 ENCOUNTER — Inpatient Hospital Stay: Payer: Medicare HMO

## 2020-12-07 DIAGNOSIS — D689 Coagulation defect, unspecified: Secondary | ICD-10-CM | POA: Diagnosis not present

## 2020-12-07 DIAGNOSIS — Z170 Estrogen receptor positive status [ER+]: Secondary | ICD-10-CM

## 2020-12-07 DIAGNOSIS — M8580 Other specified disorders of bone density and structure, unspecified site: Secondary | ICD-10-CM

## 2020-12-07 DIAGNOSIS — C50411 Malignant neoplasm of upper-outer quadrant of right female breast: Secondary | ICD-10-CM

## 2020-12-07 DIAGNOSIS — Z86711 Personal history of pulmonary embolism: Secondary | ICD-10-CM | POA: Diagnosis not present

## 2020-12-07 DIAGNOSIS — C50911 Malignant neoplasm of unspecified site of right female breast: Secondary | ICD-10-CM | POA: Diagnosis not present

## 2020-12-07 DIAGNOSIS — Z87891 Personal history of nicotine dependence: Secondary | ICD-10-CM | POA: Diagnosis not present

## 2020-12-07 DIAGNOSIS — I2699 Other pulmonary embolism without acute cor pulmonale: Secondary | ICD-10-CM

## 2020-12-07 DIAGNOSIS — Z79811 Long term (current) use of aromatase inhibitors: Secondary | ICD-10-CM | POA: Diagnosis not present

## 2020-12-07 DIAGNOSIS — Z95828 Presence of other vascular implants and grafts: Secondary | ICD-10-CM

## 2020-12-07 LAB — APTT: aPTT: 28 seconds (ref 24–36)

## 2020-12-07 LAB — PROTIME-INR
INR: 0.9 (ref 0.8–1.2)
Prothrombin Time: 11.8 seconds (ref 11.4–15.2)

## 2020-12-08 DIAGNOSIS — S3141XA Laceration without foreign body of vagina and vulva, initial encounter: Secondary | ICD-10-CM | POA: Diagnosis not present

## 2020-12-08 DIAGNOSIS — L900 Lichen sclerosus et atrophicus: Secondary | ICD-10-CM | POA: Diagnosis not present

## 2020-12-10 LAB — FACTOR 5 LEIDEN

## 2020-12-11 ENCOUNTER — Ambulatory Visit: Payer: Self-pay | Admitting: Orthopedic Surgery

## 2020-12-11 DIAGNOSIS — H524 Presbyopia: Secondary | ICD-10-CM | POA: Diagnosis not present

## 2020-12-11 DIAGNOSIS — H3589 Other specified retinal disorders: Secondary | ICD-10-CM | POA: Diagnosis not present

## 2020-12-11 DIAGNOSIS — H04123 Dry eye syndrome of bilateral lacrimal glands: Secondary | ICD-10-CM | POA: Diagnosis not present

## 2020-12-11 LAB — VON WILLEBRAND PANEL
Coagulation Factor VIII: 188 % — ABNORMAL HIGH (ref 56–140)
Ristocetin Co-factor, Plasma: 131 % (ref 50–200)
Von Willebrand Antigen, Plasma: 137 % (ref 50–200)

## 2020-12-11 LAB — PROTHROMBIN GENE MUTATION

## 2020-12-11 LAB — COAG STUDIES INTERP REPORT

## 2020-12-14 ENCOUNTER — Encounter: Payer: Self-pay | Admitting: Oncology

## 2020-12-14 ENCOUNTER — Telehealth (HOSPITAL_BASED_OUTPATIENT_CLINIC_OR_DEPARTMENT_OTHER): Payer: Medicare HMO | Admitting: Oncology

## 2020-12-14 DIAGNOSIS — D689 Coagulation defect, unspecified: Secondary | ICD-10-CM

## 2020-12-14 DIAGNOSIS — C50411 Malignant neoplasm of upper-outer quadrant of right female breast: Secondary | ICD-10-CM | POA: Diagnosis not present

## 2020-12-14 DIAGNOSIS — Z170 Estrogen receptor positive status [ER+]: Secondary | ICD-10-CM

## 2020-12-14 NOTE — Progress Notes (Signed)
[place]  [phone] [phone]     ID: Jessica Bautista OB: October 08, 1941  MR#: [id number]  HWE#:[id number]  Patient Care Team: Hulan Fess, MD as PCP - General (Family Medicine) Excell Seltzer, MD (Inactive) as Consulting Physician (General Surgery) Eppie Gibson, MD as Attending Physician (Radiation Oncology) Yatzary Merriweather, Virgie Dad, MD as Consulting Physician (Oncology) Susa Day, MD as Consulting Physician (Orthopedic Surgery) OTHER MD:   I connected with Jessica Bautista on 12/14/20 at  3:00 PM EST by telephone visit and verified that I am speaking with the correct person using two identifiers.   I discussed the limitations, risks, security and privacy concerns of performing an evaluation and management service by telemedicine and the availability of in-person appointments. I also discussed with the patient that there may be a patient responsible charge related to this service. The patient expressed understanding and agreed to proceed.   Other persons participating in the visit and their role in the encounter: None  Patient's location: Home Provider's location: [place]: Early stage estrogen receptor positive breast cancer; coagulopathy  CURRENT TREATMENT: observation   INTERVAL HISTORY: Jessica Bautista was contacted today for follow-up of her estrogen receptor positive breast cancer. She continues under observation.  Since her last visit, she resubmitted samples for genetic testing on 12/07/2020. Factor V Leiden and Prothrombin mutations were tested, and both were negative.  She also had a von Willebrand panel which was entirely normal.  Her INR and APTT were normal as well.  She is scheduled for knee replacement under Dr. Tonita Cong on 01/06/2021.   REVIEW OF SYSTEMS: Jessica Bautista    COVID 19 VACCINATION STATUS:    BREAST CANCER HISTORY: From the original intake note:  Jessica Bautista had routine bilateral screening  mammography at the breast Center to 01/24/2014. A possible mass in the right breast was noted, and she was recalled for a right diagnostic mammogram and ultrasound 01-2014. This showed an irregular 8 mm mass in the lateral right breast, with a second more circumscribed 7 mm mass inferiorly, which was stable. There was no palpable mass by exam. Ultrasound showed a 7 mm irregular hypoechoic mass in the right breast upper outer quadrant. The second area noted on mammography was a simple cyst. There was no right axillary adenopathy.  Biopsy of the questionable mass 01/08/2014 showed ([id number]) an invasive ductal carcinoma with papillary features, grade 2estrogen and progesterone receptor positive, with an MIB-1 of 15 and no HER-2 amplification, the signals ratio being 1.19 in the number per cell 1.55.   COAGULOPATHY HISTORY: Jessica Bautista tells me she went to [facility] for liposuction she thinks around the year 2000.  Going into the [facility] records she was evaluated by Beatrix Fetters and Clinical cytogeneticist in 1997.  Unfortunately I am only able to see the dates of the visits.  There are no informative notes.  Jessica Bautista tells me Dr. Jaclynn Guarneri performed liposuction and that resulted in a very large hematoma in her abdomen.  They tried several surgeries to clear this but were not successful and her abdominal wound had to heal by secondary intention.  On the day she got home after the original procedure she had some symptoms which brought her right back and she was found to have a pulmonary embolus.  It appears that because of the concurrent hematoma in the abdomen they did not feel anticoagulation was safe and an IVC filter was placed.  She never received anticoagulation for her pulmonary embolus.  She has had right ankle surgery and right wrist surgery and wisdom teeth removal with no bleeding or clotting complications.  She has also had 5 live births with no clotting or bleeding complications.  Her parents and siblings do not  have a history of clotting or bleeding problems except for one brother who died in his 102s from a myocardial infarction in the setting of tobacco abuse.  Of her 5 children 1 unfortunately died from lung cancer but again has been no bleeding or clotting complications.  She has never taken aspirin.  She never used oral contraceptives and never received hormone replacement therapy  The patient's subsequent history is as detailed below   PAST MEDICAL HISTORY: Past Medical History:  Diagnosis Date  . Atypical nevi   . Basal cell carcinoma   . Breast cancer ([place])   . DVT (deep venous thrombosis) ([place])   . Eczema   . Family history of colonic polyps   . Fatty (change of) liver, not elsewhere classified   . Hyperlipidemia   . Hypertension   . Hypothyroidism   . Impaired fasting glucose   . Insomnia   . Lichen sclerosus et atrophicus of the vulva   . Major depression, single episode   . Nontoxic single thyroid nodule   . Osteopenia   . Psoriasis   . Pulmonary embolus ([place])    post op tummy tuc  . Skin cancer   . Thyroid disease   . Vitamin D deficiency, unspecified   . Wears glasses     PAST SURGICAL HISTORY: Past Surgical History:  Procedure Laterality Date  . ABDOMINOPLASTY  1998  . [place]   right  . BREAST LUMPECTOMY Right 01/27/2014   malignant  . BREAST LUMPECTOMY WITH NEEDLE LOCALIZATION Right 01/27/2014   Procedure: BREAST LUMPECTOMY WITH NEEDLE LOCALIZATION;  Surgeon: Edward Jolly, MD;  Location: [facility];  Service: General;  Laterality: Right;  . BREAST SURGERY    . COLONOSCOPY    . TUBAL LIGATION    . WRIST FRACTURE SURGERY  1975   right    FAMILY HISTORY Family History  Problem Relation Age of Onset  . Alzheimer's disease Mother   . Colon polyps Mother   . Heart attack Father 16  . Cancer Brother        LUNG- SMOKER   . Healthy Sister   . Heart attack Brother   . Dementia Brother   . Healthy Sister   The  patient's father died at the age of 5 with multiple comorbidities including diabetes heart disease and COPD. The patient's mother died at the age of 61. The patient had 3 brothers, one of whom died from lung cancer at the age of 75. He was a smoker. She had 2 sisters. There is no history of breast or ovarian cancer in the family.   GYNECOLOGIC HISTORY:  Menarche age 89, first live birth age 83, the patient is [place] P5. She stopped having periods "about 20 years ago". She did not take hormone replacement. 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 Her husband Truman Hayward passed away at 71 years old. Son Suezanne Jacquet lives in [place] and is in pacemaker sales. Daughter [name] lives in [place] where she works for a Herbalist areas. Daughter [name] heritage lives in [place] and is  an Glass blower/designer. Son [name] lives in [place] and is a Probation officer. Son [name] also lives in [place], and works in plumbing. The patient has multiple grandchildren. She attends [employer]    ADVANCED DIRECTIVES: In place   HEALTH MAINTENANCE: Social History   Tobacco Use  . Smoking status: Former Smoker    Packs/day: 1.00    Types: Cigarettes    Quit date: 01/23/1983    Years since quitting: 37.9  . Smokeless tobacco: Never Used  Vaping Use  . Vaping Use: Never used  Substance Use Topics  . Alcohol use: Yes    Alcohol/week: 3.0 standard drinks    Types: 3 Glasses of wine per week  . Drug use: No     Colonoscopy:  PAP:  Bone density: 01/04/2018, t-score of -1.1   Lipid panel:  Allergies  Allergen Reactions  . Tape Itching and Rash    Adhesive Tape  . Wound Dressing Adhesive Itching  . Hrt Support [A-G Pro]     History of pe and dvt    Current Outpatient Medications  Medication Sig Dispense Refill  . b complex vitamins capsule Take 1  capsule by mouth daily.    . Calcium Citrate-Vitamin D (CALCIUM + D PO) Take by mouth.    . Cholecalciferol (VITAMIN D-3 PO) Take 2,000 Units by mouth daily. (Patient not taking: Reported on 11/26/2020)    . citalopram (CELEXA) 20 MG tablet Take 1 tablet (20 mg total) by mouth daily. 90 tablet 4  . clobetasol ointment (TEMOVATE) 0.05 % Applied three times a week 30 g 5  . levothyroxine (SYNTHROID) 88 MCG tablet Take 88 mcg by mouth daily.    . metoprolol tartrate (LOPRESSOR) 50 MG tablet Take one tablet by mouth the night before your CT and take one tablet by mouth the morning of your CT test (Patient not taking: Reported on 11/26/2020) 2 tablet 0  . Red Yeast Rice Extract (RED YEAST RICE PO) Take by mouth. (Patient not taking: Reported on 11/26/2020)    . rosuvastatin (CRESTOR) 5 MG tablet Take 5 mg by mouth daily.    Marland Kitchen zolpidem (AMBIEN) 10 MG tablet Take by mouth as needed.  0   No current facility-administered medications for this visit.    OBJECTIVE: White woman who appears younger than stated age  There were no vitals filed for this visit.   There is no height or weight on file to calculate BMI.    ECOG FS:1 - Symptomatic but completely ambulatory   Telemedicine visit 12/14/2020   LAB RESULTS:  CMP     Component Value Date/Time   NA 146 (H) 04/23/2019 1029   NA 143 05/03/2017 0950   K 4.5 04/23/2019 1029   K 4.7 05/03/2017 0950   CL 106 04/23/2019 1029   CO2 26 04/23/2019 1029   CO2 29 05/03/2017 0950   GLUCOSE 111 (H) 04/23/2019 1029   GLUCOSE 109 05/03/2017 0950   BUN 12 04/23/2019 1029   BUN 12.0 05/03/2017 0950   CREATININE 0.78 04/23/2019 1029   CREATININE 0.8 05/03/2017 0950   CALCIUM 9.4 04/23/2019 1029   CALCIUM 9.8 05/03/2017 0950   PROT 7.1 05/03/2017 0950   ALBUMIN 3.8 05/03/2017 0950   AST 18 05/03/2017 0950   ALT 15 05/03/2017 0950   ALKPHOS 72 05/03/2017 0950   BILITOT 0.71 05/03/2017 0950   GFRNONAA 73 04/23/2019 1029   GFRAA 84 04/23/2019 1029    No  results found for: SPEP  Lab Results  Component Value Date   WBC 7.4 05/03/2017   NEUTROABS 4.9 05/03/2017   HGB 12.8 05/03/2017   HCT 38.1 05/03/2017   MCV 95.2 05/03/2017   PLT 258 05/03/2017      Chemistry      Component Value Date/Time   NA 146 (H) 04/23/2019 1029   NA 143 05/03/2017 0950   K 4.5 04/23/2019 1029   K 4.7 05/03/2017 0950   CL 106 04/23/2019 1029   CO2 26 04/23/2019 1029   CO2 29 05/03/2017 0950   BUN 12 04/23/2019 1029   BUN 12.0 05/03/2017 0950   CREATININE 0.78 04/23/2019 1029   CREATININE 0.8 05/03/2017 0950      Component Value Date/Time   CALCIUM 9.4 04/23/2019 1029   CALCIUM 9.8 05/03/2017 0950   ALKPHOS 72 05/03/2017 0950   AST 18 05/03/2017 0950   ALT 15 05/03/2017 0950   BILITOT 0.71 05/03/2017 0950       No results found for: LABCA2  No components found for: HGDJM426  No results for input(s): INR in the last 168 hours.  Urinalysis No results found for: COLORURINE   STUDIES: No results found.    ASSESSMENT: 80 y.o. [place] woman status post right breast biopsy 01/08/2014 for a clinical T1b N0, stage IA invasive ductal carcinoma, grade 2, estrogen and progesterone receptor positive, with an MIB-1 of 15% and no HER-2 amplification  (1) s/p Right lumpectomy 01/27/2014 for a pT1c NX, stage I invasive lobular carcinoma, HER-2 again negative, margins close but negative  (2) the consensus at the multidisciplinary conference 02/12/2014 was that radiation was optional; the patient discussed this with her radiation oncologist and opted against radiation  (3) anastrozole started April 2015;   (a) bone density scan on 12/29/15 showed osteopenia with a t-score of -1.3  (b) DEXA scan on 01/04/2018 shows a T score of -1.1  (4) coagulopathy:  (a) history of abdominal hematomas after liposuction 1997  (b) history of pulmonary embolism post-op 1997   (a) s/p IVF filter placement 1997; this was reviewed by vascular (Dr Oneida Alar) and found to  be functional, with no evidence of associated clots  (c) coagulation panel since 11/26/2020 entirely normal.  However some items were left out of the test  (d) additional testing including the factor V Leiden and prothrombin gene mutation also showed no abnormalities  (e) von Willebrand panel obtained given concern about bleeding diathesis was normal  (5) for postoperative prophylaxis we recommend rivaroxaban/Xarelto 10 mg daily beginning 6 to 18 hours after surgery, and continuing for 14 days, or longer if the patient is not fully ambulatory by then.  PLAN: I called Jessica Bautista with the results.  She was very pleased.  She had questions about the slightly elevated factor VIII but even if that were found to be persistently elevated, and we do not have that data, it would still be a minor concern (not like for example a lupus anticoagulant).  Accordingly we are recommending rivaroxaban 10 mg daily as detailed above.  Jessica Bautista is very comfortable with this decision.  She wanted me to make sure we communicated with Dr. Maxie Better and I am sending him a letter with the information as well as a copy of this note  I will be glad to see Jessica Bautista again at any point in the future if and when the need arises but as of now are making no further routine appointments for her here.   Saqib Cazarez, Virgie Dad, MD  12/14/20 5:42 PM  Medical Oncology and Hematology [facility] [address] [place], [place] [address] Tel. [phone]    Fax. [phone]   I, Wilburn Mylar, am acting as scribe for Dr. Virgie Dad. Emine Lopata.  I, Lurline Del MD, have reviewed the above documentation for accuracy and completeness, and I agree with the above.   *Total Encounter Time as defined by the Centers for Medicare and Medicaid Services includes, in addition to the face-to-face time of a patient visit (documented in the note above) non-face-to-face time: obtaining and reviewing outside history, ordering and reviewing  medications, tests or procedures, care coordination (communications with other health care professionals or caregivers) and documentation in the medical record.

## 2020-12-29 NOTE — Patient Instructions (Addendum)
DUE TO COVID-19 ONLY ONE VISITOR IS ALLOWED TO COME WITH YOU AND STAY IN THE WAITING ROOM ONLY DURING PRE OP AND PROCEDURE DAY OF SURGERY. THE 1 VISITOR  MAY VISIT WITH YOU AFTER SURGERY IN YOUR PRIVATE ROOM DURING VISITING HOURS ONLY!  YOU NEED TO HAVE A COVID 19 TEST ON__2/26_____ @_10 :20______, THIS TEST MUST BE DONE BEFORE SURGERY,  COVID TESTING SITE [place] [id number], IT IS ON THE RIGHT GOING OUT [address] APPROXIMATELY  2 MINUTES PAST [employer] ON THE RIGHT. ONCE YOUR COVID TEST IS COMPLETED,  PLEASE BEGIN THE QUARANTINE INSTRUCTIONS AS OUTLINED IN YOUR HANDOUT.                Jessica Bautista    Your procedure is scheduled on: 01/06/21   Report to [facility] Main  Entrance   Report to admitting at 6:00 AM     Call this number if you have problems the morning of surgery [place], [place]  .  No food after midnight.    You may have clear liquid until 5:30 AM.    At 5:00 AM drink pre surgery drink.   Nothing by mouth after 5:30 AM.   Take these medicines the morning of surgery with A SIP OF WATER: Levothyroxine                                 You may not have any metal on your body including hair pins and              piercings  Do not wear jewelry, make-up, lotions, powders or perfumes, deodorant             Do not wear nail polish on your fingernails.  Do not shave  48 hours prior to surgery.     Do not bring valuables to the hospital. [place].  Contacts, dentures or bridgework may not be worn into surgery.                   - Preparing for Surgery Before surgery, you can play an important role.  Because skin is not sterile, your skin needs to be as free of germs as possible.  You can reduce the number of germs on your skin by washing with CHG (chlorahexidine  gluconate) soap before surgery.  CHG is an antiseptic cleaner which kills germs and bonds with the skin to continue killing germs even after washing. Please DO NOT use if you have an allergy to CHG or antibacterial soaps.  If your skin becomes reddened/irritated stop using the CHG and inform your nurse when you arrive at Short Stay. Do not shave (including legs and underarms) for at least 48 hours prior to the first CHG shower.. Please follow these instructions carefully:  1.  Shower with CHG Soap the night before surgery and the  morning of Surgery.  2.  If you choose to wash your hair, wash your hair first as usual with your  normal  shampoo.  3.  After you shampoo, rinse your hair and body thoroughly to remove the  shampoo.  4.  Use CHG as you would any other liquid soap.  You can apply chg directly  to the skin and wash                       Gently with a scrungie or clean washcloth.  5.  Apply the CHG Soap to your body ONLY FROM THE NECK DOWN.   Do not use on face/ open                           Wound or open sores. Avoid contact with eyes, ears mouth and genitals (private parts).                       Wash face,  Genitals (private parts) with your normal soap.             6.  Wash thoroughly, paying special attention to the area where your surgery  will be performed.  7.  Thoroughly rinse your body with warm water from the neck down.  8.  DO NOT shower/wash with your normal soap after using and rinsing off  the CHG Soap.             9.  Pat yourself dry with a clean towel.            10.  Wear clean pajamas.            11.  Place clean sheets on your bed the night of your first shower and do not  sleep with pets. Day of Surgery : Do not apply any lotions/deodorants the morning of surgery.  Please wear clean clothes to the hospital/surgery center.  FAILURE TO FOLLOW THESE INSTRUCTIONS MAY RESULT IN THE CANCELLATION OF YOUR SURGERY PATIENT  SIGNATURE_________________________________  NURSE SIGNATURE__________________________________  ________________________________________________________________________   Adam Phenix  An incentive spirometer is a tool that can help keep your lungs clear and active. This tool measures how well you are filling your lungs with each breath. Taking long deep breaths may help reverse or decrease the chance of developing breathing (pulmonary) problems (especially infection) following:  A long period of time when you are unable to move or be active. BEFORE THE PROCEDURE   If the spirometer includes an indicator to show your best effort, your nurse or respiratory therapist will set it to a desired goal.  If possible, sit up straight or lean slightly forward. Try not to slouch.  Hold the incentive spirometer in an upright position. INSTRUCTIONS FOR USE  1. Sit on the edge of your bed if possible, or sit up as far as you can in bed or on a chair. 2. Hold the incentive spirometer in an upright position. 3. Breathe out normally. 4. Place the mouthpiece in your mouth and seal your lips tightly around it. 5. Breathe in slowly and as deeply as possible, raising the piston or the ball toward the top of the column. 6. Hold your breath for 3-5 seconds or for as long as possible. Allow the piston or ball to fall to the bottom of the column. 7. Remove the mouthpiece from your mouth and breathe out normally. 8. Rest for a few seconds and repeat Steps 1 through 7 at least 10 times every 1-2 hours when you are awake. Take your time and take a few normal breaths between deep breaths. 9. The spirometer may include an indicator to show your best effort.  Use the indicator as a goal to work toward during each repetition. 10. After each set of 10 deep breaths, practice coughing to be sure your lungs are clear. If you have an incision (the cut made at the time of surgery), support your incision when coughing  by placing a pillow or rolled up towels firmly against it. Once you are able to get out of bed, walk around indoors and cough well. You may stop using the incentive spirometer when instructed by your caregiver.  RISKS AND COMPLICATIONS  Take your time so you do not get dizzy or light-headed.  If you are in pain, you may need to take or ask for pain medication before doing incentive spirometry. It is harder to take a deep breath if you are having pain. AFTER USE  Rest and breathe slowly and easily.  It can be helpful to keep track of a log of your progress. Your caregiver can provide you with a simple table to help with this. If you are using the spirometer at home, follow these instructions: [place] IF:   You are having difficultly using the spirometer.  You have trouble using the spirometer as often as instructed.  Your pain medication is not giving enough relief while using the spirometer.  You develop fever of 100.5 F (38.1 C) or higher. SEEK IMMEDIATE MEDICAL CARE IF:   You cough up bloody sputum that had not been present before.  You develop fever of 102 F (38.9 C) or greater.  You develop worsening pain at or near the incision site. MAKE SURE YOU:   Understand these instructions.  Will watch your condition.  Will get help right away if you are not doing well or get worse. Document Released: 03/06/2007 Document Revised: 01/16/2012 Document Reviewed: 05/07/2007 [facility] Patient Information 2014 [place], [place].   ________________________________________________________________________

## 2020-12-30 ENCOUNTER — Encounter (HOSPITAL_COMMUNITY)
Admission: RE | Admit: 2020-12-30 | Discharge: 2020-12-30 | Disposition: A | Discharge status: Home / Self Care | Payer: Medicare HMO | Source: Ambulatory Visit | Attending: Specialist | Admitting: Specialist

## 2020-12-30 ENCOUNTER — Encounter (HOSPITAL_COMMUNITY): Payer: Self-pay

## 2020-12-30 ENCOUNTER — Other Ambulatory Visit: Payer: Self-pay

## 2020-12-30 DIAGNOSIS — Z01818 Encounter for other preprocedural examination: Secondary | ICD-10-CM | POA: Insufficient documentation

## 2020-12-30 DIAGNOSIS — I10 Essential (primary) hypertension: Secondary | ICD-10-CM | POA: Insufficient documentation

## 2020-12-30 LAB — BASIC METABOLIC PANEL
Anion gap: 11 (ref 5–15)
BUN: 11 mg/dL (ref 8–23)
CO2: 24 mmol/L (ref 22–32)
Calcium: 9.3 mg/dL (ref 8.9–10.3)
Chloride: 106 mmol/L (ref 98–111)
Creatinine, Ser: 0.68 mg/dL (ref 0.44–1.00)
GFR, Estimated: >60 mL/min (ref >60)
Glucose, Bld: 97 mg/dL (ref 70–99)
Potassium: 4.2 mmol/L (ref 3.5–5.1)
Sodium: 141 mmol/L (ref 135–145)

## 2020-12-30 LAB — URINALYSIS, ROUTINE W REFLEX MICROSCOPIC
Bilirubin Urine: NEGATIVE
Glucose, UA: NEGATIVE mg/dL
Hgb urine dipstick: NEGATIVE
Ketones, ur: 20 mg/dL — AB
Leukocytes,Ua: NEGATIVE
Nitrite: NEGATIVE
Protein, ur: NEGATIVE mg/dL
Specific Gravity, Urine: 1.026 (ref 1.005–1.030)
pH: 5 (ref 5.0–8.0)

## 2020-12-30 LAB — CBC
HCT: 38.2 % (ref 36.0–46.0)
Hemoglobin: 12.1 g/dL (ref 12.0–15.0)
MCH: 31 pg (ref 26.0–34.0)
MCHC: 31.7 g/dL (ref 30.0–36.0)
MCV: 97.9 fL (ref 80.0–100.0)
Platelets: 294 10*3/uL (ref 150–400)
RBC: 3.9 MIL/uL (ref 3.87–5.11)
RDW: 13 % (ref 11.5–15.5)
WBC: 7.8 10*3/uL (ref 4.0–10.5)
nRBC: 0 % (ref 0.0–0.2)

## 2020-12-30 LAB — SURGICAL PCR SCREEN
MRSA, PCR: NEGATIVE
Staphylococcus aureus: NEGATIVE

## 2020-12-30 LAB — PROTIME-INR
INR: 1 (ref 0.8–1.2)
Prothrombin Time: 13 seconds (ref 11.4–15.2)

## 2020-12-30 LAB — APTT: aPTT: 30 seconds (ref 24–36)

## 2020-12-30 NOTE — Progress Notes (Signed)
COVID Vaccine Completed:Yes Date COVID Vaccine completed:12/17/19-booster 10/21 COVID vaccine manufacturer:   Moderna      PCP - Dr. Lawernce Pitts Cardiologist - no  Chest x-ray - no EKG - 12/30/20-chart and epic Stress Test -  ECHO - no Cardiac Cath - no Pacemaker/ICD device last checked:NA  Sleep Study - no CPAP -   Fasting Blood Sugar - NA Checks Blood Sugar _____ times a day  Blood Thinner Instructions:NA Aspirin Instructions: Last Dose:  Anesthesia review:   Patient denies shortness of breath, fever, cough and chest pain at PAT appointment yes  Patient verbalized understanding of instructions that were given to them at the PAT appointment. Patient was also instructed that they will need to review over the PAT instructions again at home before surgery.yes Pt can climb 2 flights of stairs, do housework and ADLs without SOB. She has An IVC filter due to a PE following an abdominoplasty.

## 2020-12-31 ENCOUNTER — Ambulatory Visit: Payer: Self-pay | Admitting: Orthopedic Surgery

## 2020-12-31 NOTE — H&P (Signed)
Jessica Bautista is an 80 y.o. female.   Chief Complaint: left knee pain HPI: Jessica Bautista is here today for her History & Physical. She is scheduled for a left total knee arthroplasty on January 06, 2021 at [facility] by Dr. Susa Day.  She reports ongoing left knee pain and instability interfering with quality-of-life and activities of daily living at this point, and she desires to proceed with surgery  Dr. Tonita Cong and the patient mutually agreed to proceed with a total knee replacement. Risks and benefits of the procedure were discussed including stiffness, suboptimal range of motion, persistent pain, infection requiring removal of prosthesis and reinsertion, need for prophylactic antibiotics in the future, for example, dental procedures, possible need for manipulation, revision in the future and also anesthetic complications including DVT, PE, etc. We discussed the perioperative course, time in the hospital, postoperative recovery and the need for elevation to control swelling. We also discussed the predicted range of motion and the probability that squatting and kneeling would be unobtainable in the future. In addition, postoperative anticoagulation was discussed. We have obtained preoperative medical clearance as necessary. Provided illustrated handout and discussed it in detail. They will enroll in the total joint replacement educational forum at the hospital.  She does have a prior history of DVT and PE, has a filter in place. She has been cleared by PCP and vascular and they have recommended Xarelto for her for prophylaxis postoperatively.  Past Medical History:  Diagnosis Date   Atypical nevi    Basal cell carcinoma    Breast cancer ([place])    DVT (deep venous thrombosis) (HCC)    Eczema    Family history of colonic polyps    Fatty (change of) liver, not elsewhere classified    Hyperlipidemia    Hypertension    Hypothyroidism    Impaired fasting glucose    Insomnia     Lichen sclerosus et atrophicus of the vulva    Major depression, single episode    Nontoxic single thyroid nodule    Osteopenia    Psoriasis    Pulmonary embolus ([place]) 1999   post op tummy tuc   Skin cancer    Thyroid disease    Vitamin D deficiency, unspecified    Wears glasses     Past Surgical History:  Procedure Laterality Date   ABDOMINOPLASTY  [place]   right   BREAST LUMPECTOMY Right 01/27/2014   malignant   BREAST LUMPECTOMY WITH NEEDLE LOCALIZATION Right 01/27/2014   Procedure: BREAST LUMPECTOMY WITH NEEDLE LOCALIZATION;  Surgeon: Edward Jolly, MD;  Location: [facility];  Service: General;  Laterality: Right;   BREAST SURGERY     COLONOSCOPY     EYE SURGERY Bilateral 2012   [place]   right    Family History  Problem Relation Age of Onset   Alzheimer's disease Mother    Colon polyps Mother    Heart attack Father 73   Cancer Brother        LUNG- SMOKER    Healthy Sister    Heart attack Brother    Dementia Brother    Healthy Sister    Social History:  reports that she quit smoking about 37 years ago. Her smoking use included cigarettes. She has a 15.00 pack-year smoking history. She has never used smokeless tobacco. She reports current alcohol use of about 3.0 standard drinks of  alcohol per week. She reports that she does not use drugs.  Allergies:  Allergies  Allergen Reactions   Tape Itching and Rash    Adhesive Tape   Wound Dressing Adhesive Itching   Hrt Support [A-G Pro]     History of pe and dvt   Medications: anastrozole 1 mg tablet citalopram 20 mg tablet clobetasoL 0.05 % scalp solution clobetasoL 0.05 % topical ointment doxycycline hyclate 100 mg capsule escitalopram 20 mg tablet levothyroxine 88 mcg tablet predniSONE 5 mg tablets in a dose pack predniSONE 50 mg tablet rosuvastatin 5 mg tablet Zanfel topical  cleanser zolpidem 10 mg tablet   Results for orders placed or performed during the hospital encounter of 12/30/20 (from the past 48 hour(s))  CBC     Status: None   Collection Time: 12/30/20  1:50 PM  Result Value Ref Range   WBC 7.8 4.0 - 10.5 K/uL   RBC 3.90 3.87 - 5.11 MIL/uL   Hemoglobin 12.1 12.0 - 15.0 g/dL   HCT 38.2 36.0 - 46.0 %   MCV 97.9 80.0 - 100.0 fL   MCH 31.0 26.0 - 34.0 pg   MCHC 31.7 30.0 - 36.0 g/dL   RDW 13.0 11.5 - 15.5 %   Platelets 294 150 - 400 K/uL   nRBC 0.0 0.0 - 0.2 %    Comment: Performed at [facility], [place] [address], [place], [place] [address]  Basic metabolic panel     Status: None   Collection Time: 12/30/20  1:50 PM  Result Value Ref Range   Sodium 141 135 - 145 mmol/L   Potassium 4.2 3.5 - 5.1 mmol/L   Chloride 106 98 - 111 mmol/L   CO2 24 22 - 32 mmol/L   Glucose, Bld 97 70 - 99 mg/dL    Comment: Glucose reference range applies only to samples taken after fasting for at least 8 hours.   BUN 11 8 - 23 mg/dL   Creatinine, Ser 0.68 0.44 - 1.00 mg/dL   Calcium 9.3 8.9 - 10.3 mg/dL   GFR, Estimated >60 >60 mL/min    Comment: (NOTE) Calculated using the CKD-EPI Creatinine Equation (2021)    Anion gap 11 5 - 15    Comment: Performed at [facility], [place] [address] [place], [place] [address]  Protime-INR     Status: None   Collection Time: 12/30/20  1:50 PM  Result Value Ref Range   Prothrombin Time 13.0 11.4 - 15.2 seconds   INR 1.0 0.8 - 1.2    Comment: (NOTE) INR goal varies based on device and disease states. Performed at [facility], [place] [address] [place], [place] [address]   APTT     Status: None   Collection Time: 12/30/20  1:50 PM  Result Value Ref Range   aPTT 30 24 - 36 seconds    Comment: Performed at [facility], [place] [address], [place], [place] [address]  Urinalysis, Routine w reflex microscopic     Status: Abnormal   Collection Time:  12/30/20  1:50 PM  Result Value Ref Range   Color, Urine YELLOW YELLOW   APPearance CLEAR CLEAR   Specific Gravity, Urine 1.026 1.005 - 1.030   pH 5.0 5.0 - 8.0   Glucose, UA NEGATIVE NEGATIVE mg/dL   Hgb urine dipstick NEGATIVE NEGATIVE   Bilirubin Urine NEGATIVE NEGATIVE   Ketones, ur 20 (A) NEGATIVE mg/dL   Protein, ur NEGATIVE NEGATIVE mg/dL   Nitrite  NEGATIVE NEGATIVE   Leukocytes,Ua NEGATIVE NEGATIVE    Comment: Performed at [place] [address], [place], [place] [address]  Surgical pcr screen     Status: None   Collection Time: 12/30/20  1:50 PM   Specimen: Nasal Mucosa; Nasal Swab  Result Value Ref Range   MRSA, PCR NEGATIVE NEGATIVE   Staphylococcus aureus NEGATIVE NEGATIVE    Comment: (NOTE) The Xpert SA Assay (FDA approved for NASAL specimens in patients 37 years of age and older), is one component of a comprehensive surveillance program. It is not intended to diagnose infection nor to guide or monitor treatment. Performed at [facility], [place] [address], [place], [place] [address]    No results found.  Review of Systems  Constitutional: Negative.   HENT: Negative.   Eyes: Negative.   Respiratory: Negative.   Cardiovascular: Negative.   Gastrointestinal: Negative.   Endocrine: Negative.   Genitourinary: Negative.   Musculoskeletal: Positive for arthralgias and joint swelling.  Skin: Negative.   Neurological: Negative.     There were no vitals taken for this visit. Physical Exam Constitutional:      Appearance: Normal appearance.  HENT:     Head: Normocephalic and atraumatic.     Right Ear: External ear normal.     Left Ear: External ear normal.     Nose: Nose normal.     Mouth/Throat:     Pharynx: Oropharynx is clear.  Eyes:     Conjunctiva/sclera: Conjunctivae normal.  Cardiovascular:     Rate and Rhythm: Normal rate and regular rhythm.     Pulses: Normal pulses.     Heart sounds: Normal heart  sounds.  Pulmonary:     Effort: Pulmonary effort is normal.     Breath sounds: Normal breath sounds.  Abdominal:     General: Bowel sounds are normal.  Musculoskeletal:     Cervical back: Normal range of motion.     Comments: Patient is an 80 year old female.  Tender medial joint line. Patellofemoral pain compression. Trace effusion. Ranges 0-100. No DVT. Ipsilateral hip and ankle exams unremarkable.   Skin:    General: Skin is warm and dry.  Neurological:     Mental Status: She is alert.     X-rays with end-stage osteoarthritis left knee.  Assessment/Plan Impression: Left knee end-stage osteoarthritis  Plan: Pt with end-stage left knee DJD, bone-on-bone, refractory to conservative tx, scheduled for left total knee replacement by Dr. Tonita Cong on March 2. We again discussed the procedure itself as well as risks, complications and alternatives, including but not limited to DVT, PE, infx, bleeding, failure of procedure, need for secondary procedure including manipulation, nerve injury, ongoing pain/symptoms, anesthesia risk, even stroke or death. Also discussed typical post-op protocols, activity restrictions, need for PT, flexion/extension exercises, time out of work. Discussed need for DVT ppx post-op per protocol. Discussed dental ppx and infx prevention. Also discussed limitations post-operatively such as kneeling and squatting. All questions were answered. Patient desires to proceed with surgery as scheduled.  Will hold supplements, ASA and NSAIDs accordingly. Will remain NPO after midnight the night before surgery. Will present to [facility] for pre-op testing. Anticipate hospital stay to include at least 2 midnights given medical history and to ensure proper pain control. Plan Xarelto for DVT ppx post-op. Plan Percocet, Colace, Miralax. Plan discharge to home postoperatively, outpatient PT has been scheduled. Will follow up 10-14 days post-op for suture removal and xrays.    Plan left total knee  replacement  Cecilie Kicks, PA-C for Dr. Tonita Cong 12/31/2020, 12:37 PM

## 2020-12-31 NOTE — H&P (View-Only) (Signed)
Jessica Bautista is an 80 y.o. female.   Chief Complaint: left knee pain HPI: Jessica Bautista is here today for her History & Physical. She is scheduled for a left total knee arthroplasty on January 06, 2021 at [facility] by Dr. Susa Day.  She reports ongoing left knee pain and instability interfering with quality-of-life and activities of daily living at this point, and she desires to proceed with surgery  Dr. Tonita Cong and the patient mutually agreed to proceed with a total knee replacement. Risks and benefits of the procedure were discussed including stiffness, suboptimal range of motion, persistent pain, infection requiring removal of prosthesis and reinsertion, need for prophylactic antibiotics in the future, for example, dental procedures, possible need for manipulation, revision in the future and also anesthetic complications including DVT, PE, etc. We discussed the perioperative course, time in the hospital, postoperative recovery and the need for elevation to control swelling. We also discussed the predicted range of motion and the probability that squatting and kneeling would be unobtainable in the future. In addition, postoperative anticoagulation was discussed. We have obtained preoperative medical clearance as necessary. Provided illustrated handout and discussed it in detail. They will enroll in the total joint replacement educational forum at the hospital.  She does have a prior history of DVT and PE, has a filter in place. She has been cleared by PCP and vascular and they have recommended Xarelto for her for prophylaxis postoperatively.  Past Medical History:  Diagnosis Date  . Atypical nevi   . Basal cell carcinoma   . Breast cancer ([place])   . DVT (deep venous thrombosis) ([place])   . Eczema   . Family history of colonic polyps   . Fatty (change of) liver, not elsewhere classified   . Hyperlipidemia   . Hypertension   . Hypothyroidism   . Impaired fasting glucose   . Insomnia    . Lichen sclerosus et atrophicus of the vulva   . Major depression, single episode   . Nontoxic single thyroid nodule   . Osteopenia   . Psoriasis   . Pulmonary embolus ([place]) 1999   post op tummy tuc  . Skin cancer   . Thyroid disease   . Vitamin D deficiency, unspecified   . Wears glasses     Past Surgical History:  Procedure Laterality Date  . ABDOMINOPLASTY  1998  . [place]   right  . BREAST LUMPECTOMY Right 01/27/2014   malignant  . BREAST LUMPECTOMY WITH NEEDLE LOCALIZATION Right 01/27/2014   Procedure: BREAST LUMPECTOMY WITH NEEDLE LOCALIZATION;  Surgeon: Edward Jolly, MD;  Location: [facility];  Service: General;  Laterality: Right;  . BREAST SURGERY    . COLONOSCOPY    . EYE SURGERY Bilateral 2012  . TUBAL LIGATION    . WRIST FRACTURE SURGERY  1975   right    Family History  Problem Relation Age of Onset  . Alzheimer's disease Mother   . Colon polyps Mother   . Heart attack Father 46  . Cancer Brother        LUNG- SMOKER   . Healthy Sister   . Heart attack Brother   . Dementia Brother   . Healthy Sister    Social History:  reports that she quit smoking about 37 years ago. Her smoking use included cigarettes. She has a 15.00 pack-year smoking history. She has never used smokeless tobacco. She reports current alcohol use of about 3.0 standard drinks of  alcohol per week. She reports that she does not use drugs.  Allergies:  Allergies  Allergen Reactions  . Tape Itching and Rash    Adhesive Tape  . Wound Dressing Adhesive Itching  . Hrt Support [A-G Pro]     History of pe and dvt   Medications: anastrozole 1 mg tablet citalopram 20 mg tablet clobetasoL 0.05 % scalp solution clobetasoL 0.05 % topical ointment doxycycline hyclate 100 mg capsule escitalopram 20 mg tablet levothyroxine 88 mcg tablet predniSONE 5 mg tablets in a dose pack predniSONE 50 mg tablet rosuvastatin 5 mg tablet Zanfel topical  cleanser zolpidem 10 mg tablet   Results for orders placed or performed during the hospital encounter of 12/30/20 (from the past 48 hour(s))  CBC     Status: None   Collection Time: 12/30/20  1:50 PM  Result Value Ref Range   WBC 7.8 4.0 - 10.5 K/uL   RBC 3.90 3.87 - 5.11 MIL/uL   Hemoglobin 12.1 12.0 - 15.0 g/dL   HCT 38.2 36.0 - 46.0 %   MCV 97.9 80.0 - 100.0 fL   MCH 31.0 26.0 - 34.0 pg   MCHC 31.7 30.0 - 36.0 g/dL   RDW 13.0 11.5 - 15.5 %   Platelets 294 150 - 400 K/uL   nRBC 0.0 0.0 - 0.2 %    Comment: Performed at [facility], [place] [address] [place], [place] [address]  Basic metabolic panel     Status: None   Collection Time: 12/30/20  1:50 PM  Result Value Ref Range   Sodium 141 135 - 145 mmol/L   Potassium 4.2 3.5 - 5.1 mmol/L   Chloride 106 98 - 111 mmol/L   CO2 24 22 - 32 mmol/L   Glucose, Bld 97 70 - 99 mg/dL    Comment: Glucose reference range applies only to samples taken after fasting for at least 8 hours.   BUN 11 8 - 23 mg/dL   Creatinine, Ser 0.68 0.44 - 1.00 mg/dL   Calcium 9.3 8.9 - 10.3 mg/dL   GFR, Estimated >60 >60 mL/min    Comment: (NOTE) Calculated using the CKD-EPI Creatinine Equation (2021)    Anion gap 11 5 - 15    Comment: Performed at [facility], [place] [address] [place], [place] [address]  Protime-INR     Status: None   Collection Time: 12/30/20  1:50 PM  Result Value Ref Range   Prothrombin Time 13.0 11.4 - 15.2 seconds   INR 1.0 0.8 - 1.2    Comment: (NOTE) INR goal varies based on device and disease states. Performed at [facility], [place] [address] [place], [place] [address]   APTT     Status: None   Collection Time: 12/30/20  1:50 PM  Result Value Ref Range   aPTT 30 24 - 36 seconds    Comment: Performed at [facility], [place] [address] [place], [place] [address]  Urinalysis, Routine w reflex microscopic     Status: Abnormal   Collection Time:  12/30/20  1:50 PM  Result Value Ref Range   Color, Urine YELLOW YELLOW   APPearance CLEAR CLEAR   Specific Gravity, Urine 1.026 1.005 - 1.030   pH 5.0 5.0 - 8.0   Glucose, UA NEGATIVE NEGATIVE mg/dL   Hgb urine dipstick NEGATIVE NEGATIVE   Bilirubin Urine NEGATIVE NEGATIVE   Ketones, ur 20 (A) NEGATIVE mg/dL   Protein, ur NEGATIVE NEGATIVE mg/dL   Nitrite  NEGATIVE NEGATIVE   Leukocytes,Ua NEGATIVE NEGATIVE    Comment: Performed at [place] [address] [place], [place] [address]  Surgical pcr screen     Status: None   Collection Time: 12/30/20  1:50 PM   Specimen: Nasal Mucosa; Nasal Swab  Result Value Ref Range   MRSA, PCR NEGATIVE NEGATIVE   Staphylococcus aureus NEGATIVE NEGATIVE    Comment: (NOTE) The Xpert SA Assay (FDA approved for NASAL specimens in patients 59 years of age and older), is one component of a comprehensive surveillance program. It is not intended to diagnose infection nor to guide or monitor treatment. Performed at [facility], [place] [address], [place], [place] [address]    No results found.  Review of Systems  Constitutional: Negative.   HENT: Negative.   Eyes: Negative.   Respiratory: Negative.   Cardiovascular: Negative.   Gastrointestinal: Negative.   Endocrine: Negative.   Genitourinary: Negative.   Musculoskeletal: Positive for arthralgias and joint swelling.  Skin: Negative.   Neurological: Negative.     There were no vitals taken for this visit. Physical Exam Constitutional:      Appearance: Normal appearance.  HENT:     Head: Normocephalic and atraumatic.     Right Ear: External ear normal.     Left Ear: External ear normal.     Nose: Nose normal.     Mouth/Throat:     Pharynx: Oropharynx is clear.  Eyes:     Conjunctiva/sclera: Conjunctivae normal.  Cardiovascular:     Rate and Rhythm: Normal rate and regular rhythm.     Pulses: Normal pulses.     Heart sounds: Normal heart  sounds.  Pulmonary:     Effort: Pulmonary effort is normal.     Breath sounds: Normal breath sounds.  Abdominal:     General: Bowel sounds are normal.  Musculoskeletal:     Cervical back: Normal range of motion.     Comments: Patient is an 80 year old female.  Tender medial joint Bautista. Patellofemoral pain compression. Trace effusion. Ranges 0-100. No DVT. Ipsilateral hip and ankle exams unremarkable.   Skin:    General: Skin is warm and dry.  Neurological:     Mental Status: She is alert.     X-rays with end-stage osteoarthritis left knee.  Assessment/Plan Impression: Left knee end-stage osteoarthritis  Plan: Pt with end-stage left knee DJD, bone-on-bone, refractory to conservative tx, scheduled for left total knee replacement by Dr. Tonita Cong on March 2. We again discussed the procedure itself as well as risks, complications and alternatives, including but not limited to DVT, PE, infx, bleeding, failure of procedure, need for secondary procedure including manipulation, nerve injury, ongoing pain/symptoms, anesthesia risk, even stroke or death. Also discussed typical post-op protocols, activity restrictions, need for PT, flexion/extension exercises, time out of work. Discussed need for DVT ppx post-op per protocol. Discussed dental ppx and infx prevention. Also discussed limitations post-operatively such as kneeling and squatting. All questions were answered. Patient desires to proceed with surgery as scheduled.  Will hold supplements, ASA and NSAIDs accordingly. Will remain NPO after midnight the night before surgery. Will present to [facility] for pre-op testing. Anticipate hospital stay to include at least 2 midnights given medical history and to ensure proper pain control. Plan Xarelto for DVT ppx post-op. Plan Percocet, Colace, Miralax. Plan discharge to home postoperatively, outpatient PT has been scheduled. Will follow up 10-14 days post-op for suture removal and xrays.    Plan left total knee  replacement  Cecilie Kicks, PA-C for Dr. Tonita Cong 12/31/2020, 12:37 PM

## 2021-01-04 ENCOUNTER — Other Ambulatory Visit (HOSPITAL_COMMUNITY)
Admission: RE | Admit: 2021-01-04 | Discharge: 2021-01-04 | Disposition: A | Discharge status: Home / Self Care | Payer: Medicare HMO | Source: Ambulatory Visit | Attending: Specialist | Admitting: Specialist

## 2021-01-04 DIAGNOSIS — Z01812 Encounter for preprocedural laboratory examination: Secondary | ICD-10-CM | POA: Insufficient documentation

## 2021-01-04 DIAGNOSIS — Z20822 Contact with and (suspected) exposure to covid-19: Secondary | ICD-10-CM | POA: Insufficient documentation

## 2021-01-04 LAB — SARS CORONAVIRUS 2 (TAT 6-24 HRS): SARS Coronavirus 2: NEGATIVE

## 2021-01-05 MED ORDER — TRANEXAMIC ACID 1000 MG/10ML IV SOLN
2000.0000 mg | INTRAVENOUS | Status: DC
  Filled 2021-01-05: qty 20

## 2021-01-06 ENCOUNTER — Inpatient Hospital Stay (HOSPITAL_COMMUNITY): Payer: Medicare HMO | Admitting: Anesthesiology

## 2021-01-06 ENCOUNTER — Inpatient Hospital Stay (HOSPITAL_COMMUNITY)
Admission: RE | Admit: 2021-01-06 | Discharge: 2021-01-09 | DRG: 470 | Disposition: A | Discharge status: Home / Self Care | Payer: Medicare HMO | Attending: Specialist | Admitting: Specialist

## 2021-01-06 ENCOUNTER — Encounter (HOSPITAL_COMMUNITY)
Admission: RE | Disposition: A | Discharge status: Home / Self Care | Payer: Self-pay | Source: Home / Self Care | Attending: Specialist

## 2021-01-06 ENCOUNTER — Encounter (HOSPITAL_COMMUNITY): Payer: Self-pay | Admitting: Specialist

## 2021-01-06 ENCOUNTER — Inpatient Hospital Stay (HOSPITAL_COMMUNITY): Payer: Medicare HMO

## 2021-01-06 DIAGNOSIS — E785 Hyperlipidemia, unspecified: Secondary | ICD-10-CM | POA: Diagnosis present

## 2021-01-06 DIAGNOSIS — Z96659 Presence of unspecified artificial knee joint: Secondary | ICD-10-CM

## 2021-01-06 DIAGNOSIS — Z96652 Presence of left artificial knee joint: Secondary | ICD-10-CM | POA: Diagnosis not present

## 2021-01-06 DIAGNOSIS — Z20822 Contact with and (suspected) exposure to covid-19: Secondary | ICD-10-CM | POA: Diagnosis not present

## 2021-01-06 DIAGNOSIS — Z853 Personal history of malignant neoplasm of breast: Secondary | ICD-10-CM | POA: Diagnosis not present

## 2021-01-06 DIAGNOSIS — L409 Psoriasis, unspecified: Secondary | ICD-10-CM | POA: Diagnosis present

## 2021-01-06 DIAGNOSIS — M1712 Unilateral primary osteoarthritis, left knee: Secondary | ICD-10-CM | POA: Diagnosis not present

## 2021-01-06 DIAGNOSIS — Z86718 Personal history of other venous thrombosis and embolism: Secondary | ICD-10-CM

## 2021-01-06 DIAGNOSIS — Z86711 Personal history of pulmonary embolism: Secondary | ICD-10-CM

## 2021-01-06 DIAGNOSIS — Z7952 Long term (current) use of systemic steroids: Secondary | ICD-10-CM | POA: Diagnosis not present

## 2021-01-06 DIAGNOSIS — Z87891 Personal history of nicotine dependence: Secondary | ICD-10-CM

## 2021-01-06 DIAGNOSIS — M8580 Other specified disorders of bone density and structure, unspecified site: Secondary | ICD-10-CM | POA: Diagnosis present

## 2021-01-06 DIAGNOSIS — Z91048 Other nonmedicinal substance allergy status: Secondary | ICD-10-CM

## 2021-01-06 DIAGNOSIS — I10 Essential (primary) hypertension: Secondary | ICD-10-CM | POA: Diagnosis not present

## 2021-01-06 DIAGNOSIS — M25762 Osteophyte, left knee: Secondary | ICD-10-CM | POA: Diagnosis present

## 2021-01-06 DIAGNOSIS — Z9851 Tubal ligation status: Secondary | ICD-10-CM | POA: Diagnosis not present

## 2021-01-06 DIAGNOSIS — Z79890 Hormone replacement therapy (postmenopausal): Secondary | ICD-10-CM | POA: Diagnosis not present

## 2021-01-06 DIAGNOSIS — Z8249 Family history of ischemic heart disease and other diseases of the circulatory system: Secondary | ICD-10-CM | POA: Diagnosis not present

## 2021-01-06 DIAGNOSIS — M21162 Varus deformity, not elsewhere classified, left knee: Secondary | ICD-10-CM | POA: Diagnosis present

## 2021-01-06 DIAGNOSIS — K760 Fatty (change of) liver, not elsewhere classified: Secondary | ICD-10-CM | POA: Diagnosis not present

## 2021-01-06 DIAGNOSIS — E559 Vitamin D deficiency, unspecified: Secondary | ICD-10-CM | POA: Diagnosis present

## 2021-01-06 DIAGNOSIS — E039 Hypothyroidism, unspecified: Secondary | ICD-10-CM | POA: Diagnosis present

## 2021-01-06 DIAGNOSIS — Z471 Aftercare following joint replacement surgery: Secondary | ICD-10-CM | POA: Diagnosis not present

## 2021-01-06 DIAGNOSIS — Z79899 Other long term (current) drug therapy: Secondary | ICD-10-CM | POA: Diagnosis not present

## 2021-01-06 DIAGNOSIS — G8918 Other acute postprocedural pain: Secondary | ICD-10-CM | POA: Diagnosis not present

## 2021-01-06 DIAGNOSIS — Z85828 Personal history of other malignant neoplasm of skin: Secondary | ICD-10-CM | POA: Diagnosis not present

## 2021-01-06 DIAGNOSIS — Z96642 Presence of left artificial hip joint: Secondary | ICD-10-CM | POA: Diagnosis not present

## 2021-01-06 HISTORY — PX: TOTAL KNEE ARTHROPLASTY: SHX125

## 2021-01-06 SURGERY — ARTHROPLASTY, KNEE, TOTAL
Anesthesia: Regional | Site: Knee | Laterality: Left

## 2021-01-06 MED ORDER — MIDAZOLAM HCL 2 MG/2ML IJ SOLN
INTRAMUSCULAR | Status: AC
  Filled 2021-01-06: qty 2

## 2021-01-06 MED ORDER — BISACODYL 5 MG PO TBEC: 5.0000 mg | DELAYED_RELEASE_TABLET | Freq: Every day | ORAL | Status: DC | PRN

## 2021-01-06 MED ORDER — LEVOTHYROXINE SODIUM 88 MCG PO TABS
88.0000 ug | ORAL_TABLET | Freq: Every day | ORAL | Status: DC
  Administered 2021-01-07 – 2021-01-09 (×3): 88 ug via ORAL
  Filled 2021-01-06 (×3): qty 1

## 2021-01-06 MED ORDER — PHENOL 1.4 % MT LIQD: 1.0000 | OROMUCOSAL | Status: DC | PRN

## 2021-01-06 MED ORDER — DOCUSATE SODIUM 100 MG PO CAPS: 100.0000 mg | ORAL_CAPSULE | Freq: Two times a day (BID) | ORAL | 1 refills | Status: AC | PRN

## 2021-01-06 MED ORDER — PROPOFOL 500 MG/50ML IV EMUL
INTRAVENOUS | Status: DC | PRN
  Administered 2021-01-06: 50 ug/kg/min via INTRAVENOUS

## 2021-01-06 MED ORDER — MENTHOL 3 MG MT LOZG: 1.0000 | LOZENGE | OROMUCOSAL | Status: DC | PRN

## 2021-01-06 MED ORDER — BUPIVACAINE LIPOSOME 1.3 % IJ SUSP
10.0000 mL | Freq: Once | INTRAMUSCULAR | Status: AC
  Administered 2021-01-06: 10 mL
  Filled 2021-01-06: qty 10

## 2021-01-06 MED ORDER — ONDANSETRON HCL 4 MG/2ML IJ SOLN: 4.0000 mg | Freq: Once | INTRAMUSCULAR | Status: DC | PRN

## 2021-01-06 MED ORDER — SODIUM CHLORIDE 0.9 % IR SOLN
Status: DC | PRN
  Administered 2021-01-06: 2000 mL

## 2021-01-06 MED ORDER — METHOCARBAMOL 500 MG PO TABS
500.0000 mg | ORAL_TABLET | Freq: Four times a day (QID) | ORAL | Status: DC | PRN
  Administered 2021-01-06 – 2021-01-08 (×2): 500 mg via ORAL
  Filled 2021-01-06 (×2): qty 1

## 2021-01-06 MED ORDER — ZOLPIDEM TARTRATE 5 MG PO TABS
5.0000 mg | ORAL_TABLET | Freq: Every evening | ORAL | Status: DC | PRN
  Administered 2021-01-06: 5 mg via ORAL
  Administered 2021-01-07: 2.5 mg via ORAL
  Filled 2021-01-06 (×2): qty 1

## 2021-01-06 MED ORDER — ONDANSETRON HCL 4 MG PO TABS: 4.0000 mg | ORAL_TABLET | Freq: Four times a day (QID) | ORAL | Status: DC | PRN

## 2021-01-06 MED ORDER — AMISULPRIDE (ANTIEMETIC) 5 MG/2ML IV SOLN: 10.0000 mg | Freq: Once | INTRAVENOUS | Status: DC | PRN

## 2021-01-06 MED ORDER — DEXAMETHASONE SODIUM PHOSPHATE 10 MG/ML IJ SOLN
INTRAMUSCULAR | Status: DC | PRN
  Administered 2021-01-06: 8 mg via INTRAVENOUS

## 2021-01-06 MED ORDER — ALUM & MAG HYDROXIDE-SIMETH 200-200-20 MG/5ML PO SUSP: 30.0000 mL | ORAL | Status: DC | PRN

## 2021-01-06 MED ORDER — RIVAROXABAN 10 MG PO TABS: 10.0000 mg | ORAL_TABLET | Freq: Every day | ORAL | 0 refills | Status: DC

## 2021-01-06 MED ORDER — OXYCODONE HCL 5 MG PO TABS: 5.0000 mg | ORAL_TABLET | Freq: Once | ORAL | Status: DC | PRN

## 2021-01-06 MED ORDER — DEXAMETHASONE SODIUM PHOSPHATE 10 MG/ML IJ SOLN
INTRAMUSCULAR | Status: AC
  Filled 2021-01-06: qty 1

## 2021-01-06 MED ORDER — LACTATED RINGERS IV SOLN: INTRAVENOUS | Status: DC

## 2021-01-06 MED ORDER — ORAL CARE MOUTH RINSE: 15.0000 mL | Freq: Once | OROMUCOSAL | Status: AC

## 2021-01-06 MED ORDER — BUPIVACAINE-EPINEPHRINE 0.25% -1:200000 IJ SOLN
INTRAMUSCULAR | Status: DC | PRN
  Administered 2021-01-06: 30 mL

## 2021-01-06 MED ORDER — KCL IN DEXTROSE-NACL 20-5-0.45 MEQ/L-%-% IV SOLN
INTRAVENOUS | Status: AC
  Filled 2021-01-06: qty 1000

## 2021-01-06 MED ORDER — ACETAMINOPHEN 500 MG PO TABS
500.0000 mg | ORAL_TABLET | Freq: Four times a day (QID) | ORAL | Status: AC
  Administered 2021-01-07 (×2): 500 mg via ORAL
  Filled 2021-01-06 (×2): qty 1

## 2021-01-06 MED ORDER — POLYETHYLENE GLYCOL 3350 17 G PO PACK: 17.0000 g | PACK | Freq: Every day | ORAL | Status: DC | PRN

## 2021-01-06 MED ORDER — METHOCARBAMOL 500 MG IVPB - SIMPLE MED
500.0000 mg | Freq: Four times a day (QID) | INTRAVENOUS | Status: DC | PRN
  Filled 2021-01-06: qty 50

## 2021-01-06 MED ORDER — LORATADINE 10 MG PO TABS
10.0000 mg | ORAL_TABLET | Freq: Every day | ORAL | Status: DC
  Administered 2021-01-07 – 2021-01-09 (×3): 10 mg via ORAL
  Filled 2021-01-06 (×3): qty 1

## 2021-01-06 MED ORDER — MAGNESIUM CITRATE PO SOLN: 1.0000 | Freq: Once | ORAL | Status: DC | PRN

## 2021-01-06 MED ORDER — PROPOFOL 10 MG/ML IV BOLUS
INTRAVENOUS | Status: AC
  Filled 2021-01-06: qty 20

## 2021-01-06 MED ORDER — BUPIVACAINE HCL (PF) 0.5 % IJ SOLN
INTRAMUSCULAR | Status: DC | PRN
  Administered 2021-01-06: 20 mL via PERINEURAL

## 2021-01-06 MED ORDER — ACETAMINOPHEN 10 MG/ML IV SOLN
1000.0000 mg | INTRAVENOUS | Status: AC
  Administered 2021-01-06: 1000 mg via INTRAVENOUS
  Filled 2021-01-06: qty 100

## 2021-01-06 MED ORDER — TRANEXAMIC ACID-NACL 1000-0.7 MG/100ML-% IV SOLN
INTRAVENOUS | Status: AC
  Filled 2021-01-06: qty 100

## 2021-01-06 MED ORDER — PROPOFOL 500 MG/50ML IV EMUL
INTRAVENOUS | Status: AC
  Filled 2021-01-06: qty 50

## 2021-01-06 MED ORDER — RISAQUAD PO CAPS
1.0000 | ORAL_CAPSULE | Freq: Every day | ORAL | Status: DC
  Administered 2021-01-07 – 2021-01-09 (×3): 1 via ORAL
  Filled 2021-01-06 (×3): qty 1

## 2021-01-06 MED ORDER — 0.9 % SODIUM CHLORIDE (POUR BTL) OPTIME
TOPICAL | Status: DC | PRN
  Administered 2021-01-06: 1000 mL

## 2021-01-06 MED ORDER — CEFAZOLIN SODIUM-DEXTROSE 2-4 GM/100ML-% IV SOLN
2.0000 g | INTRAVENOUS | Status: AC
  Administered 2021-01-06: 2 g via INTRAVENOUS
  Filled 2021-01-06: qty 100

## 2021-01-06 MED ORDER — RIVAROXABAN 10 MG PO TABS
10.0000 mg | ORAL_TABLET | Freq: Every day | ORAL | Status: DC
  Administered 2021-01-07 – 2021-01-09 (×3): 10 mg via ORAL
  Filled 2021-01-06 (×3): qty 1

## 2021-01-06 MED ORDER — FENTANYL CITRATE (PF) 100 MCG/2ML IJ SOLN
INTRAMUSCULAR | Status: AC
  Filled 2021-01-06: qty 2

## 2021-01-06 MED ORDER — FENTANYL CITRATE (PF) 100 MCG/2ML IJ SOLN
INTRAMUSCULAR | Status: DC | PRN
  Administered 2021-01-06: 50 ug via INTRAVENOUS

## 2021-01-06 MED ORDER — METOCLOPRAMIDE HCL 5 MG PO TABS: 5.0000 mg | ORAL_TABLET | Freq: Three times a day (TID) | ORAL | Status: DC | PRN

## 2021-01-06 MED ORDER — ACETAMINOPHEN 325 MG PO TABS: 325.0000 mg | ORAL_TABLET | Freq: Four times a day (QID) | ORAL | Status: DC | PRN

## 2021-01-06 MED ORDER — VITAMIN D 25 MCG (1000 UNIT) PO TABS
2000.0000 [IU] | ORAL_TABLET | Freq: Every day | ORAL | Status: DC
  Administered 2021-01-07 – 2021-01-09 (×3): 2000 [IU] via ORAL
  Filled 2021-01-06 (×3): qty 2

## 2021-01-06 MED ORDER — CITALOPRAM HYDROBROMIDE 20 MG PO TABS
20.0000 mg | ORAL_TABLET | Freq: Every day | ORAL | Status: DC
  Administered 2021-01-07 – 2021-01-09 (×3): 20 mg via ORAL
  Filled 2021-01-06 (×3): qty 1

## 2021-01-06 MED ORDER — IRRISEPT - 450ML BOTTLE WITH 0.05% CHG IN STERILE WATER, USP 99.95% OPTIME
TOPICAL | Status: DC | PRN
  Administered 2021-01-06: 300 mL via TOPICAL

## 2021-01-06 MED ORDER — DOCUSATE SODIUM 100 MG PO CAPS
100.0000 mg | ORAL_CAPSULE | Freq: Two times a day (BID) | ORAL | Status: DC
  Administered 2021-01-06 – 2021-01-09 (×6): 100 mg via ORAL
  Filled 2021-01-06 (×6): qty 1

## 2021-01-06 MED ORDER — SODIUM CHLORIDE 0.9% FLUSH
INTRAVENOUS | Status: DC | PRN
  Administered 2021-01-06: 40 mL

## 2021-01-06 MED ORDER — PROPOFOL 10 MG/ML IV BOLUS
INTRAVENOUS | Status: DC | PRN
  Administered 2021-01-06 (×4): 20 mg via INTRAVENOUS

## 2021-01-06 MED ORDER — MIDAZOLAM HCL 5 MG/5ML IJ SOLN
INTRAMUSCULAR | Status: DC | PRN
  Administered 2021-01-06 (×2): 1 mg via INTRAVENOUS

## 2021-01-06 MED ORDER — BUPIVACAINE IN DEXTROSE 0.75-8.25 % IT SOLN
INTRATHECAL | Status: DC | PRN
  Administered 2021-01-06: 1.6 mL via INTRATHECAL

## 2021-01-06 MED ORDER — FENTANYL CITRATE (PF) 100 MCG/2ML IJ SOLN: 25.0000 ug | INTRAMUSCULAR | Status: DC | PRN

## 2021-01-06 MED ORDER — STERILE WATER FOR IRRIGATION IR SOLN
Status: DC | PRN
  Administered 2021-01-06: 2000 mL

## 2021-01-06 MED ORDER — ONDANSETRON HCL 4 MG/2ML IJ SOLN
INTRAMUSCULAR | Status: DC | PRN
  Administered 2021-01-06: 4 mg via INTRAVENOUS

## 2021-01-06 MED ORDER — CEFAZOLIN SODIUM-DEXTROSE 2-4 GM/100ML-% IV SOLN
2.0000 g | Freq: Four times a day (QID) | INTRAVENOUS | Status: AC
  Administered 2021-01-06 – 2021-01-07 (×3): 2 g via INTRAVENOUS
  Filled 2021-01-06 (×3): qty 100

## 2021-01-06 MED ORDER — POLYETHYLENE GLYCOL 3350 17 G PO PACK: 17.0000 g | PACK | Freq: Every day | ORAL | 0 refills | Status: AC

## 2021-01-06 MED ORDER — BUPIVACAINE-EPINEPHRINE (PF) 0.25% -1:200000 IJ SOLN
INTRAMUSCULAR | Status: AC
  Filled 2021-01-06: qty 30

## 2021-01-06 MED ORDER — DIPHENHYDRAMINE HCL 12.5 MG/5ML PO ELIX: 12.5000 mg | ORAL_SOLUTION | ORAL | Status: DC | PRN

## 2021-01-06 MED ORDER — ONDANSETRON HCL 4 MG/2ML IJ SOLN: 4.0000 mg | Freq: Four times a day (QID) | INTRAMUSCULAR | Status: DC | PRN

## 2021-01-06 MED ORDER — CHLORHEXIDINE GLUCONATE 0.12 % MT SOLN
15.0000 mL | Freq: Once | OROMUCOSAL | Status: AC
  Administered 2021-01-06: 15 mL via OROMUCOSAL

## 2021-01-06 MED ORDER — PHENYLEPHRINE HCL (PRESSORS) 10 MG/ML IV SOLN
INTRAVENOUS | Status: AC
  Filled 2021-01-06: qty 1

## 2021-01-06 MED ORDER — TRANEXAMIC ACID-NACL 1000-0.7 MG/100ML-% IV SOLN
1000.0000 mg | INTRAVENOUS | Status: AC
  Administered 2021-01-06: 1000 mg via INTRAVENOUS

## 2021-01-06 MED ORDER — B COMPLEX VITAMINS PO CAPS: 1.0000 | ORAL_CAPSULE | Freq: Every day | ORAL | Status: DC

## 2021-01-06 MED ORDER — HYDROCODONE-ACETAMINOPHEN 7.5-325 MG PO TABS
1.0000 | ORAL_TABLET | ORAL | Status: DC | PRN
  Administered 2021-01-08 – 2021-01-09 (×2): 2 via ORAL
  Filled 2021-01-06 (×2): qty 2

## 2021-01-06 MED ORDER — OXYCODONE HCL 5 MG/5ML PO SOLN: 5.0000 mg | Freq: Once | ORAL | Status: DC | PRN

## 2021-01-06 MED ORDER — HYDROCODONE-ACETAMINOPHEN 5-325 MG PO TABS
1.0000 | ORAL_TABLET | ORAL | Status: DC | PRN
  Administered 2021-01-06 – 2021-01-09 (×11): 2 via ORAL
  Filled 2021-01-06 (×11): qty 2

## 2021-01-06 MED ORDER — ONDANSETRON HCL 4 MG/2ML IJ SOLN
INTRAMUSCULAR | Status: AC
  Filled 2021-01-06: qty 2

## 2021-01-06 MED ORDER — MORPHINE SULFATE (PF) 2 MG/ML IV SOLN
0.5000 mg | INTRAVENOUS | Status: DC | PRN
Start: 2021-01-06 — End: 2021-01-09

## 2021-01-06 MED ORDER — METOCLOPRAMIDE HCL 5 MG/ML IJ SOLN: 5.0000 mg | Freq: Three times a day (TID) | INTRAMUSCULAR | Status: DC | PRN

## 2021-01-06 SURGICAL SUPPLY — 86 items
AGENT HMST SPONGE THK3/8 (HEMOSTASIS)
ATTUNE MED DOME PAT 38 KNEE (Knees) ×2 IMPLANT
ATTUNE PSFEM LTSZ6 NARCEM KNEE (Femur) ×2 IMPLANT
ATTUNE PSRP INSR SZ6 5 KNEE (Insert) ×2 IMPLANT
BAG DECANTER FOR FLEXI CONT (MISCELLANEOUS) ×2 IMPLANT
BAG SPEC THK2 15X12 ZIP CLS (MISCELLANEOUS)
BAG ZIPLOCK 12X15 (MISCELLANEOUS) IMPLANT
BASE TIBIAL ROT PLAT SZ 5 KNEE (Knees) ×1 IMPLANT
BLADE SAG 18X100X1.27 (BLADE) ×2 IMPLANT
BLADE SAW SGTL 11.0X1.19X90.0M (BLADE) ×2 IMPLANT
BLADE SAW SGTL 13.0X1.19X90.0M (BLADE) ×2 IMPLANT
BLADE SURG SZ10 CARB STEEL (BLADE) ×4 IMPLANT
BNDG CMPR MED 10X6 ELC LF (GAUZE/BANDAGES/DRESSINGS) ×1
BNDG COHESIVE 4X5 TAN STRL (GAUZE/BANDAGES/DRESSINGS) ×2 IMPLANT
BNDG ELASTIC 4X5.8 VLCR STR LF (GAUZE/BANDAGES/DRESSINGS) ×2 IMPLANT
BNDG ELASTIC 6X10 VLCR STRL LF (GAUZE/BANDAGES/DRESSINGS) ×2 IMPLANT
BNDG ELASTIC 6X5.8 VLCR STR LF (GAUZE/BANDAGES/DRESSINGS) ×2 IMPLANT
BSPLAT TIB 5 CMNT ROT PLAT STR (Knees) ×1 IMPLANT
CEMENT HV SMART SET (Cement) ×4 IMPLANT
COVER SURGICAL LIGHT HANDLE (MISCELLANEOUS) ×2 IMPLANT
COVER WAND RF STERILE (DRAPES) ×2 IMPLANT
CUFF TOURN SGL QUICK 34 (TOURNIQUET CUFF) ×2
CUFF TRNQT CYL 34X4.125X (TOURNIQUET CUFF) ×1 IMPLANT
DECANTER SPIKE VIAL GLASS SM (MISCELLANEOUS) ×2 IMPLANT
DRAPE INCISE IOBAN 66X45 STRL (DRAPES) IMPLANT
DRAPE ORTHO SPLIT 77X108 STRL (DRAPES) ×4
DRAPE SHEET LG 3/4 BI-LAMINATE (DRAPES) ×4 IMPLANT
DRAPE SURG ORHT 6 SPLT 77X108 (DRAPES) ×2 IMPLANT
DRAPE U-SHAPE 47X51 STRL (DRAPES) ×2 IMPLANT
DRSG AQUACEL AG ADV 3.5X10 (GAUZE/BANDAGES/DRESSINGS) ×2 IMPLANT
DRSG TEGADERM 4X4.75 (GAUZE/BANDAGES/DRESSINGS) IMPLANT
DURAPREP 26ML APPLICATOR (WOUND CARE) ×2 IMPLANT
ELECT BLADE TIP CTD 4 INCH (ELECTRODE) ×2 IMPLANT
ELECT REM PT RETURN 15FT ADLT (MISCELLANEOUS) ×2 IMPLANT
EVACUATOR 1/8 PVC DRAIN (DRAIN) IMPLANT
GAUZE SPONGE 2X2 8PLY STRL LF (GAUZE/BANDAGES/DRESSINGS) IMPLANT
GLOVE SRG 8 PF TXTR STRL LF DI (GLOVE) ×1 IMPLANT
GLOVE SURG ENC MOIS LTX SZ7 (GLOVE) ×2 IMPLANT
GLOVE SURG POLYISO LF SZ8 (GLOVE) ×4 IMPLANT
GLOVE SURG SS PI 7.5 STRL IVOR (GLOVE) ×4 IMPLANT
GLOVE SURG UNDER POLY LF SZ7.5 (GLOVE) ×2 IMPLANT
GLOVE SURG UNDER POLY LF SZ8 (GLOVE) ×2
GOWN STRL REUS W/TWL XL LVL3 (GOWN DISPOSABLE) ×4 IMPLANT
HANDPIECE INTERPULSE COAX TIP (DISPOSABLE) ×2
HEMOSTAT SPONGE AVITENE ULTRA (HEMOSTASIS) IMPLANT
HOLDER FOLEY CATH W/STRAP (MISCELLANEOUS) ×2 IMPLANT
IMMOBILIZER KNEE 20 (SOFTGOODS) ×2
IMMOBILIZER KNEE 20 THIGH 36 (SOFTGOODS) ×1 IMPLANT
JET LAVAGE IRRISEPT WOUND (IRRIGATION / IRRIGATOR) ×2
KIT TURNOVER KIT A (KITS) ×2 IMPLANT
LAVAGE JET IRRISEPT WOUND (IRRIGATION / IRRIGATOR) ×1 IMPLANT
MANIFOLD NEPTUNE II (INSTRUMENTS) ×2 IMPLANT
NDL SAFETY ECLIPSE 18X1.5 (NEEDLE) IMPLANT
NEEDLE HYPO 18GX1.5 SHARP (NEEDLE)
NS IRRIG 1000ML POUR BTL (IV SOLUTION) IMPLANT
PACK TOTAL KNEE CUSTOM (KITS) ×2 IMPLANT
PIN DRILL FIX HALF THREAD (BIT) ×2 IMPLANT
PIN STEINMAN FIXATION KNEE (PIN) ×2 IMPLANT
PROTECTOR NERVE ULNAR (MISCELLANEOUS) ×2 IMPLANT
SAW OSC TIP CART 19.5X105X1.3 (SAW) ×2 IMPLANT
SEALER BIPOLAR AQUA 6.0 (INSTRUMENTS) ×2 IMPLANT
SET HNDPC FAN SPRY TIP SCT (DISPOSABLE) ×1 IMPLANT
SPONGE GAUZE 2X2 STER 10/PKG (GAUZE/BANDAGES/DRESSINGS)
SPONGE LAP 4X18 RFD (DISPOSABLE) ×2 IMPLANT
SPONGE SURGIFOAM ABS GEL 100 (HEMOSTASIS) IMPLANT
STAPLER VISISTAT (STAPLE) IMPLANT
STRIP CLOSURE SKIN 1/2X4 (GAUZE/BANDAGES/DRESSINGS) ×4 IMPLANT
SUT BONE WAX W31G (SUTURE) ×2 IMPLANT
SUT MNCRL AB 4-0 PS2 18 (SUTURE) ×2 IMPLANT
SUT STRATAFIX 0 PDS 27 VIOLET (SUTURE) ×2
SUT VIC AB 1 CT1 27 (SUTURE) ×6
SUT VIC AB 1 CT1 27XBRD ANTBC (SUTURE) ×3 IMPLANT
SUT VIC AB 1 CTX 36 (SUTURE)
SUT VIC AB 1 CTX36XBRD ANBCTR (SUTURE) IMPLANT
SUT VIC AB 2-0 CT1 27 (SUTURE) ×6
SUT VIC AB 2-0 CT1 TAPERPNT 27 (SUTURE) ×3 IMPLANT
SUTURE STRATFX 0 PDS 27 VIOLET (SUTURE) ×1 IMPLANT
SYR 3ML LL SCALE MARK (SYRINGE) IMPLANT
SYR 50ML LL SCALE MARK (SYRINGE) IMPLANT
TIBIAL BASE ROT PLAT SZ 5 KNEE (Knees) ×2 IMPLANT
TOWER CARTRIDGE SMART MIX (DISPOSABLE) ×2 IMPLANT
TRAY FOLEY MTR SLVR 14FR STAT (SET/KITS/TRAYS/PACK) ×2 IMPLANT
TRAY FOLEY MTR SLVR 16FR STAT (SET/KITS/TRAYS/PACK) IMPLANT
WATER STERILE IRR 1000ML POUR (IV SOLUTION) ×2 IMPLANT
WIPE CHG CHLORHEXIDINE 2% (PERSONAL CARE ITEMS) ×2 IMPLANT
WRAP KNEE MAXI GEL POST OP (GAUZE/BANDAGES/DRESSINGS) ×2 IMPLANT

## 2021-01-06 NOTE — Evaluation (Addendum)
Physical Therapy Evaluation Patient Details Name: Jessica Bautista MRN: [id number] DOB: 03-10-1941 Today's Date: 01/06/2021   History of Present Illness  Jessica Bautista is an 80 yo female with past medical history significant for brease cancer, HTN, osteopenia, DVT and PE currently s/p L TKA 01/06/21.  Clinical Impression  Pt admitted with above diagnosis. PTA, pt independent with mobility, L knee pain with occasional buckling. Pt currently requiring min A with bed mobility due to weakness and pain in LLE. Pt able to perform SLR and quad set, no knee buckling noted with standing, transfer or limited steps. Pt unable to clear R foot from floor due to decreased weight-shift L, able to slide R foot with sidesteps over to chair. Pt limited in ambulation due to pain, min A for limited sidesteps, no LOB. Pt currently with functional limitations due to the deficits listed below (see PT Problem List). Pt will benefit from skilled PT to increase their independence and safety with mobility to allow discharge to the venue listed below.       Follow Up Recommendations Follow surgeon's recommendation for DC plan and follow-up therapies    Equipment Recommendations  None recommended by PT    Recommendations for Other Services       Precautions / Restrictions Precautions Precautions: Fall;Knee Restrictions Weight Bearing Restrictions: No Other Position/Activity Restrictions: WBAT      Mobility  Bed Mobility Overal bed mobility: Needs Assistance Bed Mobility: Supine to Sit  Supine to sit: Min assist;HOB elevated  General bed mobility comments: min A to slide LLE over to EOB, elevated HOB and use of bedrail to assist    Transfers Overall transfer level: Needs assistance Equipment used: Rolling walker (2 wheeled) Transfers: Sit to/from [facility] Sit to Stand: Min assist;From elevated surface Stand pivot transfers: Min assist  General transfer comment: min A to power up from  elevated bed, cues for hand placement to assist and L LE placement with rising and lowering, min A to steady over to chair  Ambulation/Gait Ambulation/Gait assistance: Min assist  Assistive device: Rolling walker (2 wheeled) Gait Pattern/deviations: Decreased weight shift to leftGeneral Gait Details: pt able to perform standing weight-shifts with minimal weight-shift L, attempted standing marching but pt unable to lift R foot off of floor due to decreased weight-shift L, limited to 4-5 sidesteps over to chair, able to clear LLE from floor but unable to clear RLE due to decreased weight-shift to L, limited by pain  Stairs            Wheelchair Mobility    Modified Rankin (Stroke Patients Only)       Balance Overall balance assessment: Needs assistance Sitting-balance support: Feet supported;No upper extremity supported Sitting balance-Leahy Scale: Good Sitting balance - Comments: seated EOB   Standing balance support: During functional activity;Bilateral upper extremity supported Standing balance-Leahy Scale: Poor Standing balance comment: reliant on UE support        Pertinent Vitals/Pain Pain Assessment: 0-10 Pain Score: 9  Pain Location: L knee Pain Descriptors / Indicators: Aching;Sore Pain Intervention(s): Limited activity within patient's tolerance;Monitored during session;Premedicated before session;Repositioned;Ice applied    Home Living Family/patient expects to be discharged to:: Private residence Living Arrangements: Alone Available Help at Discharge: Family;Available 24 hours/day Type of Home: House Home Access: Stairs to enter   [employer] of Steps: 5 Home Layout: Two level;Able to live on main level with bedroom/bathroom Home Equipment: Walker - 2 wheels Additional Comments: pt's sister staying for 9 days to assist her  while recovering.    Prior Function Level of Independence: Independent         Comments: Pt independent with  ambuation and ADLs prior to surgery.     Hand Dominance        Extremity/Trunk Assessment   Upper Extremity Assessment Upper Extremity Assessment: Overall WFL for tasks assessed    Lower Extremity Assessment Lower Extremity Assessment: Overall WFL for tasks assessed;LLE deficits/detail LLE Deficits / Details: ankle AROM WNL, knee strength able to perform quad set and 3 SLR without extensor lag LLE Sensation: WNL    Cervical / Trunk Assessment Cervical / Trunk Assessment: Normal  Communication   Communication: No difficulties  Cognition Arousal/Alertness: Awake/alert Behavior During Therapy: WFL for tasks assessed/performed Overall Cognitive Status: Within Functional Limits for tasks assessed        General Comments General comments (skin integrity, edema, etc.): on RA with SpO2 90% with mobility    Exercises     Assessment/Plan    PT Assessment Patient needs continued PT services  PT Problem List Decreased strength;Decreased range of motion;Decreased activity tolerance;Decreased balance;Decreased mobility;Decreased knowledge of use of DME;Pain       PT Treatment Interventions DME instruction;Gait training;Stair training;Functional mobility training;Therapeutic activities;Therapeutic exercise;Balance training;Patient/family education;Modalities    PT Goals (Current goals can be found in the Care Plan section)  Acute Rehab PT Goals Patient Stated Goal: home with sister to assist PT Goal Formulation: With patient Time For Goal Achievement: 01/20/21 Potential to Achieve Goals: Good    Frequency 7X/week   Barriers to discharge        Co-evaluation               AM-PAC PT "6 Clicks" Mobility  Outcome Measure Help needed turning from your back to your side while in a flat bed without using bedrails?: A Little Help needed moving from lying on your back to sitting on the side of a flat bed without using bedrails?: A Little Help needed moving to and from a  bed to a chair (including a wheelchair)?: A Little Help needed standing up from a chair using your arms (e.g., wheelchair or bedside chair)?: A Little Help needed to walk in hospital room?: A Lot Help needed climbing 3-5 steps with a railing? : A Lot 6 Click Score: 16    End of Session Equipment Utilized During Treatment: Gait belt Activity Tolerance: Patient limited by pain;Patient tolerated treatment well Patient left: in chair;with call bell/phone within reach;with family/visitor present Nurse Communication: Mobility status;Other (comment) (ice) PT Visit Diagnosis: Muscle weakness (generalized) (M62.81);Difficulty in walking, not elsewhere classified (R26.2);Pain Pain - Right/Left: Left Pain - part of body: Knee    Time: 1610-9604 PT Time Calculation (min) (ACUTE ONLY): 19 min   Charges:   PT Evaluation $PT Eval Low Complexity: 1 Low           Tori Anginette Espejo PT, DPT 01/06/21, 3:28 PM

## 2021-01-06 NOTE — Anesthesia Postprocedure Evaluation (Signed)
Anesthesia Post Note  Patient: Jessica Bautista  Procedure(s) Performed: TOTAL KNEE ARTHROPLASTY (Left Knee)     Patient location during evaluation: PACU Anesthesia Type: Regional and Spinal Level of consciousness: awake and alert Pain management: pain level controlled Vital Signs Assessment: post-procedure vital signs reviewed and stable Respiratory status: spontaneous breathing and respiratory function stable Cardiovascular status: blood pressure returned to baseline and stable Postop Assessment: spinal receding Anesthetic complications: no   No complications documented.  Last Vitals:  Vitals:   01/06/21 1145 01/06/21 1200  BP: (!) 137/57 137/66  Pulse: (!) 50 (!) 49  Resp: 15 15  Temp:    SpO2: 99% 99%    Last Pain:  Vitals:   01/06/21 1200  TempSrc:   PainSc: 0-No pain                 Merlinda Frederick

## 2021-01-06 NOTE — Interval H&P Note (Signed)
History and Physical Interval Note:  01/06/2021 8:17 AM  Jessica Bautista  has presented today for surgery, with the diagnosis of Left knee degenerative joint disease.  The various methods of treatment have been discussed with the patient and family. After consideration of risks, benefits and other options for treatment, the patient has consented to  Procedure(s) with comments: TOTAL KNEE ARTHROPLASTY (Left) - 2.5 hrs as a surgical intervention.  The patient's history has been reviewed, patient examined, no change in status, stable for surgery.  I have reviewed the patient's chart and labs.  Questions were answered to the patient's satisfaction.     Johnn Hai

## 2021-01-06 NOTE — Brief Op Note (Signed)
01/06/2021  11:20 AM  PATIENT:  Jessica Bautista  80 y.o. female  PRE-OPERATIVE DIAGNOSIS:  Left knee degenerative joint disease  POST-OPERATIVE DIAGNOSIS:  Left knee degenerative joint disease  PROCEDURE:  Procedure(s) with comments: TOTAL KNEE ARTHROPLASTY (Left) - 2.5 hrs  SURGEON:  Surgeon(s) and Role:    Susa Day, MD - Primary  PHYSICIAN ASSISTANT:   ASSISTANTS: bISSELL   ANESTHESIA:   general  EBL:  100 mL   BLOOD ADMINISTERED:none  DRAINS: none   LOCAL MEDICATIONS USED:  MARCAINE     SPECIMEN:  No Specimen  DISPOSITION OF SPECIMEN:  N/A  COUNTS:  YES  TOURNIQUET:   Total Tourniquet Time Documented: Thigh (Left) - 62 minutes Total: Thigh (Left) - 62 minutes   DICTATION: .Other Dictation: Dictation Number  [id number]  PLAN OF CARE: Admit to inpatient   PATIENT DISPOSITION:  PACU - hemodynamically stable.   Delay start of Pharmacological VTE agent (>24hrs) due to surgical blood loss or risk of bleeding: NO

## 2021-01-06 NOTE — Discharge Instructions (Addendum)
Elevate leg above heart 6x a day for 20minutes each °Use knee immobilizer while walking until can SLR x 10 °Use knee immobilizer in bed to keep knee in extension °Aquacel dressing may remain in place until follow up. May shower with aquacel dressing in place. If the dressing becomes saturated or peels off, you may remove aquacel dressing. Do not remove steri-strips if they are present. Place new dressing with gauze and tape or ACE bandage which should be kept clean and dry and changed daily. ° °INSTRUCTIONS AFTER JOINT REPLACEMENT  ° °o Remove items at home which could result in a fall. This includes throw rugs or furniture in walking pathways °o ICE to the affected joint every three hours while awake for 30 minutes at a time, for at least the first 3-5 days, and then as needed for pain and swelling.  Continue to use ice for pain and swelling. You may notice swelling that will progress down to the foot and ankle.  This is normal after surgery.  Elevate your leg when you are not up walking on it.   °o Continue to use the breathing machine you got in the hospital (incentive spirometer) which will help keep your temperature down.  It is common for your temperature to cycle up and down following surgery, especially at night when you are not up moving around and exerting yourself.  The breathing machine keeps your lungs expanded and your temperature down. ° ° °DIET:  As you were doing prior to hospitalization, we recommend a well-balanced diet. ° °DRESSING / WOUND CARE / SHOWERING ° °Keep the surgical dressing until follow up.  The dressing is water proof, so you can shower without any extra covering.  IF THE DRESSING FALLS OFF or the wound gets wet inside, change the dressing with sterile gauze.  Please use good hand washing techniques before changing the dressing.  Do not use any lotions or creams on the incision until instructed by your surgeon.   ° °ACTIVITY ° °o Increase activity slowly as tolerated, but follow the  weight bearing instructions below.   °o No driving for 6 weeks or until further direction given by your physician.  You cannot drive while taking narcotics.  °o No lifting or carrying greater than 10 lbs. until further directed by your surgeon. °o Avoid periods of inactivity such as sitting longer than an hour when not asleep. This helps prevent blood clots.  °o You may return to work once you are authorized by your doctor.  ° ° ° °WEIGHT BEARING  ° °Weight bearing as tolerated with assist device (walker, cane, etc) as directed, use it as long as suggested by your surgeon or therapist, typically at least 4-6 weeks. ° ° °EXERCISES ° °Results after joint replacement surgery are often greatly improved when you follow the exercise, range of motion and muscle strengthening exercises prescribed by your doctor. Safety measures are also important to protect the joint from further injury. Any time any of these exercises cause you to have increased pain or swelling, decrease what you are doing until you are comfortable again and then slowly increase them. If you have problems or questions, call your caregiver or physical therapist for advice.  ° °Rehabilitation is important following a joint replacement. After just a few days of immobilization, the muscles of the leg can become weakened and shrink (atrophy).  These exercises are designed to build up the tone and strength of the thigh and leg muscles and to improve motion. Often   times heat used for twenty to thirty minutes before working out will loosen up your tissues and help with improving the range of motion but do not use heat for the first two weeks following surgery (sometimes heat can increase post-operative swelling).  ° °These exercises can be done on a training (exercise) mat, on the floor, on a table or on a bed. Use whatever works the best and is most comfortable for you.    Use music or television while you are exercising so that the exercises are a pleasant  break in your day. This will make your life better with the exercises acting as a break in your routine that you can look forward to.   Perform all exercises about fifteen times, three times per day or as directed.  You should exercise both the operative leg and the other leg as well. ° °Exercises include: °  °• Quad Sets - Tighten up the muscle on the front of the thigh (Quad) and hold for 5-10 seconds.   °• Straight Leg Raises - With your knee straight (if you were given a brace, keep it on), lift the leg to 60 degrees, hold for 3 seconds, and slowly lower the leg.  Perform this exercise against resistance later as your leg gets stronger.  °• Leg Slides: Lying on your back, slowly slide your foot toward your buttocks, bending your knee up off the floor (only go as far as is comfortable). Then slowly slide your foot back down until your leg is flat on the floor again.  °• Angel Wings: Lying on your back spread your legs to the side as far apart as you can without causing discomfort.  °• Hamstring Strength:  Lying on your back, push your heel against the floor with your leg straight by tightening up the muscles of your buttocks.  Repeat, but this time bend your knee to a comfortable angle, and push your heel against the floor.  You may put a pillow under the heel to make it more comfortable if necessary.  ° °A rehabilitation program following joint replacement surgery can speed recovery and prevent re-injury in the future due to weakened muscles. Contact your doctor or a physical therapist for more information on knee rehabilitation.  ° ° °CONSTIPATION ° °Constipation is defined medically as fewer than three stools per week and severe constipation as less than one stool per week.  Even if you have a regular bowel pattern at home, your normal regimen is likely to be disrupted due to multiple reasons following surgery.  Combination of anesthesia, postoperative narcotics, change in appetite and fluid intake all can  affect your bowels.  ° °YOU MUST use at least one of the following options; they are listed in order of increasing strength to get the job done.  They are all available over the counter, and you may need to use some, POSSIBLY even all of these options:   ° °Drink plenty of fluids (prune juice may be helpful) and high fiber foods °Colace 100 mg by mouth twice a day  °Senokot for constipation as directed and as needed Dulcolax (bisacodyl), take with full glass of water  °Miralax (polyethylene glycol) once or twice a day as needed. ° °If you have tried all these things and are unable to have a bowel movement in the first 3-4 days after surgery call either your surgeon or your primary doctor.   ° °If you experience loose stools or diarrhea, hold the medications until you stool   forms back up.  If your symptoms do not get better within 1 week or if they get worse, check with your doctor.  If you experience "the worst abdominal pain ever" or develop nausea or vomiting, please contact the office immediately for further recommendations for treatment.   ITCHING:  If you experience itching with your medications, try taking only a single pain pill, or even half a pain pill at a time.  You can also use Benadryl over the counter for itching or also to help with sleep.   TED HOSE STOCKINGS:  Use stockings on both legs until for at least 2 weeks or as directed by physician office. They may be removed at night for sleeping.  MEDICATIONS:  See your medication summary on the After Visit Summary that nursing will review with you.  You may have some home medications which will be placed on hold until you complete the course of blood thinner medication.  It is important for you to complete the blood thinner medication as prescribed.  PRECAUTIONS:  If you experience chest pain or shortness of breath - call 911 immediately for transfer to the hospital emergency department.   If you develop a fever greater that 101 F, purulent  drainage from wound, increased redness or drainage from wound, foul odor from the wound/dressing, or calf pain - CONTACT YOUR SURGEON.                                                   FOLLOW-UP APPOINTMENTS:  If you do not already have a post-op appointment, please call the office for an appointment to be seen by your surgeon.  Guidelines for how soon to be seen are listed in your After Visit Summary, but are typically between 1-4 weeks after surgery.  OTHER INSTRUCTIONS:   Knee Replacement:  Do not place pillow under knee, focus on keeping the knee straight while resting. CPM instructions: 0-90 degrees, 2 hours in the morning, 2 hours in the afternoon, and 2 hours in the evening. Place foam block, curve side up under heel at all times except when in CPM or when walking.  DO NOT modify, tear, cut, or change the foam block in any way.   DENTAL ANTIBIOTICS:  In most cases prophylactic antibiotics for Dental procdeures after total joint surgery are not necessary.  Exceptions are as follows:  1. History of prior total joint infection  2. Severely immunocompromised (Organ Transplant, cancer chemotherapy, Rheumatoid biologic meds such as [place])  3. Poorly controlled diabetes (A1C &gt; 8.0, blood glucose over 200)  If you have one of these conditions, contact your surgeon for an antibiotic prescription, prior to your dental procedure.   MAKE SURE YOU:   Understand these instructions.   Get help right away if you are not doing well or get worse.    Thank you for letting us be a part of your medical care team.  It is a privilege we respect greatly.  We hope these instructions will help you stay on track for a fast and full recovery!    Information on my medicine - XARELTO (Rivaroxaban)  This medication education was reviewed with me or my healthcare representative as part of my discharge preparation.  The pharmacist that spoke with me during my hospital stay was:    Why was  Xarelto prescribed for you?  Xarelto was prescribed for you to reduce the risk of blood clots forming after orthopedic surgery. The medical term for these abnormal blood clots is venous thromboembolism (VTE).  What do you need to know about xarelto ? Take your Xarelto ONCE DAILY at the same time every day. You may take it either with or without food.  If you have difficulty swallowing the tablet whole, you may crush it and mix in applesauce just prior to taking your dose.  Take Xarelto exactly as prescribed by your doctor and DO NOT stop taking Xarelto without talking to the doctor who prescribed the medication.  Stopping without other VTE prevention medication to take the place of Xarelto may increase your risk of developing a clot.  After discharge, you should have regular check-up appointments with your healthcare provider that is prescribing your Xarelto.    What do you do if you miss a dose? If you miss a dose, take it as soon as you remember on the same day then continue your regularly scheduled once daily regimen the next day. Do not take two doses of Xarelto on the same day.   Important Safety Information A possible side effect of Xarelto is bleeding. You should call your healthcare provider right away if you experience any of the following: ? Bleeding from an injury or your nose that does not stop. ? Unusual colored urine (red or dark brown) or unusual colored stools (red or black). ? Unusual bruising for unknown reasons. ? A serious fall or if you hit your head (even if there is no bleeding).  Some medicines may interact with Xarelto and might increase your risk of bleeding while on Xarelto. To help avoid this, consult your healthcare provider or pharmacist prior to using any new prescription or non-prescription medications, including herbals, vitamins, non-steroidal anti-inflammatory drugs (NSAIDs) and supplements.  This website has more information on Xarelto:  [email or web address]

## 2021-01-06 NOTE — Care Plan (Signed)
Ortho Bundle Case Management Note  Patient Details  Name: Jessica Bautista MRN: [id number] Date of Birth: 1941/04/15                  L TKA on 01/06/21. DCP: Home with sister. 2 story home with 5 steps. DME: No needs. Has RW & elevated toilets. PT: [facility] 3/7   DME Arranged:  N/A DME Agency:     HH Arranged:    [place] Agency:     Additional Comments: Please contact me with any questions of if this plan should need to change.  Marianne Sofia, RN,CCM [facility]  [phone] 01/06/2021, 11:45 AM

## 2021-01-06 NOTE — Transfer of Care (Signed)
Immediate Anesthesia Transfer of Care Note  Patient: Jessica Bautista  Procedure(s) Performed: TOTAL KNEE ARTHROPLASTY (Left Knee)  Patient Location: PACU  Anesthesia Type:Spinal  Level of Consciousness: awake, alert  and oriented  Airway & Oxygen Therapy: Patient Spontanous Breathing and Patient connected to face mask oxygen  Post-op Assessment: Report given to RN and Post -op Vital signs reviewed and stable  Post vital signs: Reviewed and stable  Last Vitals:  Vitals Value Taken Time  BP 133/66 01/06/21 1125  Temp    Pulse 52 01/06/21 1127  Resp 16 01/06/21 1127  SpO2 100 % 01/06/21 1127  Vitals shown include unvalidated device data.  Last Pain:  Vitals:   01/06/21 0708  TempSrc: Oral  PainSc:       Patients Stated Pain Goal: 2 (70/11/00 3496)  Complications: No complications documented.

## 2021-01-06 NOTE — Anesthesia Procedure Notes (Signed)
Date/Time: 01/06/2021 8:44 AM Performed by: Sharlette Dense, CRNA Oxygen Delivery Method: Simple face mask

## 2021-01-06 NOTE — Anesthesia Procedure Notes (Signed)
Anesthesia Regional Block: Adductor canal block   Pre-Anesthetic Checklist: ,, timeout performed, Correct Patient, Correct Site, Correct Laterality, Correct Procedure, Correct Position, site marked, Risks and benefits discussed,  Surgical consent,  Pre-op evaluation,  At surgeon's request and post-op pain management  Laterality: Left  Prep: chloraprep       Needles:  Injection technique: Single-shot  Needle Type: Echogenic Stimulator Needle     Needle Length: 10cm      Additional Needles:   Procedures:,,,, ultrasound used (permanent image in chart),,,,  Narrative:  Start time: 01/06/2021 8:20 AM End time: 01/06/2021 8:25 AM Injection made incrementally with aspirations every 5 mL.  Performed by: Personally  Anesthesiologist: Merlinda Frederick, MD  Additional Notes: A functioning IV was confirmed and monitors were applied.  Sterile prep and drape, hand hygiene and sterile gloves were used.  Negative aspiration and test dose prior to incremental administration of local anesthetic. The patient tolerated the procedure well.Ultrasound  guidance: relevant anatomy identified, needle position confirmed, local anesthetic spread visualized around nerve(s), vascular puncture avoided.  Image printed for medical record.

## 2021-01-06 NOTE — Op Note (Signed)
NAME: Jessica Bautista, Jessica Bautista MEDICAL RECORD NO: [id number] ACCOUNT NO: [id number] DATE OF BIRTH: 1940-11-09 FACILITY: [facility] LOCATION: [id number] PHYSICIAN: Johnn Hai, MD  Operative Report   DATE OF PROCEDURE: 01/06/2021  PREOPERATIVE DIAGNOSIS:  End-stage osteoarthrosis, varus deformity, left knee.  POSTOPERATIVE DIAGNOSIS:  End-stage osteoarthrosis, varus deformity, left knee.  PROCEDURE PERFORMED:  Left total knee arthroplasty utilizing DePuy Attune rotating platform 6 femur, 5 tibia, 5 mm insert, 38 patella.  ANESTHESIA:  Spinal.  ASSISTANT:  Riki Rusk, PA.  HISTORY:  An 80 year old female with end-stage osteoarthrosis medial compartment, refractory to conservative treatment with bone-on-bone indicated for replacement of the degenerated joint.  Risks and benefits were discussed including bleeding, infection,  damage to neurovascular structures.  No change in symptoms, worsening symptoms, DVT, PE, anesthetic complications, etc.  DESCRIPTION OF PROCEDURE:  With the patient in supine position.  After induction of adequate spinal anesthesia, 2 grams Kefzol, the left lower extremity was prepped and draped and exsanguinated in the usual sterile fashion.  Thigh tourniquet inflated to  225 mmHg.  Midline incision was made over the knee, full thickness flaps developed.  Median parapatellar arthrotomy performed.  Soft tissues were elevated medially, preserving the MCL.  Patella everted, knee flexed.  Tricompartmental osteoarthrosis,  particularly medial compartment was noted.  Leksell rongeur utilized to remove osteophytes.  I removed the remnants of the ACL and the medial and lateral meniscus.  Debrided the fat pad.  I made femoral notch for insertion of the femoral drill.  This was  drilled, irrigated, T-handled reamer and then an intramedullary guide without difficulty.  A 5-degree left with 10 off the distal femur was selected, pinned and the cut was performed, protecting the soft tissues at  all times.  I then sized the femur  with a sizer off the anterior cortex to a 6.  Three degrees of external rotation.  This was pinned.  I performed the anterior, posterior and chamfer cuts.  With the soft tissues protected posteriorly at all times with a wide curved Crego.  Next, I  subluxed the tibia, placed a McHale removed the remnants of the medial and lateral menisci.  Cauterized geniculates.  Low point was medially.  External alignment guide was utilized 2 off the defect, which medially, was 9 off the high side laterally.   Bisecting the tibiotalar joint parallel to the shaft, 3 degrees slope.  This was pinned.  I performed the cut protecting the soft tissues and the popliteus at all time.  Following this, we used an extension block spacer at a 5.  It was full extension and  stable.  I then reflexed the knee, subluxed the tibia, sized it to a #5.  Just the medial aspect of the tibial tubercle.  This was pinned.  I harvested bone from the central tibial canal and impacted into the femoral canal.  I then drilled essentially  and then impacted our thin guide.  We maximized coverage.  Attention was turned back towards the femur.  We used our box cut jig bisecting the canal and performed the box cut.  Removed the jig and then impacted a trial femur, after impacting bone graft  into the femoral canal.  5 insert was placed.  Knee was then reduced.  He had full extension, full flexion, good stability to varus valgus stressing at 0 and 30 degrees, negative anterior drawer.  I then everted the patella was measured to a 25 planed to  16 with a patellar jig.  I then sized  to a 38 with a trial paddle parallel to the joint surface.  I drilled our 3 peg holes.  Placed a trial patella reduced it and had excellent patellofemoral tracking.  Following this, then we removed all  instrumentation, checked posteriorly.  Any further remnants of the menisci were removed, cauterized geniculates.  Capsule was intact as well as  the popliteus.  He was pulsatile lavage to clean the bone surfaces.  The knee was then flexed, patella was  everted.  Tibia subluxed.  All surfaces thoroughly dried.  We mixed cement on the back table in appropriate fashion and injected into the tibial canal, digitally pressurizing it.  Cement was placed on the tibial component and impacted into place with a  redundant cement was removed.  I cemented the femur with cement on the femoral component, impacted it.  Redundant cement removed and placed the final 5 trial insert, reduced it, held in axial load throughout the curing of the cement in full extension.   Redundant cement was removed.  It was cemented and clamped the patella.  0.25% Marcaine with epinephrine was placed in the joint.  Joint was covered during the curing of the cement.  After adequate curing of the cement, the tourniquet was deflated and  any bleeding, which was minimal, was cauterized using the Aquamantys and the medial and lateral gutters.  I had full extension and full flexion, and good stability.  I removed the insert.  We meticulously removed all redundant cement, copiously irrigated  the wound.  We used the IrriSept and then regular pulsatile lavage at low pressure.  Subluxed the tibia and selected a 5 permanent insert and placed it reduced the patella with towel clips.  He had full extension, full flexion, good stability to varus  valgus stressing at 0 and 30 degrees.  Negative anterior drawer.  Next, we injected Exparel 20 mL with 40 mL of saline into the joint.  The quadriceps tendon, the medial and lateral capsule.  I then slightly flexed the knee.  Reapproximated the patellar  arthrotomy with #1 Vicryl in interrupted figure-of-eight sutures.  Following this, we reinforced that with a running Stratafix.  Following this, we had excellent patellofemoral tracking and full flexion, full extension.  Next irrigated the subcutaneous tissue, subcutaneous with 2-0 and skin with Monocryl.   Sterile dressing applied, placed in a immobilizer and transported to the recovery room in satisfactory condition.  The patient tolerated the procedure well.  No complications.  Tourniquet time 62 minutes.  ESTIMATED BLOOD LOSS:  100 mL.   PUS D: 01/06/2021 11:29:19 am T: 01/06/2021 2:41:00 pm  JOB: [id number]/ [id number]

## 2021-01-06 NOTE — Anesthesia Procedure Notes (Signed)
Spinal  Patient location during procedure: OR Start time: 01/06/2021 8:44 AM End time: 01/06/2021 8:48 AM Staffing Performed: resident/CRNA  Resident/CRNA: Sharlette Dense, CRNA Preanesthetic Checklist Completed: patient identified, IV checked, site marked, risks and benefits discussed, surgical consent, monitors and equipment checked, pre-op evaluation and timeout performed Spinal Block Patient position: sitting Prep: DuraPrep and site prepped and draped Patient monitoring: heart rate, continuous pulse ox and blood pressure Approach: midline Location: L4-5 Injection technique: single-shot Needle Needle type: Pencan  Needle gauge: 24 G Needle length: 9 cm Additional Notes Kit expiration date 01/04/2022 and lot #[id number] Clear free flow of CSF, negative heme Tolerated well and returned to supine position

## 2021-01-06 NOTE — Anesthesia Preprocedure Evaluation (Signed)
Anesthesia Evaluation  Patient identified by MRN, date of birth, ID band Patient awake    Reviewed: Allergy & Precautions, NPO status , Patient's Chart, lab work & pertinent test results  Airway Mallampati: III  TM Distance: >3 FB Neck ROM: Full    Dental no notable dental hx.    Pulmonary former smoker,    Pulmonary exam normal breath sounds clear to auscultation       Cardiovascular hypertension, + DVT  Normal cardiovascular exam Rhythm:Regular Rate:Normal     Neuro/Psych PSYCHIATRIC DISORDERS Depression    GI/Hepatic negative GI ROS, Neg liver ROS,   Endo/Other  Hypothyroidism   Renal/GU negative Renal ROS  negative genitourinary   Musculoskeletal negative musculoskeletal ROS (+)   Abdominal   Peds negative pediatric ROS (+)  Hematology negative hematology ROS (+)   Anesthesia Other Findings   Reproductive/Obstetrics negative OB ROS                             Anesthesia Physical Anesthesia Plan  ASA: III  Anesthesia Plan: Spinal and Regional   Post-op Pain Management:  Regional for Post-op pain   Induction: Intravenous  PONV Risk Score and Plan: 2 and Propofol infusion, TIVA and Treatment may vary due to age or medical condition  Airway Management Planned: Natural Airway and Simple Face Mask  Additional Equipment: None  Intra-op Plan:   Post-operative Plan: Extubation in OR  Informed Consent: I have reviewed the patients History and Physical, chart, labs and discussed the procedure including the risks, benefits and alternatives for the proposed anesthesia with the patient or authorized representative who has indicated his/her understanding and acceptance.     Dental advisory given  Plan Discussed with: CRNA, Anesthesiologist and Surgeon  Anesthesia Plan Comments:         Anesthesia Quick Evaluation

## 2021-01-07 ENCOUNTER — Other Ambulatory Visit: Payer: Self-pay

## 2021-01-07 LAB — BASIC METABOLIC PANEL
Anion gap: 6 (ref 5–15)
BUN: 14 mg/dL (ref 8–23)
CO2: 28 mmol/L (ref 22–32)
Calcium: 8.4 mg/dL — ABNORMAL LOW (ref 8.9–10.3)
Chloride: 105 mmol/L (ref 98–111)
Creatinine, Ser: 0.71 mg/dL (ref 0.44–1.00)
GFR, Estimated: >60 mL/min (ref >60)
Glucose, Bld: 157 mg/dL — ABNORMAL HIGH (ref 70–99)
Potassium: 4 mmol/L (ref 3.5–5.1)
Sodium: 139 mmol/L (ref 135–145)

## 2021-01-07 LAB — CBC
HCT: 31.1 % — ABNORMAL LOW (ref 36.0–46.0)
Hemoglobin: 9.8 g/dL — ABNORMAL LOW (ref 12.0–15.0)
MCH: 31.5 pg (ref 26.0–34.0)
MCHC: 31.5 g/dL (ref 30.0–36.0)
MCV: 100 fL (ref 80.0–100.0)
Platelets: 272 10*3/uL (ref 150–400)
RBC: 3.11 MIL/uL — ABNORMAL LOW (ref 3.87–5.11)
RDW: 12.7 % (ref 11.5–15.5)
WBC: 14.3 10*3/uL — ABNORMAL HIGH (ref 4.0–10.5)
nRBC: 0 % (ref 0.0–0.2)

## 2021-01-07 MED ORDER — CHLORHEXIDINE GLUCONATE CLOTH 2 % EX PADS: 6.0000 | MEDICATED_PAD | Freq: Every day | CUTANEOUS | Status: DC

## 2021-01-07 NOTE — Progress Notes (Signed)
Subjective: 1 Day Post-Op Procedure(s) (LRB): TOTAL KNEE ARTHROPLASTY (Left) Patient reports pain as mild.   Seen in rounds by Dr. Tonita Cong  Objective: Vital signs in last 24 hours: Temp:  [97.6 F (36.4 C)-98.1 F (36.7 C)] 97.9 F (36.6 C) (03/03 0947) Pulse Rate:  [51-70] 55 (03/03 0947) Resp:  [16] 16 (03/03 0947) BP: (113-140)/(55-77) 113/59 (03/03 0947) SpO2:  [94 %-99 %] 95 % (03/03 0947)  Intake/Output from previous day: 03/02 0701 - 03/03 0700 In: 2071.3 [P.O.:100; I.V.:1471.3; IV Piggyback:500] Out: 1425 [Urine:1325; Blood:100] Intake/Output this shift: No intake/output data recorded.  Recent Labs    01/07/21 0307  HGB 9.8*   Recent Labs    01/07/21 0307  WBC 14.3*  RBC 3.11*  HCT 31.1*  PLT 272   Recent Labs    01/07/21 0307  NA 139  K 4.0  CL 105  CO2 28  BUN 14  CREATININE 0.71  GLUCOSE 157*  CALCIUM 8.4*   No results for input(s): LABPT, INR in the last 72 hours.  Neurologically intact ABD soft Neurovascular intact Sensation intact distally Intact pulses distally Dorsiflexion/Plantar flexion intact Incision: dressing C/D/I and no drainage No cellulitis present Compartment soft no sign of DVT   Assessment/Plan: 1 Day Post-Op Procedure(s) (LRB): TOTAL KNEE ARTHROPLASTY (Left) Advance diet Up with therapy D/C IV fluids   Anticipated LOS equal to or greater than 2 midnights due to - Age 81 and older with one or more of the following:  - Obesity  - Expected need for hospital services (PT, OT, Nursing) required for safe  discharge  - Anticipated need for postoperative skilled nursing care or inpatient rehab   Possible D/C tomorrow depending upon pain control and progress  Jessica Bautista 01/07/2021, 1:34 PM

## 2021-01-07 NOTE — Progress Notes (Signed)
Physical Therapy Treatment Patient Details Name: Jessica Bautista MRN: [id number] DOB: 1941-03-10 Today's Date: 01/07/2021    History of Present Illness Jessica Bautista is an 80 yo female with past medical history significant for brease cancer, HTN, osteopenia, DVT and PE currently s/p L TKA 01/06/21.    PT Comments    Pt very motivated and with marked improvement in activity tolerance.  Pt performed HEP with assist and up to ambulate limited distance in hall.   Follow Up Recommendations  Follow surgeon's recommendation for DC plan and follow-up therapies     Equipment Recommendations  None recommended by PT    Recommendations for Other Services       Precautions / Restrictions Precautions Precautions: Fall;Knee Required Braces or Orthoses: Knee Immobilizer - Left Knee Immobilizer - Left: Discontinue once straight leg raise with < 10 degree lag (Pt performed IND SLR this am) Restrictions Weight Bearing Restrictions: No Other Position/Activity Restrictions: WBAT    Mobility  Bed Mobility Overal bed mobility: Needs Assistance Bed Mobility: Supine to Sit     Supine to sit: Min guard     General bed mobility comments: min guard for L LE    Transfers Overall transfer level: Needs assistance Equipment used: Rolling walker (2 wheeled) Transfers: Sit to/from Stand Sit to Stand: Min guard         General transfer comment: cues for LE management and use of UEs to self assist  Ambulation/Gait Ambulation/Gait assistance: Min assist Gait Distance (Feet): 74 Feet Assistive device: Rolling walker (2 wheeled) Gait Pattern/deviations: Step-to pattern;Decreased stance time - left;Decreased weight shift to left;Shuffle;Trunk flexed Gait velocity: decr   General Gait Details: cues for sequence, posture, position from RW, increased L heel contact, and increased UE WB with noted initial buckling at L knee   Stairs             Wheelchair Mobility    Modified Rankin  (Stroke Patients Only)       Balance Overall balance assessment: Needs assistance Sitting-balance support: Feet supported;No upper extremity supported Sitting balance-Leahy Scale: Good     Standing balance support: During functional activity;Bilateral upper extremity supported Standing balance-Leahy Scale: Poor                              Cognition Arousal/Alertness: Awake/alert Behavior During Therapy: WFL for tasks assessed/performed Overall Cognitive Status: Within Functional Limits for tasks assessed                                        Exercises Total Joint Exercises Ankle Circles/Pumps: AROM;Both;20 reps;Supine Quad Sets: AROM;Both;10 reps;Supine Heel Slides: AAROM;Left;15 reps;Supine Straight Leg Raises: AROM;Left;Supine;10 reps    General Comments        Pertinent Vitals/Pain Pain Assessment: 0-10 Pain Score: 4  Pain Location: L knee Pain Descriptors / Indicators: Aching;Sore Pain Intervention(s): Limited activity within patient's tolerance;Monitored during session;Premedicated before session;Ice applied    Home Living                      Prior Function            PT Goals (current goals can now be found in the care plan section) Acute Rehab PT Goals Patient Stated Goal: home with sister to assist PT Goal Formulation: With patient Time For Goal Achievement: 01/20/21 Potential to Achieve  Goals: Good Progress towards PT goals: Progressing toward goals    Frequency    7X/week      PT Plan Current plan remains appropriate    Co-evaluation              AM-PAC PT "6 Clicks" Mobility   Outcome Measure  Help needed turning from your back to your side while in a flat bed without using bedrails?: A Little Help needed moving from lying on your back to sitting on the side of a flat bed without using bedrails?: A Little Help needed moving to and from a bed to a chair (including a wheelchair)?: A  Little Help needed standing up from a chair using your arms (e.g., wheelchair or bedside chair)?: A Little Help needed to walk in hospital room?: A Little Help needed climbing 3-5 steps with a railing? : A Lot 6 Click Score: 17    End of Session Equipment Utilized During Treatment: Gait belt Activity Tolerance: Patient tolerated treatment well Patient left: in chair;with call bell/phone within reach;with chair alarm set;with family/visitor present Nurse Communication: Mobility status;Other (comment) PT Visit Diagnosis: Muscle weakness (generalized) (M62.81);Difficulty in walking, not elsewhere classified (R26.2);Pain Pain - Right/Left: Left Pain - part of body: Knee     Time: 6195-0932 PT Time Calculation (min) (ACUTE ONLY): 28 min  Charges:  $Gait Training: 8-22 mins $Therapeutic Exercise: 8-22 mins                     [place] Pager [phone] Office [phone]    Celesta Funderburk 01/07/2021, 10:36 AM

## 2021-01-07 NOTE — Progress Notes (Signed)
Physical Therapy Treatment Patient Details Name: Jessica Bautista MRN: [id number] DOB: 1941-07-05 Today's Date: 01/07/2021    History of Present Illness Jessica Bautista is an 80 yo female with past medical history significant for brease cancer, HTN, osteopenia, DVT and PE currently s/p L TKA 01/06/21.    PT Comments    Pt continues very motivated and progressing steadily with mobility.  Pt hopeful for dc tomorrow.   Follow Up Recommendations  Follow surgeon's recommendation for DC plan and follow-up therapies     Equipment Recommendations  None recommended by PT    Recommendations for Other Services       Precautions / Restrictions Precautions Precautions: Fall;Knee Required Braces or Orthoses: Knee Immobilizer - Left Knee Immobilizer - Left: Discontinue once straight leg raise with < 10 degree lag Restrictions Weight Bearing Restrictions: No Other Position/Activity Restrictions: WBAT    Mobility  Bed Mobility Overal bed mobility: Needs Assistance Bed Mobility: Sit to Supine       Sit to supine: Min guard   General bed mobility comments: min guard for L LE    Transfers Overall transfer level: Needs assistance Equipment used: Rolling walker (2 wheeled) Transfers: Sit to/from Stand Sit to Stand: Min guard         General transfer comment: cues for LE management and use of UEs to self assist  Ambulation/Gait Ambulation/Gait assistance: Min assist;Min guard Gait Distance (Feet): 75 Feet Assistive device: Rolling walker (2 wheeled) Gait Pattern/deviations: Step-to pattern;Decreased stance time - left;Decreased weight shift to left;Shuffle;Trunk flexed Gait velocity: decr   General Gait Details: cues for sequence, posture, position from RW; no buckling noted at R knee this session   Stairs             Wheelchair Mobility    Modified Rankin (Stroke Patients Only)       Balance Overall balance assessment: Needs assistance Sitting-balance support: Feet  supported;No upper extremity supported Sitting balance-Leahy Scale: Good     Standing balance support: During functional activity;Bilateral upper extremity supported Standing balance-Leahy Scale: Poor                              Cognition Arousal/Alertness: Awake/alert Behavior During Therapy: WFL for tasks assessed/performed Overall Cognitive Status: Within Functional Limits for tasks assessed                                        Exercises      General Comments        Pertinent Vitals/Pain Pain Assessment: 0-10 Pain Score: 4  Pain Location: L knee Pain Descriptors / Indicators: Aching;Sore Pain Intervention(s): Limited activity within patient's tolerance;Monitored during session;Premedicated before session;Ice applied    Home Living                      Prior Function            PT Goals (current goals can now be found in the care plan section) Acute Rehab PT Goals Patient Stated Goal: home with sister to assist PT Goal Formulation: With patient Time For Goal Achievement: 01/20/21 Potential to Achieve Goals: Good Progress towards PT goals: Progressing toward goals    Frequency    7X/week      PT Plan Current plan remains appropriate    Co-evaluation  AM-PAC PT "6 Clicks" Mobility   Outcome Measure  Help needed turning from your back to your side while in a flat bed without using bedrails?: A Little Help needed moving from lying on your back to sitting on the side of a flat bed without using bedrails?: A Little Help needed moving to and from a bed to a chair (including a wheelchair)?: A Little Help needed standing up from a chair using your arms (e.g., wheelchair or bedside chair)?: A Little Help needed to walk in hospital room?: A Little Help needed climbing 3-5 steps with a railing? : A Lot 6 Click Score: 17    End of Session Equipment Utilized During Treatment: Gait belt Activity  Tolerance: Patient tolerated treatment well Patient left: in bed;with call bell/phone within reach;with family/visitor present Nurse Communication: Mobility status PT Visit Diagnosis: Muscle weakness (generalized) (M62.81);Difficulty in walking, not elsewhere classified (R26.2);Pain Pain - Right/Left: Left Pain - part of body: Knee     Time: 1524-1550 PT Time Calculation (min) (ACUTE ONLY): 26 min  Charges:  $Gait Training: 23-37 mins                     [place] Pager [phone] Office [phone]    Falan Hensler 01/07/2021, 4:26 PM

## 2021-01-07 NOTE — Progress Notes (Signed)
Please note pt is Ortho Bundle.  Have confirmed plan for OP @ [facility]. No DME needs.  No TOC needs.  Knolan Simien, LCSW

## 2021-01-08 ENCOUNTER — Encounter (HOSPITAL_COMMUNITY): Payer: Self-pay | Admitting: Specialist

## 2021-01-08 LAB — CBC
HCT: 28.4 % — ABNORMAL LOW (ref 36.0–46.0)
Hemoglobin: 9.1 g/dL — ABNORMAL LOW (ref 12.0–15.0)
MCH: 32.2 pg (ref 26.0–34.0)
MCHC: 32 g/dL (ref 30.0–36.0)
MCV: 100.4 fL — ABNORMAL HIGH (ref 80.0–100.0)
Platelets: 229 10*3/uL (ref 150–400)
RBC: 2.83 MIL/uL — ABNORMAL LOW (ref 3.87–5.11)
RDW: 12.9 % (ref 11.5–15.5)
WBC: 8.4 10*3/uL (ref 4.0–10.5)
nRBC: 0 % (ref 0.0–0.2)

## 2021-01-08 MED ORDER — HYDROCODONE-ACETAMINOPHEN 5-325 MG PO TABS: 1.0000 | ORAL_TABLET | ORAL | 0 refills | Status: AC | PRN

## 2021-01-08 NOTE — Discharge Summary (Signed)
Physician Discharge Summary   Patient ID: [name] MRN: [id number] DOB/AGE: July 10, 1941 80 y.o.  Admit date: 01/06/2021 Discharge date: 01/08/21  Primary Diagnosis: left knee primary osteoarthritis  Admission Diagnoses:  Past Medical History:  Diagnosis Date  . Atypical nevi   . Basal cell carcinoma   . Breast cancer ([place])   . DVT (deep venous thrombosis) ([place])   . Eczema   . Family history of colonic polyps   . Fatty (change of) liver, not elsewhere classified   . Hyperlipidemia   . Hypertension   . Hypothyroidism   . Impaired fasting glucose   . Insomnia   . Lichen sclerosus et atrophicus of the vulva   . Major depression, single episode   . Nontoxic single thyroid nodule   . Osteopenia   . Psoriasis   . Pulmonary embolus ([place]) 1999   post op tummy tuc  . Skin cancer   . Thyroid disease   . Vitamin D deficiency, unspecified   . Wears glasses    Discharge Diagnoses:   Active Problems:   Left knee DJD  Estimated body mass index is 32.1 kg/m as calculated from the following:   Height as of this encounter: 5\' 4"  (1.626 m).   Weight as of this encounter: 84.8 kg.  Procedure:  Procedure(s) (LRB): TOTAL KNEE ARTHROPLASTY (Left)   Consults: None  HPI: see H&P Laboratory Data: Admission on 01/06/2021  Component Date Value Ref Range Status  . WBC 01/07/2021 14.3* 4.0 - 10.5 K/uL Final  . RBC 01/07/2021 3.11* 3.87 - 5.11 MIL/uL Final  . Hemoglobin 01/07/2021 9.8* 12.0 - 15.0 g/dL Final  . HCT 01/07/2021 31.1* 36.0 - 46.0 % Final  . MCV 01/07/2021 100.0  80.0 - 100.0 fL Final  . MCH 01/07/2021 31.5  26.0 - 34.0 pg Final  . MCHC 01/07/2021 31.5  30.0 - 36.0 g/dL Final  . RDW 01/07/2021 12.7  11.5 - 15.5 % Final  . Platelets 01/07/2021 272  150 - 400 K/uL Final  . nRBC 01/07/2021 0.0  0.0 - 0.2 % Final   Performed at [facility], [place] [address] [place], [place] [address]  . Sodium 01/07/2021 139  135 - 145 mmol/L Final  . Potassium  01/07/2021 4.0  3.5 - 5.1 mmol/L Final  . Chloride 01/07/2021 105  98 - 111 mmol/L Final  . CO2 01/07/2021 28  22 - 32 mmol/L Final  . Glucose, Bld 01/07/2021 157* 70 - 99 mg/dL Final   Glucose reference range applies only to samples taken after fasting for at least 8 hours.  . BUN 01/07/2021 14  8 - 23 mg/dL Final  . Creatinine, Ser 01/07/2021 0.71  0.44 - 1.00 mg/dL Final  . Calcium 01/07/2021 8.4* 8.9 - 10.3 mg/dL Final  . GFR, Estimated 01/07/2021 >60  >60 mL/min Final   Comment: (NOTE) Calculated using the CKD-EPI Creatinine Equation (2021)   . Anion gap 01/07/2021 6  5 - 15 Final   Performed at [facility], [place] [address], [place], [place] [address]  . WBC 01/08/2021 8.4  4.0 - 10.5 K/uL Final  . RBC 01/08/2021 2.83* 3.87 - 5.11 MIL/uL Final  . Hemoglobin 01/08/2021 9.1* 12.0 - 15.0 g/dL Final  . HCT 01/08/2021 28.4* 36.0 - 46.0 % Final  . MCV 01/08/2021 100.4* 80.0 - 100.0 fL Final  . MCH 01/08/2021 32.2  26.0 - 34.0 pg Final  . MCHC 01/08/2021 32.0  30.0 - 36.0 g/dL Final  .  RDW 01/08/2021 12.9  11.5 - 15.5 % Final  . Platelets 01/08/2021 229  150 - 400 K/uL Final  . nRBC 01/08/2021 0.0  0.0 - 0.2 % Final   Performed at [facility], [place] [address], [place], [place] [address]  Hospital Outpatient Visit on 01/04/2021  Component Date Value Ref Range Status  . SARS Coronavirus 2 01/04/2021 NEGATIVE  NEGATIVE Final   Comment: (NOTE) SARS-CoV-2 target nucleic acids are NOT DETECTED.  The SARS-CoV-2 RNA is generally detectable in upper and lower respiratory specimens during the acute phase of infection. Negative results do not preclude SARS-CoV-2 infection, do not rule out co-infections with other pathogens, and should not be used as the sole basis for treatment or other patient management decisions. Negative results must be combined with clinical observations, patient history, and epidemiological information. The expected result is  Negative.  Fact Sheet for Patients: SugarRoll.be  Fact Sheet for Healthcare Providers: https://www.woods-mathews.com/  This test is not yet approved or cleared by the [place] FDA and  has been authorized for detection and/or diagnosis of SARS-CoV-2 by FDA under an Emergency Use Authorization (EUA). This EUA will remain  in effect (meaning this test can be used) for the duration of the COVID-19 declaration under Se                          ction 564(b)(1) of the Act, 21 U.S.C. section 360bbb-3(b)(1), unless the authorization is terminated or revoked sooner.  Performed at [facility], [place] [address] [place], [place] [address]   Hospital Outpatient Visit on 12/30/2020  Component Date Value Ref Range Status  . WBC 12/30/2020 7.8  4.0 - 10.5 K/uL Final  . RBC 12/30/2020 3.90  3.87 - 5.11 MIL/uL Final  . Hemoglobin 12/30/2020 12.1  12.0 - 15.0 g/dL Final  . HCT 12/30/2020 38.2  36.0 - 46.0 % Final  . MCV 12/30/2020 97.9  80.0 - 100.0 fL Final  . MCH 12/30/2020 31.0  26.0 - 34.0 pg Final  . MCHC 12/30/2020 31.7  30.0 - 36.0 g/dL Final  . RDW 12/30/2020 13.0  11.5 - 15.5 % Final  . Platelets 12/30/2020 294  150 - 400 K/uL Final  . nRBC 12/30/2020 0.0  0.0 - 0.2 % Final   Performed at [facility], [place] [address], [place], [place] [address]  . Sodium 12/30/2020 141  135 - 145 mmol/L Final  . Potassium 12/30/2020 4.2  3.5 - 5.1 mmol/L Final  . Chloride 12/30/2020 106  98 - 111 mmol/L Final  . CO2 12/30/2020 24  22 - 32 mmol/L Final  . Glucose, Bld 12/30/2020 97  70 - 99 mg/dL Final   Glucose reference range applies only to samples taken after fasting for at least 8 hours.  . BUN 12/30/2020 11  8 - 23 mg/dL Final  . Creatinine, Ser 12/30/2020 0.68  0.44 - 1.00 mg/dL Final  . Calcium 12/30/2020 9.3  8.9 - 10.3 mg/dL Final  . GFR, Estimated 12/30/2020 >60  >60 mL/min Final   Comment: (NOTE) Calculated using the  CKD-EPI Creatinine Equation (2021)   . Anion gap 12/30/2020 11  5 - 15 Final   Performed at [facility], [place] [address], [place], [place] [address]  . Prothrombin Time 12/30/2020 13.0  11.4 - 15.2 seconds Final  . INR 12/30/2020 1.0  0.8 - 1.2 Final   Comment: (NOTE) INR goal varies based on device  and disease states. Performed at [facility], [place] [address] [place], [place] [address]   . aPTT 12/30/2020 30  24 - 36 seconds Final   Performed at [facility], [place] [address] [place], [place] [address]  . Color, Urine 12/30/2020 YELLOW  YELLOW Final  . APPearance 12/30/2020 CLEAR  CLEAR Final  . Specific Gravity, Urine 12/30/2020 1.026  1.005 - 1.030 Final  . pH 12/30/2020 5.0  5.0 - 8.0 Final  . Glucose, UA 12/30/2020 NEGATIVE  NEGATIVE mg/dL Final  . Hgb urine dipstick 12/30/2020 NEGATIVE  NEGATIVE Final  . Bilirubin Urine 12/30/2020 NEGATIVE  NEGATIVE Final  . Ketones, ur 12/30/2020 20* NEGATIVE mg/dL Final  . Protein, ur 12/30/2020 NEGATIVE  NEGATIVE mg/dL Final  . Nitrite 12/30/2020 NEGATIVE  NEGATIVE Final  . Chalmers Guest 12/30/2020 NEGATIVE  NEGATIVE Final   Performed at [facility], [place] [address], [place], [place] [address]  . MRSA, PCR 12/30/2020 NEGATIVE  NEGATIVE Final  . Staphylococcus aureus 12/30/2020 NEGATIVE  NEGATIVE Final   Comment: (NOTE) The Xpert SA Assay (FDA approved for NASAL specimens in patients 81 years of age and older), is one component of a comprehensive surveillance program. It is not intended to diagnose infection nor to guide or monitor treatment. Performed at [facility], [place] [address] [place], [place] [address]   Clinical Support on 12/07/2020  Component Date Value Ref Range Status  . Coagulation Factor VIII 12/07/2020 188* 56 - 140 % Final   Comment: (NOTE) FVIII activity can increase in a variety of clinical situations including normal  pregnancy, in samples drawn from patients (particularly children) who are visibly stressed at the time of phlebotomy, as acute phase reactants, or in response to certain drug therapies such as DDAVP.  Persistently elevated FVIII activity is a risk factor for venous thrombosis as well as recurrence of venous thrombosis.  Risk is graded and increases with the degree of elevation.  Although elevated FVIII activity has been identified to cluster within families, a genetic basis for the elevation has not yet been elucidated (Br J Haematol. 2012; 277:824-235).   . Ristocetin Co-factor, Plasma 12/07/2020 131  50 - 200 % Final   Comment: (NOTE) Performed At: [facility] [place], [place] [id number] Rush Farmer MD [id number]   . Von Willebrand Antigen, Plasma 12/07/2020 137  50 - 200 % Final   Comment: (NOTE) This test was developed and its performance characteristics determined by [facility]. It has not been cleared or approved by the Food and Drug Administration.   Marland Kitchen aPTT 12/07/2020 28  24 - 36 seconds Final   Performed at [facility], [place] [address], [place], [place] [address]  . Prothrombin Time 12/07/2020 11.8  11.4 - 15.2 seconds Final  . INR 12/07/2020 0.9  0.8 - 1.2 Final   Comment: (NOTE) INR goal varies based on device and disease states. Performed at [facility], [place] [address], [place],  [address]   . Recommendations-PTGENE: 12/07/2020 Comment   Final   Comment: (NOTE) Result: c.*97G>A - Not Detected This result is not associated with an increased risk for venous thromboembolism. See Additional Clinical Information and Comments. Additional Clinical Information: Venous thromboembolism is a multifactorial disease influenced by genetic, environmental, and circumstantial risk factors. The c.*97G>A variant in the F2 gene is a genetic risk factor for venous thromboembolism. Heterozygous carriers have a 2-  to 4-fold increased risk for venous thromboembolism. Homozygotes for the  c.*97G>A variant are rare. The annual risk of VTE in homozygotes has been reported to be 1.1%/year. Individuals who carry both a c.*97G>A variant in the F2 gene and a c.1601G>A (p. Arg534Gln) variant in the F5 gene (commonly referred to as Factor V Leiden) have an approximately 20- fold increased risk for venous thromboembolism. Risks are likely to be even higher in more complex genotype combinations involving the F2 c.*97G>A variant and Factor V Leiden (PMID:                           30160109). Additional risk factors include but are not limited to: deficiency of protein C, protein S, or antithrombin III, age, female sex, personal or family history of deep vein thromboembolism, smoking, surgery, prolonged immobilization, malignant neoplasm, tamoxifen treatment, raloxifene treatment, oral contraceptive use, hormone replacement therapy, and pregnancy. Management of thrombotic risk and thrombotic events should follow established guidelines and fit the clinical circumstance. This result cannot predict the occurrence or recurrence of a thrombotic event. Comments: Genetic counseling is recommended to discuss the potential clinical implications of positive results, as well as recommendations for testing family members. Genetic Coordinators are available for health care providers to discuss results at [phone]). Test Details: Variant analyzed: c.*97G>A, previously referred to as G20210A Methods/Limitations: DNA analysis of the F2 gene (NM_000                          506.5) was performed by PCR amplification followed by restriction enzyme analysis. The diagnostic sensitivity is >99%. Results must be combined with clinical information for the most accurate interpretation. Molecular-based testing is highly accurate, but as in any laboratory test, diagnostic errors may occur. False positive or false negative  results may occur for reasons that include genetic variants, blood transfusions, bone marrow transplantation, somatic or tissue-specific mosaicism, mislabeled samples, or erroneous representation of family relationships. This test was developed and its performance characteristics determined by [facility]. It has not been cleared or approved by the Food and Drug Administration. References: Jamse [place] [facility], [place] [facility]; ACMG Professional Practice and Guidelines Committee. Addendum: [place] consensus statement on factor V Leiden mutation testing. Genet Med. 2021 Mar 5. doi: 57.3220/U542                          36-021-01108-x. PMID: 70623762. Kristopher Oppenheim. Prothrombin Thrombophilia. 2006 Jul 25 [Updated 2021 Feb 4]. In: Tarri Glenn, Ardinger HH, Pagon RA, et al., editors. GeneReviews(R) [Internet]. [address] ([place]): [place], [place]; 1993-2021. Available from: https://www.cook-brown.com/ Terrilee Files, Carla Drape, Marin Shutter CS; ACMG Laboratory Quality Assurance Committee. Venous thromboembolism laboratory testing (factor V Leiden and factor II c.*97G>A), 2018 update: a technical standard of the [place] (ACMG). Genet Med. 2018 Dec;20(12):1489-1498. doi: 83.1517/[id number]-S. Epub 2018 Oct 5. PMID: 85462703. Allison Quarry, PhD, [facility] Ruben Reason, PhD, [facility] Earlean Polka, PhD, [facility] Threasa Alpha, PhD, [facility] W Gailen Shelter, PhD, [facility] Alfredo Bach, PhD, [facility] Performed At: [facility] [address] [place], [place] [id number] Katina Degree MDPhD [id number]                          3   . Recommendations-F5LEID: 12/07/2020 Comment   Final   Comment: (NOTE) Result: c.1601G>A (p.Arg534Gln) -  Not Detected This result is not associated with an increased risk for venous thromboembolism. See Additional Clinical Information  and Comments. Additional Clinical Information: Venous thromboembolism is a multifactorial disease influenced by genetic, environmental, and circumstantial risk factors. The c.1601G>A (p. Arg534Gln) variant in the F5 gene, commonly referred to as Factor V Leiden, is a genetic risk factor for venous thromboembolism. Heterozygous carriers of this variant have a 6- to 8- fold increased risk for venous thromboembolism. Individuals homozygous for this variant (ie, with a copy of the variant on each chromosome) have an approximately 80-fold increased risk for venous thromboembolism. Individuals who carry both a c.*97G>A variant in the F2 gene and Factor V Leiden have an approximately 20-fold increased risk for venous thromboembolism. Risks are likely to be even higher in more complex genotype combinations in                          volving the F2 c.*97G>A variant and Factor V Leiden (PMID: 64403474). Additional risk factors include but are not limited to: deficiency of protein C, protein S, or antithrombin III, age, female sex, personal or family history of deep vein thromboembolism, smoking, surgery, prolonged immobilization, malignant neoplasm, tamoxifen treatment, raloxifene treatment, oral contraceptive use, hormone replacement therapy, and pregnancy. Management of thrombotic risk and thrombotic events should follow established guidelines and fit the clinical circumstance. This result cannot predict the occurrence or recurrence of a thrombotic event. Comment: Genetic counseling is recommended to discuss the potential clinical implications of positive results, as well as recommendations for testing family members. Genetic Coordinators are available for health care providers to discuss results at [phone]). Test Details: Variant Analyzed: c.1601G>A (p. Arg534Gln), referred to as Fact                          or V Leiden Methods/Limitations: DNA analysis of the F5 gene  (NM_000130.5) was performed by PCR amplification followed by restriction enzyme analysis. The diagnostic sensitivity is >99%. Results must be combined with clinical information for the most accurate interpretation. Molecular- based testing is highly accurate, but as in any laboratory test, diagnostic errors may occur. False positive or false negative results may occur for reasons that include genetic variants, blood transfusions, bone marrow transplantation, somatic or tissue-specific mosaicism, mislabeled samples, or erroneous representation of family relationships. This test was developed and its performance characteristics determined by [facility]. It has not been cleared or approved by the Food and Drug Administration. References: Jamse [place] [facility], Laurann [place] [facility]; ACMG Professional Practice and Guidelines Committee. Addendum: [place] consensus statement on fac                          tor V Leiden mutation testing. Genet Med. 2021 Mar 5. doi: 63.8756/E33295-188- 01108-x. PMID: 41660630. Kristopher Oppenheim. Factor V Leiden Thrombophilia. 1999 May 14 [Updated 2018 Jan 4]. In: Tarri Glenn, Ardinger HH, Pagon RA, et al., editors. GeneReviews(R) [Internet]. [address] ([place]): [place], [place]; 1993-2021. Available from: MortgageHole.tn Terrilee Files, Carla Drape, Marin Shutter CS; ACMG Laboratory Quality Assurance Committee. Venous thromboembolism laboratory testing (factor V Leiden and factor II c.*97G>A), 2018 update: a technical standard of the [place] (ACMG). Genet Med. 2018 Dec;20(12):1489-1498. doi: 16.0109/[id number]-Y. Epub 2018 Oct 5. PMID: 70623762. Allison Quarry, PhD, [facility] Ruben Reason, PhD, [facility]  [place]arlean Polka, PhD, [facility] Threasa Alpha, PhD, [facility] W Gailen Shelter, PhD, [facility] Alfredo Bach, PhD, [facility] Performed  At: [facility]                          [place] [address] [place], [place] [id number] [place] Degree MDPhD [id number]   . Interpretation 12/07/2020 Note   Final   Comment: (NOTE) ------------------------------- COAGULATION: VON WILLEBRAND FACTOR ASSESSMENT CURRENT RESULTS ASSESSMENT The VWF:Ag is normal. The VWF:RCo is normal. The FVIII is elevated. VON WILLEBRAND FACTOR ASSESSMENT CURRENT RESULTS INTERPRETATION - These results are not consistent with a diagnosis of VWD according to the current NHLBI guideline. Persistently elevated FVIII activity is a risk factor for venous thrombosis as well as recurrence of venous thrombosis. Risk is graded and increases with the degree of elevation. Although elevated FVIII activity has been identified to cluster within families, a genetic basis for the elevation has not yet been elucidated (Br J Haematol. 2012; 157(6):653-663). VON WILLEBRAND FACTOR ASSESSMENT - Results may be falsely elevated and possibly falsely normal as VWF and FVIII may increase in samples drawn from patients (particularly children) who are visibly stressed at the time of phlebotomy, as acute phase reactants, or in respons                          e to certain drug therapies such as desmopressin. Repeat testing may be necessary before excluding a diagnosis of VWD especially if the clinical suspicion is high for an underlying bleeding disorder. The setting for phlebotomy should be as calm as possible and patients should be encouraged to sit quietly prior to the blood draw. VON WILLEBRAND FACTOR ASSESSMENT DEFINITIONS - VWD - von Willebrand disease; VWF - von Willebrand factor; VWF:Ag - VWF antigen; VWF:RCo - VWF ristocetin cofactor activity; FVIII - factor VIII activity. MEDICAL DIRECTOR: For questions regarding panel interpretation, please contact Jake Bathe, M.D. at [facility]  at [phone]. ------------------------------- DISCLAIMER These assessments and interpretations are provided as a convenience in support of the physician-patient relationship and are not intended to replace the physician's clinical judgment. They are derived from national guidelines in addition to other evide                          nce and expert opinion. The clinician should consider this information within the context of clinical opinion and the individual patient. SEE GUIDANCE FOR VON WILLEBRAND FACTOR ASSESSMENT: (1) The National Heart, Lung and Blood Institute. The Diagnosis, Evaluation and Management of von Willebrand Disease. Janeal Holmes, MD: [place] Publication 03-6978. 4801. Available at vSpecials.com.pt. (2) Daryl Eastern et al. [place] J Hematol. 2009; 84(6):366-370. (3) [place]. 2004;10(3):199-217. (4) Pasi KJ et al. Haemophilia. 2004; 10(3):218-231. Performed At: [facility] [place], [place] [id number] Thomasene Ripple MD [id number]   Appointment on 11/26/2020  Component Date Value Ref Range Status  . Anticardiolipin IgG 11/26/2020 <9  0 - 14 GPL U/mL Final   Comment: (NOTE)                          Negative:              <15  Indeterminate:     15 - 20                          Low-Med Positive: >20 - 80                          High Positive:         >80   . Anticardiolipin IgM 11/26/2020 <9  0 - 12 MPL U/mL Final   Comment: (NOTE)                          Negative:              <13                          Indeterminate:     13 - 20                          Low-Med Positive: >20 - 80                          High Positive:         >80   . Anticardiolipin IgA 11/26/2020 <9  0 - 11 APL U/mL Final   Comment: (NOTE)                          Negative:              <12                          Indeterminate:     12 - 20                           Low-Med Positive: >20 - 80                          High Positive:         >80 Performed At: [facility] [address] [place], [place] [id number] Rush Farmer MD [id number]   . Recommendations-PTGENE: 11/26/2020 LA11   Final   Comment: (NOTE) Test not performed. Deterioration occurred during specimen handling.      Rolland Porter was notified 11/27/2020. Additional Clinical Information: Venous thromboembolism is a multifactorial disease influenced by genetic, environmental, and circumstantial risk factors. The c.*97G>A variant in the F2 gene is a genetic risk factor for venous thromboembolism. Heterozygous carriers have a 2- to 4-fold increased risk for venous thromboembolism. Homozygotes for the c.*97G>A variant are rare. The annual risk of VTE in homozygotes has been reported to be 1.1%/year. Individuals who carry both a c.*97G>A variant in the F2 gene and a c.1601G>A (p. Arg534Gln) variant in the F5 gene (commonly referred to as Factor V Leiden) have an approximately 20- fold increased risk for venous thromboembolism. Risks are likely to be even higher in more complex genotype combinations involving the F2 c.*97G>A variant and Factor V Leiden (PMID: 49201007). Additional risk factors include but are                           not limited to: deficiency of protein C, protein  S, or antithrombin III, age, female sex, personal or family history of deep vein thromboembolism, smoking, surgery, prolonged immobilization, malignant neoplasm, tamoxifen treatment, raloxifene treatment, oral contraceptive use, hormone replacement therapy, and pregnancy. Management of thrombotic risk and thrombotic events should follow established guidelines and fit the clinical circumstance. This result cannot predict the occurrence or recurrence of a thrombotic event. Comments: Genetic counseling is recommended to discuss the potential clinical implications of positive results, as well as  recommendations for testing family members. Genetic Coordinators are available for health care providers to discuss results at [phone]). Test Details: Variant analyzed: c.*97G>A, previously referred to as G20210A Methods/Limitations: DNA analysis of the F2 gene (NM_000506.5) was performed by PCR amplification followed                           by restriction enzyme analysis. The diagnostic sensitivity is >99%. Results must be combined with clinical information for the most accurate interpretation. Molecular-based testing is highly accurate, but as in any laboratory test, diagnostic errors may occur. False positive or false negative results may occur for reasons that include genetic variants, blood transfusions, bone marrow transplantation, somatic or tissue-specific mosaicism, mislabeled samples, or erroneous representation of family relationships. This test was developed and its performance characteristics determined by [facility]. It has not been cleared or approved by the Food and Drug Administration. References: [facility], Laurann Montana [facility]; [facility] Professional Practice and Guidelines Committee. Addendum: [place] consensus statement on factor V Leiden mutation testing. Genet Med. 2021 Mar 5. doi: 10.1038/[id number]-x. PMID: 53299242. Kristopher Oppenheim. Proth                          rombin Thrombophilia. 2006 Jul 25 [Updated 2021 Feb 4]. In: Tarri Glenn, Ardinger HH, Pagon RA, et al., editors. GeneReviews(R) [Internet]. [address] ([place]): [place], [place]; 1993-2021. Available from: https://www.cook-brown.com/ Terrilee Files, Carla Drape, Marin Shutter CS; [facility] Laboratory Quality Assurance Committee. Venous thromboembolism laboratory testing (factor V Leiden and factor II c.*97G>A), 2018 update: a technical standard of the [place]  ([facility]). Genet Med. 2018 Dec;20(12):1489-1498. doi: 68.3419/[id number]. Epub 2018 Oct 5. PMID: 17408144. Allison Quarry, PhD, [facility] Ruben Reason, PhD, [facility] Earlean Polka, PhD, [facility] Threasa Alpha, PhD, [facility] W Gailen Shelter, PhD, [facility] Alfredo Bach, PhD, [facility] Performed At: [facility] [address] [place], [place] [id number] Katina Degree MDPhD FW:[id number]   . Recommendations-F5LEID: 11/26/2020 LA11   Final   Comment: (NOTE) Test not performed. Deterioration occurred during specimen handling.      Rolland Porter was notified 11/27/2020. Additional Clinical Information: Venous thromboembolism is a multifactorial disease influenced by genetic, environmental, and circumstantial risk factors. The c.1601G>A (p. Arg534Gln) variant in the F5 gene, commonly referred to as Factor V Leiden, is a genetic risk factor for venous thromboembolism. Heterozygous carriers of this variant have a 6- to 8- fold increased risk for venous thromboembolism. Individuals homozygous for this variant (ie, with a copy of the variant on each chromosome) have an approximately 80-fold increased risk for venous thromboembolism. Individuals who carry both a c.*97G>A variant in the F2 gene and Factor V Leiden have an approximately 20-fold increased risk for venous thromboembolism. Risks are likely to be even higher in more complex genotype combinations involving the F2 c.*97G>A variant and Factor V Leiden (PMID: [phone]  7). Additional risk factors include but are not limited to: deficiency of protein C, protein S, or antithrombin III, age, female sex, personal or family history of deep vein thromboembolism, smoking, surgery, prolonged immobilization, malignant neoplasm, tamoxifen treatment, raloxifene treatment, oral contraceptive use, hormone replacement therapy, and pregnancy. Management of thrombotic risk and thrombotic events should follow established guidelines  and fit the clinical circumstance. This result cannot predict the occurrence or recurrence of a thrombotic event. Comment: Genetic counseling is recommended to discuss the potential clinical implications of positive results, as well as recommendations for testing family members. Genetic Coordinators are available for health care providers to discuss results at [phone]). Test Details: Variant Analyzed: c.1601G>A (p. Arg534Gln), referred to as Factor V Leiden Methods/Limitations: DNA analysis of the F5 gene (NM                          _000130.5) was performed by PCR amplification followed by restriction enzyme analysis. The diagnostic sensitivity is >99%. Results must be combined with clinical information for the most accurate interpretation. Molecular- based testing is highly accurate, but as in any laboratory test, diagnostic errors may occur. False positive or false negative results may occur for reasons that include genetic variants, blood transfusions, bone marrow transplantation, somatic or tissue-specific mosaicism, mislabeled samples, or erroneous representation of family relationships. This test was developed and its performance characteristics determined by [facility]. It has not been cleared or approved by the Food and Drug Administration. References: [facility], Laurann Montana [facility]; [facility] Professional Practice and Guidelines Committee. Addendum: [place] consensus statement on factor V Leiden mutation testing. Genet Med. 2021 Mar 5. doi: 10.1038                          /[id number]. PMID: 06237628. Kristopher Oppenheim. Factor V Leiden Thrombophilia. 1999 May 14 [Updated 2018 Jan 4]. In: Tarri Glenn, Ardinger HH, Pagon RA, et al., editors. GeneReviews(R) [Internet]. [address] ([place]): [place], ; 1993-2021. Available from: MortgageHole.tn Terrilee Files, Carla Drape, Marin Shutter CS; [facility] Laboratory Quality Assurance Committee. Venous thromboembolism laboratory testing (factor V Leiden and factor II c.*97G>A), 2018 update: a technical standard of the [place] ([facility]). Genet Med. 2018 Dec;20(12):1489-1498. doi: 31.5176/[id number]. Epub 2018 Oct 5. PMID: 54627035. Allison Quarry, PhD, [facility] Ruben Reason, PhD, [facility] Earlean Polka, PhD, [facility] Threasa Alpha, PhD, [facility] W Gailen Shelter, PhD, [facility] Alfredo Bach, PhD, [facility] Performed At: [facility] [address] [place], [place] [id number] Katina Degree MDPhD Ph                          :[phone]   . Homocysteine 11/26/2020 10.1  0.0 - 19.2 umol/L Final   Comment: (NOTE) Performed At: [facility] [place], [place] [id number] Rush Farmer MD [id number]   . Beta-2 Glyco I IgG 11/26/2020 <9  0 - 20 GPI IgG units Final   Comment: (NOTE) The reference interval reflects a 3SD or 99th percentile interval, which is thought to represent a potentially clinically significant result in accordance with the International Consensus Statement on the classification criteria for definitive antiphospholipid syndrome (APS). J Thromb Haem 2006;4:295-306.   . Beta-2-Glycoprotein I IgM 11/26/2020 <9  0 - 32 GPI IgM units Final   Comment: (NOTE)  The reference interval reflects a 3SD or 99th percentile interval, which is thought to represent a potentially clinically significant result in accordance with the International Consensus Statement on the classification criteria for definitive antiphospholipid syndrome (APS). J Thromb Haem 2006;4:295-306. Performed At: [facility] [place], [place] [id number] Rush Farmer MD XT:[id number]   . Beta-2-Glycoprotein I IgA 11/26/2020 <9  0 - 25 GPI IgA units Final   Comment: (NOTE) The reference interval reflects a 3SD or 99th percentile  interval, which is thought to represent a potentially clinically significant result in accordance with the International Consensus Statement on the classification criteria for definitive antiphospholipid syndrome (APS). J Thromb Haem 2006;4:295-306.   Marland Kitchen PTT Lupus Anticoagulant 11/26/2020 33.0  0.0 - 51.9 sec Final  . DRVVT 11/26/2020 40.5  0.0 - 47.0 sec Final  . Lupus Anticoag Interp 11/26/2020 Comment:   Corrected   Comment: (NOTE) No lupus anticoagulant was detected. Performed At: [facility] [place], [place] [id number] Rush Farmer MD HD:[id number]   . Protein S Ag, Total 11/26/2020 83  60 - 150 % Final   Comment: (NOTE) This test was developed and its performance characteristics determined by [facility]. It has not been cleared or approved by the Food and Drug Administration. Performed At: [facility] [place], [place] [id number] Rush Farmer MD [id number]   . Protein S Activity 11/26/2020 86  63 - 140 % Final   Comment: (NOTE) Protein S activity may be falsely increased (masking an abnormal, low result) in patients receiving direct Xa inhibitor (e.g., rivaroxaban, apixaban, edoxaban) or a direct thrombin inhibitor (e.g., dabigatran) anticoagulant treatment due to assay interference by these drugs. Performed At: [facility] [place], [place] [id number] Rush Farmer MD [id number]   . Protein C, Total 11/26/2020 130  60 - 150 % Final   Comment: (NOTE) Performed At: [facility] [place], [place] [id number] Rush Farmer MD SJ:[id number]   . Protein C Activity 11/26/2020 149  73 - 180 % Final   Comment: (NOTE) Performed At: [facility] [place], [place] [id number] Rush Farmer MD SF:[id number]   . AntiThromb III Func 11/26/2020 97  75 - 120 % Final   Performed at [facility] [address] [place], [place] [address]     X-Rays:DG  Knee 2 Views Left  Result Date: 01/06/2021 CLINICAL DATA:  Status post left total knee replacement. EXAM: LEFT KNEE - 1-2 VIEW COMPARISON:  None. FINDINGS: The left femoral and tibial components are well situated. Expected postoperative changes are noted in the soft tissues anteriorly. IMPRESSION: Status post left total knee arthroplasty. Electronically Signed   By: Marijo Conception M.D.   On: 01/06/2021 12:24    EKG: Orders placed or performed during the hospital encounter of 12/30/20  . EKG 12 lead per protocol  . EKG 12 lead per protocol     Hospital Course: Jessica Bautista is a 80 y.o. who was admitted to [facility]. They were brought to the operating room on 01/06/2021 and underwent Procedure(s): TOTAL KNEE ARTHROPLASTY.  Patient tolerated the procedure well and was later transferred to the recovery room and then to the orthopaedic floor for postoperative care.  They were given PO and IV analgesics for pain control following their surgery.  They were given 24 hours of postoperative antibiotics of  Anti-infectives (From admission, onward)   Start     Dose/Rate Route Frequency Ordered Stop  01/06/21 1500  ceFAZolin (ANCEF) IVPB 2g/100 mL premix        2 g 200 mL/hr over 30 Minutes Intravenous Every 6 hours 01/06/21 1250 01/07/21 0236   01/06/21 0630  ceFAZolin (ANCEF) IVPB 2g/100 mL premix        2 g 200 mL/hr over 30 Minutes Intravenous On call to O.R. 01/06/21 6195 01/06/21 0902     and started on DVT prophylaxis in the form of Xarelto, TED hose and SCDs.   PT and OT were ordered for total joint protocol.  Discharge planning consulted to help with postop disposition and equipment needs.  Patient had a fair night on the evening of surgery.  They started to get up OOB with therapy on day one. Continued to work with therapy into day two.   By day two, the patient had progressed with therapy and meeting their goals.  Incision was healing well.  Patient was seen in rounds and was ready to  go home.   Diet: Regular diet Activity:WBAT Follow-up:in 10-14 days Disposition - Home Discharged Condition: good   Discharge Instructions    Call MD / Call 911   Complete by: As directed    If you experience chest pain or shortness of breath, CALL 911 and be transported to the hospital emergency room.  If you develope a fever above 101 F, pus (white drainage) or increased drainage or redness at the wound, or calf pain, call your surgeon's office.   Constipation Prevention   Complete by: As directed    Drink plenty of fluids.  Prune juice may be helpful.  You may use a stool softener, such as Colace (over the counter) 100 mg twice a day.  Use MiraLax (over the counter) for constipation as needed.   Diet - low sodium heart healthy   Complete by: As directed    Increase activity slowly as tolerated   Complete by: As directed      Allergies as of 01/08/2021      Reactions   Tape Itching, Rash   Adhesive Tape   Wound Dressing Adhesive Itching   Hrt Support [a-g Pro]    History of pe and dvt      Medication List    STOP taking these medications   b complex vitamins capsule   diclofenac Sodium 1 % Gel Commonly known as: VOLTAREN   ibuprofen 200 MG tablet Commonly known as: ADVIL   metoprolol tartrate 50 MG tablet Commonly known as: LOPRESSOR   naproxen sodium 220 MG tablet Commonly known as: ALEVE   RED YEAST RICE PO   rosuvastatin 5 MG tablet Commonly known as: CRESTOR     TAKE these medications   CALCIUM + D PO Take 1 tablet by mouth daily.   cetirizine 10 MG tablet Commonly known as: ZYRTEC Take 10 mg by mouth daily as needed for allergies.   citalopram 20 MG tablet Commonly known as: CELEXA Take 1 tablet (20 mg total) by mouth daily.   clobetasol ointment 0.05 % Commonly known as: TEMOVATE Applied three times a week What changed:   how much to take  how to take this  when to take this  reasons to take this  additional instructions    docusate sodium 100 MG capsule Commonly known as: Colace Take 1 capsule (100 mg total) by mouth 2 (two) times daily as needed for mild constipation.   HYDROcodone-acetaminophen 5-325 MG tablet Commonly known as: NORCO/VICODIN Take 1 tablet by mouth every 4 (four)  hours as needed for moderate pain (pain score 4-6).   levothyroxine 88 MCG tablet Commonly known as: SYNTHROID Take 88 mcg by mouth daily before breakfast.   polyethylene glycol 17 g packet Commonly known as: MIRALAX / GLYCOLAX Take 17 g by mouth daily.   rivaroxaban 10 MG Tabs tablet Commonly known as: Xarelto Take 1 tablet (10 mg total) by mouth daily.   VITAMIN D-3 PO Take 2,000 Units by mouth daily.   zolpidem 10 MG tablet Commonly known as: AMBIEN Take by mouth at bedtime as needed for sleep.            Durable Medical Equipment  (From admission, onward)         Start     Ordered   01/06/21 1251  DME Walker rolling  Once       Question Answer Comment  Walker: With 5 Inch Wheels   Patient needs a walker to treat with the following condition Pain      01/06/21 1250   01/06/21 1251  DME 3 n 1  Once        01/06/21 1250          Follow-up Information    Susa Day, MD. Go on 01/20/2021.   Specialty: Orthopedic Surgery Why: You are scheduled for first post op appointment on Wednesday March 16th at 1:30pm. Contact information: [address] [place] [address] [phone]               Signed: Lacie Draft, PA-C Orthopaedic Surgery 01/08/2021, 9:36 AM

## 2021-01-08 NOTE — Progress Notes (Signed)
Subjective: 2 Days Post-Op Procedure(s) (LRB): TOTAL KNEE ARTHROPLASTY (Left) Patient reports pain as mild.    Objective: Vital signs in last 24 hours: Temp:  [97.7 F (36.5 C)-98.9 F (37.2 C)] 98.7 F (37.1 C) (03/04 0602) Pulse Rate:  [54-65] 65 (03/04 0602) Resp:  [16] 16 (03/04 0602) BP: (113-146)/(59-72) 127/61 (03/04 0602) SpO2:  [94 %-98 %] 94 % (03/04 0602)  Intake/Output from previous day: 03/03 0701 - 03/04 0700 In: 109 [I.V.:109] Out: -  Intake/Output this shift: No intake/output data recorded.  Recent Labs    01/07/21 0307 01/08/21 0301  HGB 9.8* 9.1*   Recent Labs    01/07/21 0307 01/08/21 0301  WBC 14.3* 8.4  RBC 3.11* 2.83*  HCT 31.1* 28.4*  PLT 272 229   Recent Labs    01/07/21 0307  NA 139  K 4.0  CL 105  CO2 28  BUN 14  CREATININE 0.71  GLUCOSE 157*  CALCIUM 8.4*   No results for input(s): LABPT, INR in the last 72 hours.  Neurologically intact ABD soft Neurovascular intact Sensation intact distally Intact pulses distally Dorsiflexion/Plantar flexion intact Incision: dressing C/D/I No cellulitis present Compartment soft no calf pain or sign of DVT   Assessment/Plan: 2 Days Post-Op Procedure(s) (LRB): TOTAL KNEE ARTHROPLASTY (Left) Advance diet Up with therapy D/C IV fluids  D/C to home OUtpt PT scheduled Xarelto for DVT ppx due to prior DVT/PE Discussed with Dr Tonita Cong   Cecilie Kicks 01/08/2021, 9:30 AM

## 2021-01-08 NOTE — Care Management Important Message (Signed)
Important Message  Patient Details IM Letter given to the Patient. Name: Jessica Bautista MRN: [id number] Date of Birth: 10-25-1941   Medicare Important Message Given:  Yes     Kerin Salen 01/08/2021, 11:31 AM

## 2021-01-08 NOTE — Plan of Care (Signed)
  Problem: Education: Goal: Knowledge of the prescribed therapeutic regimen will improve Outcome: Progressing   Problem: Pain Management: Goal: Pain level will decrease with appropriate interventions Outcome: Progressing   

## 2021-01-08 NOTE — Plan of Care (Signed)
  Problem: Education: Goal: Knowledge of the prescribed therapeutic regimen will improve Outcome: Progressing   Problem: Pain Management: Goal: Pain level will decrease with appropriate interventions Outcome: Progressing   Problem: Activity: Goal: Risk for activity intolerance will decrease Outcome: Progressing   

## 2021-01-08 NOTE — Progress Notes (Addendum)
Physical Therapy Treatment Patient Details Name: Jessica Bautista MRN: [id number] DOB: 06-Oct-1941 Today's Date: 01/08/2021    History of Present Illness Jessica Bautista is an 80 yo female with past medical history significant for brease cancer, HTN, osteopenia, DVT and PE currently s/p L TKA 01/06/21.    PT Comments    Pt continues very cooperative but limited by fatigue/pain.  Pt up to ambulate limited distance in hall and struggled to negotiate 2 steps with rail and crutch.  Reviewed don KI with pt and sister  Follow Up Recommendations  Follow surgeon's recommendation for DC plan and follow-up therapies     Equipment Recommendations  None recommended by PT    Recommendations for Other Services       Precautions / Restrictions Precautions Precautions: Fall;Knee Required Braces or Orthoses: Knee Immobilizer - Left Knee Immobilizer - Left: Discontinue once straight leg raise with < 10 degree lag Restrictions Weight Bearing Restrictions: Yes Other Position/Activity Restrictions: WBAT    Mobility  Bed Mobility Overal bed mobility: Needs Assistance Bed Mobility: Sit to Supine     Supine to sit: Min assist;Mod assist Sit to supine: Min assist;Mod assist   General bed mobility comments: increased time with cues for sequence and use of R LE to self assist; Physical assist to manage R LE and to bring trunk to upright    Transfers Overall transfer level: Needs assistance Equipment used: Rolling walker (2 wheeled) Transfers: Sit to/from Stand Sit to Stand: Min assist         General transfer comment: cues for LE management and use of UEs to self assist  Ambulation/Gait Ambulation/Gait assistance: Min assist Gait Distance (Feet): 60 Feet Assistive device: Rolling walker (2 wheeled) Gait Pattern/deviations: Step-to pattern;Decreased stance time - left;Decreased weight shift to left;Shuffle;Trunk flexed Gait velocity: decr   General Gait Details: cues for sequence, posture,  position from RW;   Stairs Stairs: Yes Stairs assistance: Min assist;Mod assist Stair Management: One rail Left;Step to pattern;Forwards;With crutches Number of Stairs: 2 General stair comments: Increased time with cues for sequence and foot/crutch placement.  Attempted with QC but did not offer enough support.   Wheelchair Mobility    Modified Rankin (Stroke Patients Only)       Balance Overall balance assessment: Needs assistance Sitting-balance support: Feet supported;No upper extremity supported Sitting balance-Leahy Scale: Good     Standing balance support: Bilateral upper extremity supported Standing balance-Leahy Scale: Poor                              Cognition Arousal/Alertness: Awake/alert Behavior During Therapy: WFL for tasks assessed/performed Overall Cognitive Status: Within Functional Limits for tasks assessed                                        Exercises Total Joint Exercises Ankle Circles/Pumps: AROM;Both;20 reps;Supine Quad Sets: AROM;Both;Supine;15 reps Heel Slides: AAROM;Left;15 reps;Supine Straight Leg Raises: Left;Supine;10 reps;AAROM    General Comments        Pertinent Vitals/Pain Pain Assessment: 0-10 Pain Score: 5  Pain Location: L knee Pain Descriptors / Indicators: Aching;Sore Pain Intervention(s): Limited activity within patient's tolerance;Monitored during session;Premedicated before session;Ice applied    Home Living                      Prior Function  PT Goals (current goals can now be found in the care plan section) Acute Rehab PT Goals Patient Stated Goal: home with sister to assist PT Goal Formulation: With patient Time For Goal Achievement: 01/20/21 Potential to Achieve Goals: Good Progress towards PT goals: Progressing toward goals    Frequency    7X/week      PT Plan Current plan remains appropriate    Co-evaluation              AM-PAC PT "6  Clicks" Mobility   Outcome Measure  Help needed turning from your back to your side while in a flat bed without using bedrails?: A Little Help needed moving from lying on your back to sitting on the side of a flat bed without using bedrails?: A Little Help needed moving to and from a bed to a chair (including a wheelchair)?: A Little Help needed standing up from a chair using your arms (e.g., wheelchair or bedside chair)?: A Little Help needed to walk in hospital room?: A Little Help needed climbing 3-5 steps with a railing? : A Lot 6 Click Score: 17    End of Session Equipment Utilized During Treatment: Gait belt Activity Tolerance: Patient limited by fatigue Patient left: in bed;with call bell/phone within reach;with family/visitor present Nurse Communication: Mobility status PT Visit Diagnosis: Muscle weakness (generalized) (M62.81);Difficulty in walking, not elsewhere classified (R26.2);Pain Pain - Right/Left: Left Pain - part of body: Knee     Time: 1440-1510 PT Time Calculation (min) (ACUTE ONLY): 30 min  Charges:  $Gait Training: 8-22 mins $Therapeutic Exercise: 8-22 mins $Therapeutic Activity: 8-22 mins                     Debe Coder PT Acute Rehabilitation Services Pager [phone] Office [phone]    BRADSHAW,HUNTER 01/08/2021, 3:22 PM

## 2021-01-08 NOTE — Progress Notes (Signed)
Physical Therapy Treatment Patient Details Name: Jessica Bautista MRN: [id number] DOB: 19-Jul-1941 Today's Date: 01/08/2021    History of Present Illness Jessica Bautista is an 80 yo female with past medical history significant for brease cancer, HTN, osteopenia, DVT and PE currently s/p L TKA 01/06/21.    PT Comments    Pt continues cooperative but limited this am by fatigue and increased pain.   Follow Up Recommendations  Follow surgeon's recommendation for DC plan and follow-up therapies     Equipment Recommendations  None recommended by PT    Recommendations for Other Services       Precautions / Restrictions Precautions Precautions: Fall;Knee Required Braces or Orthoses: Knee Immobilizer - Left Knee Immobilizer - Left: Discontinue once straight leg raise with < 10 degree lag Restrictions Weight Bearing Restrictions: No Other Position/Activity Restrictions: WBAT    Mobility  Bed Mobility Overal bed mobility: Needs Assistance Bed Mobility: Supine to Sit     Supine to sit: Min assist;Mod assist     General bed mobility comments: increased time with cues for sequence and use of R LE to self assist; Physical assist to manage R LE and to bring trunk to upright    Transfers Overall transfer level: Needs assistance Equipment used: Rolling walker (2 wheeled) Transfers: Sit to/from Stand Sit to Stand: Min assist         General transfer comment: cues for LE management and use of UEs to self assist  Ambulation/Gait Ambulation/Gait assistance: Min assist Gait Distance (Feet): 65 Feet Assistive device: Rolling walker (2 wheeled) Gait Pattern/deviations: Step-to pattern;Decreased stance time - left;Decreased weight shift to left;Shuffle;Trunk flexed Gait velocity: decr   General Gait Details: cues for sequence, posture, position from RW; no buckling noted at R knee this session   Stairs             Wheelchair Mobility    Modified Rankin (Stroke Patients  Only)       Balance Overall balance assessment: Needs assistance Sitting-balance support: Feet supported;No upper extremity supported Sitting balance-Leahy Scale: Good     Standing balance support: Bilateral upper extremity supported Standing balance-Leahy Scale: Poor                              Cognition Arousal/Alertness: Awake/alert Behavior During Therapy: WFL for tasks assessed/performed Overall Cognitive Status: Within Functional Limits for tasks assessed                                        Exercises Total Joint Exercises Ankle Circles/Pumps: AROM;Both;20 reps;Supine Quad Sets: AROM;Both;Supine;15 reps Heel Slides: AAROM;Left;15 reps;Supine Straight Leg Raises: Left;Supine;10 reps;AAROM    General Comments        Pertinent Vitals/Pain Pain Assessment: 0-10 Pain Score: 6  Pain Location: L knee Pain Descriptors / Indicators: Aching;Sore Pain Intervention(s): Limited activity within patient's tolerance;Monitored during session;Premedicated before session;Ice applied    Home Living                      Prior Function            PT Goals (current goals can now be found in the care plan section) Acute Rehab PT Goals Patient Stated Goal: home with sister to assist PT Goal Formulation: With patient Time For Goal Achievement: 01/20/21 Potential to Achieve Goals: Good Progress towards PT  goals: Not progressing toward goals - comment (increased pain and fatigue)    Frequency    7X/week      PT Plan Current plan remains appropriate    Co-evaluation              AM-PAC PT "6 Clicks" Mobility   Outcome Measure  Help needed turning from your back to your side while in a flat bed without using bedrails?: A Little Help needed moving from lying on your back to sitting on the side of a flat bed without using bedrails?: A Little Help needed moving to and from a bed to a chair (including a wheelchair)?: A  Little Help needed standing up from a chair using your arms (e.g., wheelchair or bedside chair)?: A Little Help needed to walk in hospital room?: A Little Help needed climbing 3-5 steps with a railing? : A Lot 6 Click Score: 17    End of Session Equipment Utilized During Treatment: Gait belt Activity Tolerance: Patient limited by fatigue Patient left: in chair;with call bell/phone within reach;with chair alarm set;with family/visitor present Nurse Communication: Mobility status PT Visit Diagnosis: Muscle weakness (generalized) (M62.81);Difficulty in walking, not elsewhere classified (R26.2);Pain Pain - Right/Left: Left Pain - part of body: Knee     Time: 2482-5003 PT Time Calculation (min) (ACUTE ONLY): 37 min  Charges:  $Gait Training: 8-22 mins $Therapeutic Exercise: 8-22 mins                     [place] Pager [phone] Office [phone]    Chandria Rookstool 01/08/2021, 3:16 PM

## 2021-01-08 NOTE — Plan of Care (Signed)
Plan of care reviewed and discussed with the patient. 

## 2021-01-09 LAB — CBC
HCT: 28.4 % — ABNORMAL LOW (ref 36.0–46.0)
Hemoglobin: 8.7 g/dL — ABNORMAL LOW (ref 12.0–15.0)
MCH: 31.2 pg (ref 26.0–34.0)
MCHC: 30.6 g/dL (ref 30.0–36.0)
MCV: 101.8 fL — ABNORMAL HIGH (ref 80.0–100.0)
Platelets: 223 10*3/uL (ref 150–400)
RBC: 2.79 MIL/uL — ABNORMAL LOW (ref 3.87–5.11)
RDW: 13 % (ref 11.5–15.5)
WBC: 8.9 10*3/uL (ref 4.0–10.5)
nRBC: 0 % (ref 0.0–0.2)

## 2021-01-09 NOTE — Plan of Care (Signed)
  Problem: Education: Goal: Knowledge of the prescribed therapeutic regimen will improve Outcome: Progressing   Problem: Pain Management: Goal: Pain level will decrease with appropriate interventions Outcome: Progressing   Problem: Activity: Goal: Risk for activity intolerance will decrease Outcome: Progressing   

## 2021-01-09 NOTE — Progress Notes (Signed)
Patient has DME walker in car.

## 2021-01-09 NOTE — Progress Notes (Signed)
Physical Therapy Treatment Patient Details Name: Jessica Bautista MRN: [id number] DOB: [birth year] Today's Date: 01/09/2021    History of Present Illness Jessica Bautista is an 80 yo female with past medical history significant for brease cancer, HTN, osteopenia, DVT and PE currently s/p L TKA 01/06/21.    PT Comments    Pt in good spirits and reports feeling much better today.  Pt performed HEP with assist.  Stair training deferred to arrival of sister.   Follow Up Recommendations  Follow surgeon's recommendation for DC plan and follow-up therapies     Equipment Recommendations  None recommended by PT    Recommendations for Other Services       Precautions / Restrictions Precautions Precautions: Fall;Knee Required Braces or Orthoses: Knee Immobilizer - Left Knee Immobilizer - Left: Discontinue once straight leg raise with < 10 degree lag Restrictions Weight Bearing Restrictions: No Other Position/Activity Restrictions: WBAT    Mobility  Bed Mobility                    Transfers                    Ambulation/Gait                 Stairs             Wheelchair Mobility    Modified Rankin (Stroke Patients Only)       Balance                                            Cognition Arousal/Alertness: Awake/alert Behavior During Therapy: WFL for tasks assessed/performed Overall Cognitive Status: Within Functional Limits for tasks assessed                                        Exercises Total Joint Exercises Ankle Circles/Pumps: AROM;Both;20 reps;Supine Quad Sets: AROM;Both;Supine;15 reps Heel Slides: AAROM;Left;15 reps;Supine Hip ABduction/ADduction: AAROM;Left;15 reps;Supine Straight Leg Raises: Left;Supine;AAROM;20 reps Goniometric ROM: AAROM at L knee -5 - 45    General Comments        Pertinent Vitals/Pain Pain Assessment: 0-10 Pain Score: 5  Pain Location: L knee Pain Descriptors /  Indicators: Aching;Sore Pain Intervention(s): Limited activity within patient's tolerance;Monitored during session;Premedicated before session;Ice applied    Home Living                      Prior Function            PT Goals (current goals can now be found in the care plan section) Acute Rehab PT Goals Patient Stated Goal: home with sister to assist PT Goal Formulation: With patient Time For Goal Achievement: 01/20/21 Potential to Achieve Goals: Good Progress towards PT goals: Progressing toward goals    Frequency    7X/week      PT Plan Current plan remains appropriate    Co-evaluation              AM-PAC PT "6 Clicks" Mobility   Outcome Measure  Help needed turning from your back to your side while in a flat bed without using bedrails?: A Little Help needed moving from lying on your back to sitting on the side of a flat bed without using bedrails?: A Little  Help needed moving to and from a bed to a chair (including a wheelchair)?: A Little Help needed standing up from a chair using your arms (e.g., wheelchair or bedside chair)?: A Little Help needed to walk in hospital room?: A Little Help needed climbing 3-5 steps with a railing? : A Lot 6 Click Score: 17    End of Session         PT Visit Diagnosis: Muscle weakness (generalized) (M62.81);Difficulty in walking, not elsewhere classified (R26.2);Pain Pain - Right/Left: Left Pain - part of body: Knee     Time: 3212-2482 PT Time Calculation (min) (ACUTE ONLY): 22 min  Charges:  $Therapeutic Exercise: 8-22 mins                     Debe Coder PT Acute Rehabilitation Services Pager [phone] Office [phone]    BRADSHAW,HUNTER 01/09/2021, 12:09 PM

## 2021-01-09 NOTE — Progress Notes (Signed)
Physical Therapy Treatment Patient Details Name: Jessica Bautista MRN: [id number] DOB: 01-May-1941 Today's Date: 01/09/2021    History of Present Illness Jessica Bautista is an 80 yo female with past medical history significant for brease cancer, HTN, osteopenia, DVT and PE currently s/p L TKA 01/06/21.    PT Comments    Pt with marked improvement in activity tolerance this date.  Pt up to ambulate increased distance in hall an negotiated stairs with rail and QC.  Sister present and written instruction provided.   Follow Up Recommendations  Follow surgeon's recommendation for DC plan and follow-up therapies     Equipment Recommendations  None recommended by PT    Recommendations for Other Services       Precautions / Restrictions Precautions Precautions: Fall;Knee Required Braces or Orthoses: Knee Immobilizer - Left Knee Immobilizer - Left: Discontinue once straight leg raise with < 10 degree lag Restrictions Weight Bearing Restrictions: No Other Position/Activity Restrictions: WBAT    Mobility  Bed Mobility               General bed mobility comments: Pt up in chair and requests back to same    Transfers Overall transfer level: Needs assistance Equipment used: Rolling walker (2 wheeled) Transfers: Sit to/from Stand Sit to Stand: Min guard;Supervision         General transfer comment: cues for LE management and use of UEs to self assist  Ambulation/Gait Ambulation/Gait assistance: Min guard;Supervision Gait Distance (Feet): 110 Feet Assistive device: Rolling walker (2 wheeled) Gait Pattern/deviations: Step-to pattern;Decreased stance time - left;Decreased weight shift to left;Shuffle;Trunk flexed Gait velocity: decr   General Gait Details: cues for sequence, posture, position from RW;   Stairs Stairs: Yes Stairs assistance: Min assist Stair Management: One rail Left;Step to pattern;Forwards;With cane (quad cane) Number of Stairs: 3 General stair comments:  Increased time with cues for sequence and foot/QC placement.   Wheelchair Mobility    Modified Rankin (Stroke Patients Only)       Balance Overall balance assessment: Needs assistance Sitting-balance support: Feet supported;No upper extremity supported Sitting balance-Leahy Scale: Good     Standing balance support: No upper extremity supported Standing balance-Leahy Scale: Fair                              Cognition Arousal/Alertness: Awake/alert Behavior During Therapy: WFL for tasks assessed/performed Overall Cognitive Status: Within Functional Limits for tasks assessed                                        Exercises Total Joint Exercises Ankle Circles/Pumps: AROM;Both;20 reps;Supine Quad Sets: AROM;Both;Supine;15 reps Heel Slides: AAROM;Left;15 reps;Supine Hip ABduction/ADduction: AAROM;Left;15 reps;Supine Straight Leg Raises: Left;Supine;AAROM;20 reps Goniometric ROM: AAROM at L knee -5 - 45    General Comments        Pertinent Vitals/Pain Pain Assessment: 0-10 Pain Score: 4  Pain Location: L knee Pain Descriptors / Indicators: Aching;Sore Pain Intervention(s): Limited activity within patient's tolerance;Monitored during session;Premedicated before session;Ice applied    Home Living                      Prior Function            PT Goals (current goals can now be found in the care plan section) Acute Rehab PT Goals Patient Stated Goal: home with sister  to assist PT Goal Formulation: With patient Time For Goal Achievement: 01/20/21 Potential to Achieve Goals: Good Progress towards PT goals: Progressing toward goals    Frequency    7X/week      PT Plan Current plan remains appropriate    Co-evaluation              AM-PAC PT "6 Clicks" Mobility   Outcome Measure  Help needed turning from your back to your side while in a flat bed without using bedrails?: A Little Help needed moving from lying on  your back to sitting on the side of a flat bed without using bedrails?: A Little Help needed moving to and from a bed to a chair (including a wheelchair)?: A Little Help needed standing up from a chair using your arms (e.g., wheelchair or bedside chair)?: A Little Help needed to walk in hospital room?: A Little Help needed climbing 3-5 steps with a railing? : A Little 6 Click Score: 18    End of Session Equipment Utilized During Treatment: Gait belt;Left knee immobilizer Activity Tolerance: Patient tolerated treatment well Patient left: in chair;with call bell/phone within reach;with family/visitor present Nurse Communication: Mobility status PT Visit Diagnosis: Muscle weakness (generalized) (M62.81);Difficulty in walking, not elsewhere classified (R26.2);Pain Pain - Right/Left: Left Pain - part of body: Knee     Time: 1015-1040 PT Time Calculation (min) (ACUTE ONLY): 25 min  Charges:  $Gait Training: 8-22 mins $Therapeutic Exercise: 8-22 mins $Therapeutic Activity: 8-22 mins                     Debe Coder PT Acute Rehabilitation Services Pager [phone] Office [phone]    Beverlee Wilmarth 01/09/2021, 12:14 PM

## 2021-01-09 NOTE — Progress Notes (Signed)
Subjective: 3 Days Post-Op Procedure(s) (LRB): TOTAL KNEE ARTHROPLASTY (Left) Patient reports pain as moderate.   Getting around better than yesterday. +ambulation +void, +flatus Tolerating PO without nausea or vomiting.  Objective: Vital signs in last 24 hours: Temp:  [98 F (36.7 C)-99.1 F (37.3 C)] 98 F (36.7 C) (03/05 0654) Pulse Rate:  [70-85] 70 (03/05 0654) Resp:  [16-18] 16 (03/05 0654) BP: (126-142)/(55-59) 126/59 (03/05 0654) SpO2:  [91 %-93 %] 93 % (03/05 0654)  Intake/Output from previous day: 03/04 0701 - 03/05 0700 In: 460 [P.O.:460] Out: 91 [Urine:91] Intake/Output this shift: No intake/output data recorded.  Recent Labs    01/07/21 0307 01/08/21 0301 01/09/21 0310  HGB 9.8* 9.1* 8.7*   Recent Labs    01/08/21 0301 01/09/21 0310  WBC 8.4 8.9  RBC 2.83* 2.79*  HCT 28.4* 28.4*  PLT 229 223   Recent Labs    01/07/21 0307  NA 139  K 4.0  CL 105  CO2 28  BUN 14  CREATININE 0.71  GLUCOSE 157*  CALCIUM 8.4*   No results for input(s): LABPT, INR in the last 72 hours.  Neurologically intact ABD soft Neurovascular intact Sensation intact distally Intact pulses distally Dorsiflexion/Plantar flexion intact Incision: dressing C/D/I No cellulitis present Compartment soft   Assessment/Plan: 3 Days Post-Op Procedure(s) (LRB): TOTAL KNEE ARTHROPLASTY (Left) Advance diet Up with therapy D/C home pending PT this am. Xarelto for DVT, encourage ambulation  F/u with Dr. Tonita Cong as an out patient     Yvonne Kendall Crosby [place] 01/09/2021, 8:21 AM

## 2021-01-09 NOTE — Plan of Care (Signed)
Patient discharged, care plans complete

## 2021-01-11 ENCOUNTER — Encounter: Payer: Self-pay | Admitting: Physical Therapy

## 2021-01-11 ENCOUNTER — Ambulatory Visit: Payer: Medicare HMO | Attending: Specialist | Admitting: Physical Therapy

## 2021-01-11 ENCOUNTER — Other Ambulatory Visit: Payer: Self-pay

## 2021-01-11 DIAGNOSIS — M6281 Muscle weakness (generalized): Secondary | ICD-10-CM | POA: Insufficient documentation

## 2021-01-11 DIAGNOSIS — M25562 Pain in left knee: Secondary | ICD-10-CM | POA: Diagnosis not present

## 2021-01-11 DIAGNOSIS — R2689 Other abnormalities of gait and mobility: Secondary | ICD-10-CM | POA: Insufficient documentation

## 2021-01-11 NOTE — Therapy (Signed)
[facility] [address], [place], [place], [address] Phone: [phone]   Fax:  [phone]  Physical Therapy Evaluation  Patient Details  Name: Jessica Bautista MRN: [id number] Date of Birth: 11/14/1940 Referring Provider (PT): Susa Day, MD   Encounter Date: 01/11/2021   PT End of Session - 01/11/21 0756    Visit Number 1    Date for PT Re-Evaluation 04/05/21    Authorization Type [employer]na Medicare    Progress Note Due on Visit 10    PT Start Time 0800    PT Stop Time 0852    PT Time Calculation (min) 52 min    Activity Tolerance Patient tolerated treatment well    Behavior During Therapy [facility] for tasks assessed/performed           Past Medical History:  Diagnosis Date  . Atypical nevi   . Basal cell carcinoma   . Breast cancer ([place])   . DVT (deep venous thrombosis) ([place])   . Eczema   . Family history of colonic polyps   . Fatty (change of) liver, not elsewhere classified   . Hyperlipidemia   . Hypertension   . Hypothyroidism   . Impaired fasting glucose   . Insomnia   . Lichen sclerosus et atrophicus of the vulva   . Major depression, single episode   . Nontoxic single thyroid nodule   . Osteopenia   . Psoriasis   . Pulmonary embolus ([place]) 1999   post op tummy tuc  . Skin cancer   . Thyroid disease   . Vitamin D deficiency, unspecified   . Wears glasses     Past Surgical History:  Procedure Laterality Date  . ABDOMINOPLASTY  1998  . [employer]   right  . BREAST LUMPECTOMY Right 01/27/2014   malignant  . BREAST LUMPECTOMY WITH NEEDLE LOCALIZATION Right 01/27/2014   Procedure: BREAST LUMPECTOMY WITH NEEDLE LOCALIZATION;  Surgeon: Edward Jolly, MD;  Location: [facility];  Service: General;  Laterality: Right;  . BREAST SURGERY    . COLONOSCOPY    . EYE SURGERY Bilateral 2012  . TOTAL KNEE ARTHROPLASTY Left 01/06/2021   Procedure: TOTAL KNEE  ARTHROPLASTY;  Surgeon: Susa Day, MD;  Location[facility];  Service: Orthopedics;  Laterality: Left;  2.5 hrs  . TUBAL LIGATION    . [place]   right    There were no vitals filed for this visit.    Subjective Assessment - 01/11/21 0758    Subjective Pt is referred to OPPT and is 5 days s/p Lt TKR performed by Dr. Tonita Cong on 01/06/21.  Pre-operatively walked short distances using a knee brace but no AD.    Pertinent History osteopenia, breast CA, h/o DVT/PE    Limitations Walking;House hold activities;Standing;Sitting    How long can you sit comfortably? a couple of hours if using ice packs    How long can you stand comfortably? 5 min    How long can you walk comfortably? household distance    Patient Stated Goals be able to take a 3 mile walk    Currently in Pain? Yes    Pain Score 6     Pain Location Knee    Pain Orientation Left;Anterior;Medial;Lateral    Pain Descriptors / Indicators Sharp;Throbbing;Dull    Pain Type Surgical pain    Pain Onset More than a month ago    Pain Frequency Intermittent  Aggravating Factors  bending the knee, standing, walking, bed mobility    Pain Relieving Factors ice    Effect of Pain on Daily Activities all activity              [facility] PT Assessment - 01/11/21 0001      Assessment   Medical Diagnosis Z96.652 (ICD-10-CM) - Presence of left artificial knee joint    Referring Provider (PT) Susa Day, MD    Onset Date/Surgical Date 01/06/21    Next MD Visit 01/20/21    Prior Therapy yes      Precautions   Precautions Knee    Precaution Comments Lt TKR 01/06/21      Restrictions   Weight Bearing Restrictions No      Balance Screen   Has the patient fallen in the past 6 months No      [place] residence    Living Arrangements Alone    Type of Long to enter    Entrance Stairs-Number of Steps 5    Entrance Stairs-Rails Right    Home Layout Two  level;Able to live on main level with bedroom/bathroom    [place] - 2 wheels      Prior Function   Level of Independence Independent    Vocation Retired    Leisure card making, painting, walking, travel      Observation/Other Assessments   Observations Pt with knee high compression stockings, Lt knee ACE bandage wrapping and adhesive bandaging along anterior knee, slight warmth and erethyma present Lt knee    Focus on Therapeutic Outcomes (FOTO)  intake 37% goal 58%      ROM / Strength   AROM / PROM / Strength AROM;Strength;PROM      AROM   AROM Assessment Site Knee    Right/Left Knee Right;Left    Right Knee Extension -10    Right Knee Flexion 130    Left Knee Extension -14    Left Knee Flexion 72      PROM   PROM Assessment Site Knee    Right/Left Knee Left    Left Knee Flexion 78      Strength   Overall Strength Comments not formally tested, fair quad set      Bed Mobility   Bed Mobility Sit to Supine    Sit to Supine Set up assist;Supervision/Verbal cueing;Moderate Assistance - Patient 50-74%   PT helped with Lt LE, demo'd use of Rt LE to hook and help bring Lt LE onto bed     Transfers   Transfers Sit to Stand    Stand to Sit 6: Modified independent (Device/Increase time)    Comments weight bearing through Rt LE>Lt LE      Ambulation/Gait   Ambulation/Gait Yes    Ambulation/Gait Assistance 6: Modified independent (Device/Increase time)    Assistive device Rolling walker    Gait Pattern Step-through pattern;Step-to pattern;Decreased step length - right;Left flexed knee in stance    Stairs Yes    Stairs Assistance 6: Modified independent (Device/Increase time);5: Supervision    Stair Management Technique Two rails;Step to pattern;Forwards                      Objective measurements completed on examination: See above findings.       [facility] Adult PT Treatment/Exercise - 01/11/21 0001      Self-Care   Self-Care Other Self-Care  Comments  Other Self-Care Comments  bed mobility using Rt LE assist for Lt LE, review of hospital issued HEP, weight shifting with RW      Modalities   Modalities Vasopneumatic      Vasopneumatic   Number Minutes Vasopneumatic  15 minutes    Vasopnuematic Location  Knee   Lt   Vasopneumatic Pressure Low    Vasopneumatic Temperature  34                  PT Education - 01/11/21 0842    Education Details Access Code: [email or web address]    Person(s) Educated Patient    Methods Explanation;Demonstration;Handout    Comprehension Verbalized understanding;Returned demonstration            PT Short Term Goals - 01/11/21 1257      PT SHORT TERM GOAL #1   Title Pt will be ind with initial HEP    Time 3    Period Weeks    Status New    Target Date 02/01/21      PT SHORT TERM GOAL #2   Title Pt will demo improved symmetry in WB through bil LEs with sit to stand from chair x 5 reps and with 3' walk test    Time 4    Period Weeks    Status New    Target Date 02/08/21      PT SHORT TERM GOAL #3   Title Pt will be able to stand for light houshold tasks and ambulation for up to 15 min with min pain.    Time 4    Period Weeks    Status New    Target Date 02/08/21      PT SHORT TERM GOAL #4   Title Pt will achieve Lt knee A/ROM of at least -10 ext and 105 flexion for improved gait and transfers.    Time 4    Period Weeks    Status New    Target Date 02/08/21      PT SHORT TERM GOAL #5   Title Pt will be able to ascend stairs using alt pattern x 5 steps and handrail for improved functional strength for entry into home.    Time 6    Period Weeks    Status New    Target Date 02/22/21             PT Long Term Goals - 01/11/21 1302      PT LONG TERM GOAL #1   Title Pt will be ind with advanced HEP and understand how to safely progress    Time 8    Period Weeks    Status New    Target Date 03/08/21      PT LONG TERM GOAL #2   Title Pt will achieve improved functional  strength of Lt LE for alt pattern with stairs (ascending and descending) for ease of coming/going from home    Time 8    Period Weeks    Status New    Target Date 03/08/21      PT LONG TERM GOAL #3   Title Pt will be able to stand for houshold tasks and ambulation for up to 30 min with min pain to improve tolerance with errands, exercise walks, house upkeep.    Time 8    Period Weeks    Status New    Target Date 03/08/21      PT LONG TERM GOAL #4   Title Improve FOTO score to  at least 58% to demo less limitation    Baseline 37%    Time 8    Period Weeks    Status New    Target Date 03/08/21      PT LONG TERM GOAL #5   Title Pt will achieve at least 120 flexion of Lt knee for improved ability to squat.    Time 8    Period Weeks    Status New    Target Date 03/08/21                  Plan - 01/11/21 0842    Clinical Impression Statement Pt is an 80yo female who is 5 days s/p Lt TKR performed by Dr. Tonita Cong on 01/06/21.  Prior to surgery patient ambulated with a Lt knee brace without AD but was very limited in distance due to pain.  She arrives using wheeled walker with pain rating 6/10.  She is using ice, elevation, compression stockings at home for edema control and has assistance from her sister who is in town to help her as she lives alone.  Pt has expected level of warmth and edema of Lt knee with ACE wrap and anterior adhesive bandaging along Lt anterior knee.  She has limited and painful A/ROM of -14 to 72 deg (P/ROM 78 deg).  Rt knee ROM is -10 to 130 deg.  Pt is able to activate a fair quad set on Lt.  Transfers from chair and gait with rolling walker demo limited weight shifting into Lt LE.  She ambulates with step to and step through pattern with knee flexed.  She is able to do stairs to get in/out of her home and demo'd step to pattern going up/down today.  She needed bed mobility assist with Lt LE today.  She has history of "sciatica" and needed racquetball under Lt glut  during game ready for relief of Lt buttock pain.  Pt's long term goal is to be able to walk up to 3 miles without AD.  She will benefit from skilled PT to address edema, pain, ROM, strength and functional return of use of Lt LE.    Personal Factors and Comorbidities Age;Comorbidity 1;Comorbidity 2;Time since onset of injury/illness/exacerbation;Comorbidity 3+    Comorbidities osteopenia, hx of breast CA, hx of DVT/PE, Lt wrist arthritis (wears brace)    Examination-Activity Limitations Locomotion Level;Bathing;Transfers;Bed Mobility;Sit;Sleep;Squat;Stairs;Stand;Toileting    Examination-Participation Restrictions Community Activity;Driving;Cleaning;Laundry;Shop    Stability/Clinical Decision Making Stable/Uncomplicated    Clinical Decision Making Low    Rehab Potential Excellent    PT Frequency 3x / week    PT Duration 8 weeks    PT Treatment/Interventions Aquatic Therapy;ADLs/Self Care Home Management;Electrical Stimulation;Cryotherapy;Moist Heat;Balance training;Therapeutic exercise;Therapeutic activities;Functional mobility training;Stair training;Gait training;Patient/family education;Neuromuscular re-education;Manual techniques;Scar mobilization;Passive range of motion;Energy conservation;Taping;Joint Manipulations;Spinal Manipulations;Vasopneumatic Device    PT Next Visit Plan Pt needs to book appts into April, try NuStep, review HEP, weight shifting/gait training, bed mobility training, ROM and gentle strength Lt knee as tol, vaso    PT Home Exercise Plan Access Code: [email or web address]    Recommended Other Services Pt interested in aquatic PT when cleared by surgeon (healed incision)    Consulted and Agree with Plan of Care Patient           Patient will benefit from skilled therapeutic intervention in order to improve the following deficits and impairments:  Abnormal gait,Decreased range of motion,Difficulty walking,Postural dysfunction,Decreased strength,Decreased mobility,Impaired  flexibility,Pain,Increased edema,Decreased activity tolerance,Decreased endurance  Visit Diagnosis: Acute pain of left  knee - Plan: PT plan of care cert/re-cert  Muscle weakness (generalized) - Plan: PT plan of care cert/re-cert  Other abnormalities of gait and mobility - Plan: PT plan of care cert/re-cert     Problem List Patient Active Problem List   Diagnosis Date Noted  . Left knee DJD 01/06/2021  . Coagulopathy ([place]) 11/30/2020  . Pulmonary embolus ([place]) 11/25/2020  . DVT (deep venous thrombosis) ([place]) 11/25/2020  . Presence of IVC filter 11/25/2020  . Vulvar lesion 09/16/2016  . Osteopenia 03/01/2016  . Lichen sclerosus 90/38/3338  . Cystocele 03/23/2015  . Malignant neoplasm of upper-outer quadrant of right breast in female, estrogen receptor positive ([place]) 01/10/2014    Johanna E Lonoke 01/11/2021, 1:10 PM  [facility] [address], [place], [place], [address] Phone: [phone]   Fax:  [phone]  Name: SADEEN WIEGEL MRN: [id number] Date of Birth: 04/02/1941

## 2021-01-11 NOTE — Patient Instructions (Signed)
Access Code: [email or web address] URL: [email or web address] Date: 01/11/2021 Prepared by: Venetia Night Chistine Dematteo  Exercises Side to Side Weight Shift with Counter Support - 1 x daily - 7 x weekly - 3 sets - 10 reps Staggered Stance Forward Backward Weight Shift with Counter Support - 3 x daily - 7 x weekly - 3 sets - 10 reps Supine Heel Slide - 3 x daily - 7 x weekly - 3 sets - 10 reps Supine Quad Set on Towel Roll - 3 x daily - 7 x weekly - 1 sets - 10 reps - 5 hold Ankle Pumps in Elevation - 3 x daily - 7 x weekly - 1 sets - 20 reps

## 2021-01-12 DIAGNOSIS — C50919 Malignant neoplasm of unspecified site of unspecified female breast: Secondary | ICD-10-CM | POA: Diagnosis not present

## 2021-01-12 DIAGNOSIS — R69 Illness, unspecified: Secondary | ICD-10-CM | POA: Diagnosis not present

## 2021-01-12 DIAGNOSIS — G4700 Insomnia, unspecified: Secondary | ICD-10-CM | POA: Diagnosis not present

## 2021-01-12 DIAGNOSIS — I10 Essential (primary) hypertension: Secondary | ICD-10-CM | POA: Diagnosis not present

## 2021-01-12 DIAGNOSIS — Z853 Personal history of malignant neoplasm of breast: Secondary | ICD-10-CM | POA: Diagnosis not present

## 2021-01-12 DIAGNOSIS — E039 Hypothyroidism, unspecified: Secondary | ICD-10-CM | POA: Diagnosis not present

## 2021-01-12 DIAGNOSIS — E782 Mixed hyperlipidemia: Secondary | ICD-10-CM | POA: Diagnosis not present

## 2021-01-13 ENCOUNTER — Other Ambulatory Visit: Payer: Self-pay

## 2021-01-13 ENCOUNTER — Encounter: Payer: Self-pay | Admitting: Physical Therapy

## 2021-01-13 ENCOUNTER — Ambulatory Visit: Payer: Medicare HMO | Admitting: Physical Therapy

## 2021-01-13 DIAGNOSIS — M6281 Muscle weakness (generalized): Secondary | ICD-10-CM

## 2021-01-13 DIAGNOSIS — R2689 Other abnormalities of gait and mobility: Secondary | ICD-10-CM

## 2021-01-13 DIAGNOSIS — M25562 Pain in left knee: Secondary | ICD-10-CM | POA: Diagnosis not present

## 2021-01-13 NOTE — Therapy (Addendum)
[facility] [address], [place], [place], [address] Phone: [phone]   Fax:  [phone]  Physical Therapy Treatment  Patient Details  Name: Jessica Bautista MRN: [id number] Date of Birth: 1941-10-07 Referring Provider (PT): Susa Day, MD   Encounter Date: 01/13/2021   PT End of Session - 01/13/21 1014    Visit Number 2    Date for PT Re-Evaluation 04/05/21    Authorization Type Aetna Medicare    Progress Note Due on Visit 10    PT Start Time 1014    PT Stop Time 1110    PT Time Calculation (min) 56 min    Activity Tolerance Patient tolerated treatment well    Behavior During Therapy [facility] for tasks assessed/performed           Past Medical History:  Diagnosis Date  . Atypical nevi   . Basal cell carcinoma   . Breast cancer ([place])   . DVT (deep venous thrombosis) ([place])   . Eczema   . Family history of colonic polyps   . Fatty (change of) liver, not elsewhere classified   . Hyperlipidemia   . Hypertension   . Hypothyroidism   . Impaired fasting glucose   . Insomnia   . Lichen sclerosus et atrophicus of the vulva   . Major depression, single episode   . Nontoxic single thyroid nodule   . Osteopenia   . Psoriasis   . Pulmonary embolus ([place]) 1999   post op tummy tuc  . Skin cancer   . Thyroid disease   . Vitamin D deficiency, unspecified   . Wears glasses     Past Surgical History:  Procedure Laterality Date  . ABDOMINOPLASTY  1998  . [place]   right  . BREAST LUMPECTOMY Right 01/27/2014   malignant  . BREAST LUMPECTOMY WITH NEEDLE LOCALIZATION Right 01/27/2014   Procedure: BREAST LUMPECTOMY WITH NEEDLE LOCALIZATION;  Surgeon: Edward Jolly, MD;  Location: [facility];  Service: General;  Laterality: Right;  . BREAST SURGERY    . COLONOSCOPY    . EYE SURGERY Bilateral 2012  . TOTAL KNEE ARTHROPLASTY Left 01/06/2021   Procedure: TOTAL KNEE  ARTHROPLASTY;  Surgeon: Susa Day, MD;  [facility];  Service: Orthopedics;  Laterality: Left;  2.5 hrs  . TUBAL LIGATION    . [place]   right    There were no vitals filed for this visit.   Subjective Assessment - 01/13/21 1019    Subjective Patient reports anterior thigh pain and burning in distal LE    Pertinent History osteopenia, breast CA, h/o DVT/PE    Limitations Walking;House hold activities;Standing;Sitting    Currently in Pain? Yes    Pain Location Knee    Pain Orientation Left;Anterior    Pain Descriptors / Indicators Burning    Pain Type Surgical pain              [facility] PT Assessment - 01/13/21 0001      AROM   Left Knee Extension -8    Left Knee Flexion 83                         [facility] Adult PT Treatment/Exercise - 01/13/21 0001      Knee/Hip Exercises: Stretches   Knee: Self-Stretch to increase Flexion Left    Knee: Self-Stretch Limitations with sliders x 2 minutes  Knee/Hip Exercises: Aerobic   Nustep lvl 1; legs only x 6 min   towel roll for lumbar     Knee/Hip Exercises: Supine   Heel Slides AAROM;1 set;10 reps    Heel Slides Limitations using slider      Vasopneumatic   Number Minutes Vasopneumatic  15 minutes    Vasopnuematic Location  Knee    Vasopneumatic Pressure Low    Vasopneumatic Temperature  34      Manual Therapy   Manual Therapy Edema management;Soft tissue mobilization    Manual therapy comments to facilitate muscle lengthening and decreased edema for improved AROM and decreased pain   increased turgor of hamstring   Edema Management to facilitate improved lymphatic and venous drainage    Soft tissue mobilization for improved tissue extensibility to improve ROM                  PT Education - 01/13/21 1057    Education Details Patient educated on progressive loading when increasing activity    Person(s) Educated Patient    Methods Explanation    Comprehension  Verbalized understanding            PT Short Term Goals - 01/11/21 1257      PT SHORT TERM GOAL #1   Title Pt will be ind with initial HEP    Time 3    Period Weeks    Status New    Target Date 02/01/21      PT SHORT TERM GOAL #2   Title Pt will demo improved symmetry in WB through bil LEs with sit to stand from chair x 5 reps and with 3' walk test    Time 4    Period Weeks    Status New    Target Date 02/08/21      PT SHORT TERM GOAL #3   Title Pt will be able to stand for light houshold tasks and ambulation for up to 15 min with min pain.    Time 4    Period Weeks    Status New    Target Date 02/08/21      PT SHORT TERM GOAL #4   Title Pt will achieve Lt knee A/ROM of at least -10 ext and 105 flexion for improved gait and transfers.    Time 4    Period Weeks    Status New    Target Date 02/08/21      PT SHORT TERM GOAL #5   Title Pt will be able to ascend stairs using alt pattern x 5 steps and handrail for improved functional strength for entry into home.    Time 6    Period Weeks    Status New    Target Date 02/22/21             PT Long Term Goals - 01/11/21 1302      PT LONG TERM GOAL #1   Title Pt will be ind with advanced HEP and understand how to safely progress    Time 8    Period Weeks    Status New    Target Date 03/08/21      PT LONG TERM GOAL #2   Title Pt will achieve improved functional strength of Lt LE for alt pattern with stairs (ascending and descending) for ease of coming/going from home    Time 8    Period Weeks    Status New    Target Date 03/08/21  PT LONG TERM GOAL #3   Title Pt will be able to stand for houshold tasks and ambulation for up to 30 min with min pain to improve tolerance with errands, exercise walks, house upkeep.    Time 8    Period Weeks    Status New    Target Date 03/08/21      PT LONG TERM GOAL #4   Title Improve FOTO score to at least 58% to demo less limitation    Baseline 37%    Time 8     Period Weeks    Status New    Target Date 03/08/21      PT LONG TERM GOAL #5   Title Pt will achieve at least 120 flexion of Lt knee for improved ability to squat.    Time 8    Period Weeks    Status New    Target Date 03/08/21                 Plan - 01/13/21 1021    Clinical Impression Statement Patient is now 1 week post op following Lt TKR. Arrives to session using RW. Reports compliance with HEP and continues use of ice and compression stockings for edema management. Ambulates into session with apparent avoidance of any Lt knee flex/ext. Lt knee flexion improved to 83 and extension improved to -8 degrees. Reports increased sciatic symptoms following supine activity. Patient able to self resolve when provided with racquetball for Lt glute. Would benefit from skilled intervention to addreess impairments to return to PLOF.    Personal Factors and Comorbidities Age;Comorbidity 1;Comorbidity 2;Time since onset of injury/illness/exacerbation;Comorbidity 3+    Comorbidities osteopenia, hx of breast CA, hx of DVT/PE, Lt wrist arthritis (wears brace)    Examination-Activity Limitations Locomotion Level;Bathing;Transfers;Bed Mobility;Sit;Sleep;Squat;Stairs;Stand;Toileting    Examination-Participation Restrictions Community Activity;Driving;Cleaning;Laundry;Shop    Rehab Potential Excellent    PT Frequency 3x / week    PT Duration 8 weeks    PT Treatment/Interventions Aquatic Therapy;ADLs/Self Care Home Management;Electrical Stimulation;Cryotherapy;Moist Heat;Balance training;Therapeutic exercise;Therapeutic activities;Functional mobility training;Stair training;Gait training;Patient/family education;Neuromuscular re-education;Manual techniques;Scar mobilization;Passive range of motion;Energy conservation;Taping;Joint Manipulations;Spinal Manipulations;Vasopneumatic Device    PT Next Visit Plan Pt needs to book appts into April, try NuStep, review HEP, weight shifting/gait training, bed  mobility training, ROM and gentle strength Lt knee as tol, vaso    PT Home Exercise Plan Access Code: [email or web address]    Consulted and Agree with Plan of Care Patient           Patient will benefit from skilled therapeutic intervention in order to improve the following deficits and impairments:  Abnormal gait,Decreased range of motion,Difficulty walking,Postural dysfunction,Decreased strength,Decreased mobility,Impaired flexibility,Pain,Increased edema,Decreased activity tolerance,Decreased endurance  Visit Diagnosis: Acute pain of left knee  Muscle weakness (generalized)  Other abnormalities of gait and mobility     Problem List Patient Active Problem List   Diagnosis Date Noted  . Left knee DJD 01/06/2021  . Coagulopathy ([place]) 11/30/2020  . Pulmonary embolus ([place]) 11/25/2020  . DVT (deep venous thrombosis) ([place]) 11/25/2020  . Presence of IVC filter 11/25/2020  . Vulvar lesion 09/16/2016  . Osteopenia 03/01/2016  . Lichen sclerosus 17/61/6073  . Cystocele 03/23/2015  . Malignant neoplasm of upper-outer quadrant of right breast in female, estrogen receptor positive ([place]) 01/10/2014    Everardo All PT, DPT  01/13/21 11:42 AM Myrene Galas, PTA 01/13/21 11:45 AM  [facility] [address] [place], [place], [address] Phone: [phone]   Fax:  [phone]  Name: KYRSTIN CAMPILLO MRN: [id number] Date of Birth: October 23, 1941

## 2021-01-15 ENCOUNTER — Other Ambulatory Visit: Payer: Self-pay

## 2021-01-15 ENCOUNTER — Ambulatory Visit: Payer: Medicare HMO | Admitting: Physical Therapy

## 2021-01-15 DIAGNOSIS — M6281 Muscle weakness (generalized): Secondary | ICD-10-CM

## 2021-01-15 DIAGNOSIS — R2689 Other abnormalities of gait and mobility: Secondary | ICD-10-CM

## 2021-01-15 DIAGNOSIS — M25562 Pain in left knee: Secondary | ICD-10-CM

## 2021-01-15 NOTE — Therapy (Signed)
[facility] [address] [place], [place], [address] Phone: [phone]   Fax:  [phone]  Physical Therapy Treatment  Patient Details  Name: Jessica Bautista MRN: [id number] Date of Birth: 1941/02/24 Referring Provider (PT): Susa Day, MD   Encounter Date: 01/15/2021   PT End of Session - 01/15/21 1229    Visit Number 3    Date for PT Re-Evaluation 04/05/21    Authorization Type [employer] Medicare    Progress Note Due on Visit 10    PT Start Time 0845    PT Stop Time 0935    PT Time Calculation (min) 50 min    Activity Tolerance Patient tolerated treatment well           Past Medical History:  Diagnosis Date  . Atypical nevi   . Basal cell carcinoma   . Breast cancer ([place])   . DVT (deep venous thrombosis) ([place])   . Eczema   . Family history of colonic polyps   . Fatty (change of) liver, not elsewhere classified   . Hyperlipidemia   . Hypertension   . Hypothyroidism   . Impaired fasting glucose   . Insomnia   . Lichen sclerosus et atrophicus of the vulva   . Major depression, single episode   . Nontoxic single thyroid nodule   . Osteopenia   . Psoriasis   . Pulmonary embolus ([place]) 1999   post op tummy tuc  . Skin cancer   . Thyroid disease   . Vitamin D deficiency, unspecified   . Wears glasses     Past Surgical History:  Procedure Laterality Date  . ABDOMINOPLASTY  1998  . [place]   right  . BREAST LUMPECTOMY Right 01/27/2014   malignant  . BREAST LUMPECTOMY WITH NEEDLE LOCALIZATION Right 01/27/2014   Procedure: BREAST LUMPECTOMY WITH NEEDLE LOCALIZATION;  Surgeon: Edward Jolly, MD;  Location: [place];  Service: General;  Laterality: Right;  . BREAST SURGERY    . COLONOSCOPY    . EYE SURGERY Bilateral 2012  . TOTAL KNEE ARTHROPLASTY Left 01/06/2021   Procedure: TOTAL KNEE ARTHROPLASTY;  Surgeon: Susa Day, MD;  Location: [facility];   Service: Orthopedics;  Laterality: Left;  2.5 hrs  . TUBAL LIGATION    . [place]   right    There were no vitals filed for this visit.   Subjective Assessment - 01/15/21 0923    Subjective My knee has a burning sensation today.  LE  with TED hose and ACE wrapped.    Pertinent History osteopenia, breast CA, h/o DVT/PE;  left TKR 01/06/21    Currently in Pain? Yes    Pain Score 6     Pain Location Knee    Pain Orientation Left    Pain Type Surgical pain                             [facility] Adult PT Treatment/Exercise - 01/15/21 0001      Knee/Hip Exercises: Stretches   Knee: Self-Stretch to increase Flexion Left    Knee: Self-Stretch Limitations with sliders x 2 minutes    Other Knee/Hip Stretches knee flexion stretch on 1st step 1 minute    Other Knee/Hip Stretches knee exension stretch on 1st step 1 minute      Knee/Hip Exercises: Standing   Lateral Step Up Limitations 2 inch forward/lateral step  up 10x    Other Standing Knee Exercises WB on left with step taps, right LE side taps    Other Standing Knee Exercises retro stepping 1 minute; church pew sway 1 min      Knee/Hip Exercises: Seated   Sit to Sand 10 reps;without UE support   very high plinth     Knee/Hip Exercises: Supine   Short Arc Quad Sets AAROM;10 reps    Short Arc Quad Sets Limitations therapist assist    Heel Slides AAROM;1 set;10 reps    Heel Slides Limitations therapist assist      Vasopneumatic   Number Minutes Vasopneumatic  15 minutes    Vasopnuematic Location  Knee    Vasopneumatic Pressure Medium    Vasopneumatic Temperature  34      Manual Therapy   Manual Therapy Edema management;Soft tissue mobilization    Manual therapy comments seated passive knee flexion    Soft tissue mobilization for improved tissue extensibility to improve ROM    Muscle Energy Technique contract relax HS 3x5 sec                    PT Short Term Goals - 01/11/21 1257       PT SHORT TERM GOAL #1   Title Pt will be ind with initial HEP    Time 3    Period Weeks    Status New    Target Date 02/01/21      PT SHORT TERM GOAL #2   Title Pt will demo improved symmetry in WB through bil LEs with sit to stand from chair x 5 reps and with 3' walk test    Time 4    Period Weeks    Status New    Target Date 02/08/21      PT SHORT TERM GOAL #3   Title Pt will be able to stand for light houshold tasks and ambulation for up to 15 min with min pain.    Time 4    Period Weeks    Status New    Target Date 02/08/21      PT SHORT TERM GOAL #4   Title Pt will achieve Lt knee A/ROM of at least -10 ext and 105 flexion for improved gait and transfers.    Time 4    Period Weeks    Status New    Target Date 02/08/21      PT SHORT TERM GOAL #5   Title Pt will be able to ascend stairs using alt pattern x 5 steps and handrail for improved functional strength for entry into home.    Time 6    Period Weeks    Status New    Target Date 02/22/21             PT Long Term Goals - 01/11/21 1302      PT LONG TERM GOAL #1   Title Pt will be ind with advanced HEP and understand how to safely progress    Time 8    Period Weeks    Status New    Target Date 03/08/21      PT LONG TERM GOAL #2   Title Pt will achieve improved functional strength of Lt LE for alt pattern with stairs (ascending and descending) for ease of coming/going from home    Time 8    Period Weeks    Status New    Target Date 03/08/21      PT  LONG TERM GOAL #3   Title Pt will be able to stand for houshold tasks and ambulation for up to 30 min with min pain to improve tolerance with errands, exercise walks, house upkeep.    Time 8    Period Weeks    Status New    Target Date 03/08/21      PT LONG TERM GOAL #4   Title Improve FOTO score to at least 58% to demo less limitation    Baseline 37%    Time 8    Period Weeks    Status New    Target Date 03/08/21      PT LONG TERM GOAL #5    Title Pt will achieve at least 120 flexion of Lt knee for improved ability to squat.    Time 8    Period Weeks    Status New    Target Date 03/08/21                 Plan - 01/15/21 1229    Clinical Impression Statement The patient returns s/p left TKR 01/06/21 and she continues to use her RW full time.  Unable to assess her incision site secondary to TED hose and bandaging.  Moderate swelling is apparent.  She is able to peform active assistive knee flexion and extension ROM 10-90 degrees.  Decreasing UE support needed with weight bearing ex's.  Good response to vasocompression with edema reduction and pain relief.  Therapist monitoring response with all treatment interventions.    Comorbidities osteopenia, hx of breast CA, hx of DVT/PE, Lt wrist arthritis (wears brace)    Rehab Potential Excellent    PT Frequency 3x / week    PT Duration 8 weeks    PT Treatment/Interventions Aquatic Therapy;ADLs/Self Care Home Management;Electrical Stimulation;Cryotherapy;Moist Heat;Balance training;Therapeutic exercise;Therapeutic activities;Functional mobility training;Stair training;Gait training;Patient/family education;Neuromuscular re-education;Manual techniques;Scar mobilization;Passive range of motion;Energy conservation;Taping;Joint Manipulations;Spinal Manipulations;Vasopneumatic Device    PT Next Visit Plan Pt needs to book appts into April,  NuStep, review HEP, weight shifting/gait training, bed mobility training, ROM and gentle strength Lt knee as tol, vaso    PT Home Exercise Plan Access Code: [email or web address]           Patient will benefit from skilled therapeutic intervention in order to improve the following deficits and impairments:  Abnormal gait,Decreased range of motion,Difficulty walking,Postural dysfunction,Decreased strength,Decreased mobility,Impaired flexibility,Pain,Increased edema,Decreased activity tolerance,Decreased endurance  Visit Diagnosis: Acute pain of left knee  Muscle  weakness (generalized)  Other abnormalities of gait and mobility     Problem List Patient Active Problem List   Diagnosis Date Noted  . Left knee DJD 01/06/2021  . Coagulopathy ([place]) 11/30/2020  . Pulmonary embolus ([place]) 11/25/2020  . DVT (deep venous thrombosis) ([place]) 11/25/2020  . Presence of IVC filter 11/25/2020  . Vulvar lesion 09/16/2016  . Osteopenia 03/01/2016  . Lichen sclerosus 75/88/3254  . Cystocele 03/23/2015  . Malignant neoplasm of upper-outer quadrant of right breast in female, estrogen receptor positive ([place]) 01/10/2014   Ruben Im, PT 01/15/21 12:35 PM Phone: [phone] Fax: [phone] Alvera Singh 01/15/2021, 12:35 PM  [facility] [address], [place], [place], [address] Phone: [phone]   Fax:  [phone]  Name: Jessica Bautista MRN: [id number] Date of Birth: 12-03-1940

## 2021-01-19 ENCOUNTER — Other Ambulatory Visit: Payer: Self-pay

## 2021-01-19 ENCOUNTER — Ambulatory Visit: Payer: Medicare HMO | Admitting: Physical Therapy

## 2021-01-19 DIAGNOSIS — R2689 Other abnormalities of gait and mobility: Secondary | ICD-10-CM | POA: Diagnosis not present

## 2021-01-19 DIAGNOSIS — M25562 Pain in left knee: Secondary | ICD-10-CM | POA: Diagnosis not present

## 2021-01-19 DIAGNOSIS — M6281 Muscle weakness (generalized): Secondary | ICD-10-CM | POA: Diagnosis not present

## 2021-01-19 NOTE — Patient Instructions (Signed)
Access Code: [email or web address] URL: [email or web address] Date: 01/19/2021 Prepared by: Ruben Im  Exercises Side to Side Weight Shift with Counter Support - 1 x daily - 7 x weekly - 3 sets - 10 reps Staggered Stance Forward Backward Weight Shift with Counter Support - 3 x daily - 7 x weekly - 3 sets - 10 reps Supine Heel Slide - 3 x daily - 7 x weekly - 3 sets - 10 reps Supine Quad Set on Towel Roll - 3 x daily - 7 x weekly - 1 sets - 10 reps - 5 hold Ankle Pumps in Elevation - 3 x daily - 7 x weekly - 1 sets - 20 reps Standing Knee Flexion Stretch on Step - 1 x daily - 7 x weekly - 2 sets - 10 reps Standing Hamstring Stretch with Step - 1 x daily - 7 x weekly - 2 sets - 10 reps Standing Forward Step Taps with Counter Support - 1 x daily - 7 x weekly - 1 sets - 10 reps Forward Step Up - 1 x daily - 7 x weekly - 1 sets - 10 reps Sit to Stand - 1 x daily - 7 x weekly - 2 sets - 5 reps

## 2021-01-19 NOTE — Therapy (Signed)
[facility] [address] [place], [place], [address] Phone: [phone]   Fax:  [phone]  Physical Therapy Treatment  Patient Details  Name: Jessica Bautista MRN: [id number] Date of Birth: 04-21-1941 Referring Provider (PT): Susa Day, MD   Encounter Date: 01/19/2021   PT End of Session - 01/19/21 1446    Visit Number 4    Date for PT Re-Evaluation 04/05/21    Authorization Type [employer]na Medicare    Progress Note Due on Visit 10    PT Start Time 1400    PT Stop Time 1450    PT Time Calculation (min) 50 min    Activity Tolerance Patient tolerated treatment well           Past Medical History:  Diagnosis Date  . Atypical nevi   . Basal cell carcinoma   . Breast cancer ([place])   . DVT (deep venous thrombosis) ([place])   . Eczema   . Family history of colonic polyps   . Fatty (change of) liver, not elsewhere classified   . Hyperlipidemia   . Hypertension   . Hypothyroidism   . Impaired fasting glucose   . Insomnia   . Lichen sclerosus et atrophicus of the vulva   . Major depression, single episode   . Nontoxic single thyroid nodule   . Osteopenia   . Psoriasis   . Pulmonary embolus ([place]) 1999   post op tummy tuc  . Skin cancer   . Thyroid disease   . Vitamin D deficiency, unspecified   . Wears glasses     Past Surgical History:  Procedure Laterality Date  . ABDOMINOPLASTY  1998  . [place]   right  . BREAST LUMPECTOMY Right 01/27/2014   malignant  . BREAST LUMPECTOMY WITH NEEDLE LOCALIZATION Right 01/27/2014   Procedure: BREAST LUMPECTOMY WITH NEEDLE LOCALIZATION;  Surgeon: Edward Jolly, MD;  Location: [facility];  Service: General;  Laterality: Right;  . BREAST SURGERY    . COLONOSCOPY    . EYE SURGERY Bilateral 2012  . TOTAL KNEE ARTHROPLASTY Left 01/06/2021   Procedure: TOTAL KNEE ARTHROPLASTY;  Surgeon: Susa Day, MD;  [facility];   Service: Orthopedics;  Laterality: Left;  2.5 hrs  . TUBAL LIGATION    . [place]   right    There were no vitals filed for this visit.       [facility] PT Assessment - 01/19/21 0001      AROM   Left Knee Extension 10   seated 18   Left Knee Flexion 95   96 supine                        [facility] Adult PT Treatment/Exercise - 01/19/21 0001      Knee/Hip Exercises: Stretches   Knee: Self-Stretch to increase Flexion Left    Knee: Self-Stretch Limitations with sliders x 2 minutes      Knee/Hip Exercises: Aerobic   Nustep lvl 1;  x 7 min      Knee/Hip Exercises: Standing   Forward Step Up Left;1 set;10 reps;Hand Hold: 2;Step Height: 2"    Other Standing Knee Exercises WB on left with step taps, right LE side taps    Other Standing Knee Exercises retro stepping 1 minute; church pew sway 1 min      Knee/Hip Exercises: Seated   Long Arc Quad Left;1 set;10 reps  Hamstring Curl Left;1 set;10 reps    Hamstring Limitations red band    Sit to Sand 5 reps;without UE support   chair plus foam     Vasopneumatic   Number Minutes Vasopneumatic  15 minutes    Vasopnuematic Location  Knee    Vasopneumatic Pressure Medium    Vasopneumatic Temperature  34      Manual Therapy   Manual therapy comments seated passive knee flexion; supine passive flexion 10x    Soft tissue mobilization for improved tissue extensibility to improve ROM    Muscle Energy Technique contract relax HS 3x5 sec                  PT Education - 01/19/21 1445    Education Details step stretch flexion; step extension stretch HS; 2 inch step ups, step taps; sit to stand    Person(s) Educated Patient    Methods Explanation;Demonstration;Handout    Comprehension Verbalized understanding;Returned demonstration            PT Short Term Goals - 01/11/21 1257      PT SHORT TERM GOAL #1   Title Pt will be ind with initial HEP    Time 3    Period Weeks    Status New    Target  Date 02/01/21      PT SHORT TERM GOAL #2   Title Pt will demo improved symmetry in WB through bil LEs with sit to stand from chair x 5 reps and with 3' walk test    Time 4    Period Weeks    Status New    Target Date 02/08/21      PT SHORT TERM GOAL #3   Title Pt will be able to stand for light houshold tasks and ambulation for up to 15 min with min pain.    Time 4    Period Weeks    Status New    Target Date 02/08/21      PT SHORT TERM GOAL #4   Title Pt will achieve Lt knee A/ROM of at least -10 ext and 105 flexion for improved gait and transfers.    Time 4    Period Weeks    Status New    Target Date 02/08/21      PT SHORT TERM GOAL #5   Title Pt will be able to ascend stairs using alt pattern x 5 steps and handrail for improved functional strength for entry into home.    Time 6    Period Weeks    Status New    Target Date 02/22/21             PT Long Term Goals - 01/11/21 1302      PT LONG TERM GOAL #1   Title Pt will be ind with advanced HEP and understand how to safely progress    Time 8    Period Weeks    Status New    Target Date 03/08/21      PT LONG TERM GOAL #2   Title Pt will achieve improved functional strength of Lt LE for alt pattern with stairs (ascending and descending) for ease of coming/going from home    Time 8    Period Weeks    Status New    Target Date 03/08/21      PT LONG TERM GOAL #3   Title Pt will be able to stand for houshold tasks and ambulation for up to 30 min with  min pain to improve tolerance with errands, exercise walks, house upkeep.    Time 8    Period Weeks    Status New    Target Date 03/08/21      PT LONG TERM GOAL #4   Title Improve FOTO score to at least 58% to demo less limitation    Baseline 37%    Time 8    Period Weeks    Status New    Target Date 03/08/21      PT LONG TERM GOAL #5   Title Pt will achieve at least 120 flexion of Lt knee for improved ability to squat.    Time 8    Period Weeks    Status  New    Target Date 03/08/21                 Plan - 01/19/21 1405    Clinical Impression Statement The patient presents with a QC today. Improved quad muscle activation although quad lag persists limiting full knee extension.  She is able to lift her leg on/off treatment table actively today.    Active supine ROM 8-96 degrees, seated ROM 10-95 degrees.  Moderate swelling present although unable to fully assess edema or incision secondary to ACE wrap and TED hose.    Comorbidities osteopenia, hx of breast CA, hx of DVT/PE, Lt wrist arthritis (wears brace)    Examination-Activity Limitations Locomotion Level;Bathing;Transfers;Bed Mobility;Sit;Sleep;Squat;Stairs;Stand;Toileting    Rehab Potential Excellent    PT Frequency 3x / week    PT Duration 8 weeks    PT Treatment/Interventions Aquatic Therapy;ADLs/Self Care Home Management;Electrical Stimulation;Cryotherapy;Moist Heat;Balance training;Therapeutic exercise;Therapeutic activities;Functional mobility training;Stair training;Gait training;Patient/family education;Neuromuscular re-education;Manual techniques;Scar mobilization;Passive range of motion;Energy conservation;Taping;Joint Manipulations;Spinal Manipulations;Vasopneumatic Device    PT Next Visit Plan see how MD appt went;  NuStep, review HEP, weight shifting/gait training, bed mobility training, ROM and gentle strength Lt knee as tol, vaso    PT Home Exercise Plan Access Code: [email or web address]           Patient will benefit from skilled therapeutic intervention in order to improve the following deficits and impairments:  Abnormal gait,Decreased range of motion,Difficulty walking,Postural dysfunction,Decreased strength,Decreased mobility,Impaired flexibility,Pain,Increased edema,Decreased activity tolerance,Decreased endurance  Visit Diagnosis: Acute pain of left knee  Muscle weakness (generalized)  Other abnormalities of gait and mobility     Problem List Patient Active Problem  List   Diagnosis Date Noted  . Left knee DJD 01/06/2021  . Coagulopathy ([place]) 11/30/2020  . Pulmonary embolus ([place]) 11/25/2020  . DVT (deep venous thrombosis) ([place]) 11/25/2020  . Presence of IVC filter 11/25/2020  . Vulvar lesion 09/16/2016  . Osteopenia 03/01/2016  . Lichen sclerosus 16/08/9603  . Cystocele 03/23/2015  . Malignant neoplasm of upper-outer quadrant of right breast in female, estrogen receptor positive ([place]) 01/10/2014   Ruben Im, PT 01/19/21 5:34 PM Phone: [phone] Fax: [phone] Alvera Singh 01/19/2021, 5:33 PM  [facility] [address] [place], [place], [address] Phone: [phone]   Fax:  [phone]  Name: DAPHANE ODEKIRK MRN: [id number] Date of Birth: 1941/10/15

## 2021-01-20 DIAGNOSIS — Z4789 Encounter for other orthopedic aftercare: Secondary | ICD-10-CM | POA: Diagnosis not present

## 2021-01-22 ENCOUNTER — Other Ambulatory Visit: Payer: Self-pay

## 2021-01-22 ENCOUNTER — Ambulatory Visit: Payer: Medicare HMO | Admitting: Physical Therapy

## 2021-01-22 DIAGNOSIS — M6281 Muscle weakness (generalized): Secondary | ICD-10-CM

## 2021-01-22 DIAGNOSIS — M25562 Pain in left knee: Secondary | ICD-10-CM | POA: Diagnosis not present

## 2021-01-22 DIAGNOSIS — R2689 Other abnormalities of gait and mobility: Secondary | ICD-10-CM | POA: Diagnosis not present

## 2021-01-22 NOTE — Therapy (Signed)
[facility] [address] [place], [place], [address] Phone: [phone]   Fax:  [phone]  Physical Therapy Treatment  Patient Details  Name: Jessica Bautista MRN: [id number] Date of Birth: 1940-12-15 Referring Provider (PT): Susa Day, MD   Encounter Date: 01/22/2021   PT End of Session - 01/22/21 0927    Visit Number 5    Date for PT Re-Evaluation 04/05/21    Authorization Type [employer]    Progress Note Due on Visit 10    PT Start Time 0845    PT Stop Time 0935    PT Time Calculation (min) 50 min    Activity Tolerance Patient tolerated treatment well           Past Medical History:  Diagnosis Date  . Atypical nevi   . Basal cell carcinoma   . Breast cancer ([place])   . DVT (deep venous thrombosis) ([place])   . Eczema   . Family history of colonic polyps   . Fatty (change of) liver, not elsewhere classified   . Hyperlipidemia   . Hypertension   . Hypothyroidism   . Impaired fasting glucose   . Insomnia   . Lichen sclerosus et atrophicus of the vulva   . Major depression, single episode   . Nontoxic single thyroid nodule   . Osteopenia   . Psoriasis   . Pulmonary embolus ([place]) 1999   post op tummy tuc  . Skin cancer   . Thyroid disease   . Vitamin D deficiency, unspecified   . Wears glasses     Past Surgical History:  Procedure Laterality Date  . ABDOMINOPLASTY  1998  . [place]   right  . BREAST LUMPECTOMY Right 01/27/2014   malignant  . BREAST LUMPECTOMY WITH NEEDLE LOCALIZATION Right 01/27/2014   Procedure: BREAST LUMPECTOMY WITH NEEDLE LOCALIZATION;  Surgeon: Edward Jolly, MD;  Location: [facility];  Service: General;  Laterality: Right;  . BREAST SURGERY    . COLONOSCOPY    . EYE SURGERY Bilateral 2012  . TOTAL KNEE ARTHROPLASTY Left 01/06/2021   Procedure: TOTAL KNEE ARTHROPLASTY;  Surgeon: Susa Day, MD;  Location: [facility];   Service: Orthopedics;  Laterality: Left;  2.5 hrs  . TUBAL LIGATION    . [place]   right    There were no vitals filed for this visit.   Subjective Assessment - 01/22/21 0848    Subjective The doctor was pleased with my progress and said I was more like 6 weeks post op.  I'm totally off the pain medicine now, just Tylenol.  States she doesn't use her QC around the house.    Pertinent History osteopenia, breast CA, h/o DVT/PE;  left TKR 01/06/21    Limitations Walking;House hold activities;Standing;Sitting    How long can you sit comfortably? a couple of hours if using ice packs    Patient Stated Goals be able to take a 3 mile walk    Currently in Pain? No/denies    Pain Score 0-No pain    Pain Location Knee    Pain Orientation Left    Pain Type Surgical pain                             [facility] Adult PT Treatment/Exercise - 01/22/21 0001      Knee/Hip Exercises: Stretches   Other Knee/Hip Stretches knee  flexion stretch on 1st step 1 minute    Other Knee/Hip Stretches knee exension stretch on 1st step 1 minute      Knee/Hip Exercises: Aerobic   Recumbent Bike rocking on bike 2 min 1/2 revolutions 1 min; then full revolutions 1 min    Nustep lvl 1;  x 3 min while discussing progress      Knee/Hip Exercises: Machines for Strengthening   Cybex Leg Press 60# bil 15x      Knee/Hip Exercises: Standing   Forward Step Up Left;1 set;10 reps;Hand Hold: 2;Step Height: 2"    Other Standing Knee Exercises WB on left with step taps, right LE side taps    Other Standing Knee Exercises stepping over cones right/left 8x each      Knee/Hip Exercises: Seated   Long Arc Quad Left;1 set;10 reps    Long Arc Quad Weight 2 lbs.    Hamstring Curl Left;1 set;10 reps    Hamstring Limitations red band    Sit to Sand without UE support;10 reps   chair plus foam     Vasopneumatic   Number Minutes Vasopneumatic  15 minutes    Vasopnuematic Location  Knee     Vasopneumatic Pressure Medium    Vasopneumatic Temperature  34      Manual Therapy   Manual therapy comments seated passive knee flexion; supine passive flexion 10x    Joint Mobilization superior and inferior/medial and lateral patellar mobs grade 3    Soft tissue mobilization for improved tissue extensibility to improve ROM    Muscle Energy Technique contract relax HS 3x5 sec                    PT Short Term Goals - 01/11/21 1257      PT SHORT TERM GOAL #1   Title Pt will be ind with initial HEP    Time 3    Period Weeks    Status New    Target Date 02/01/21      PT SHORT TERM GOAL #2   Title Pt will demo improved symmetry in WB through bil LEs with sit to stand from chair x 5 reps and with 3' walk test    Time 4    Period Weeks    Status New    Target Date 02/08/21      PT SHORT TERM GOAL #3   Title Pt will be able to stand for light houshold tasks and ambulation for up to 15 min with min pain.    Time 4    Period Weeks    Status New    Target Date 02/08/21      PT SHORT TERM GOAL #4   Title Pt will achieve Lt knee A/ROM of at least -10 ext and 105 flexion for improved gait and transfers.    Time 4    Period Weeks    Status New    Target Date 02/08/21      PT SHORT TERM GOAL #5   Title Pt will be able to ascend stairs using alt pattern x 5 steps and handrail for improved functional strength for entry into home.    Time 6    Period Weeks    Status New    Target Date 02/22/21             PT Long Term Goals - 01/11/21 1302      PT LONG TERM GOAL #1   Title Pt will be ind  with advanced HEP and understand how to safely progress    Time 8    Period Weeks    Status New    Target Date 03/08/21      PT LONG TERM GOAL #2   Title Pt will achieve improved functional strength of Lt LE for alt pattern with stairs (ascending and descending) for ease of coming/going from home    Time 8    Period Weeks    Status New    Target Date 03/08/21      PT LONG  TERM GOAL #3   Title Pt will be able to stand for houshold tasks and ambulation for up to 30 min with min pain to improve tolerance with errands, exercise walks, house upkeep.    Time 8    Period Weeks    Status New    Target Date 03/08/21      PT LONG TERM GOAL #4   Title Improve FOTO score to at least 58% to demo less limitation    Baseline 37%    Time 8    Period Weeks    Status New    Target Date 03/08/21      PT LONG TERM GOAL #5   Title Pt will achieve at least 120 flexion of Lt knee for improved ability to squat.    Time 8    Period Weeks    Status New    Target Date 03/08/21                 Plan - 01/22/21 7062    Clinical Impression Statement The patient is progressing well with ROM but with more limitation of extension vs flexion.   Terminal knee extension limited by moderate swelling, quad inhibition and shortened HS and gastroc.  Much improved ambulation with patient able to walk short distance in the clinic without an assistive device.  She is on track to meet rehab goals.  Therapist monitoring response throughout treatment session and providing cues to activate quads.    Personal Factors and Comorbidities Age;Comorbidity 1;Comorbidity 2;Time since onset of injury/illness/exacerbation;Comorbidity 3+    Examination-Activity Limitations Locomotion Level;Bathing;Transfers;Bed Mobility;Sit;Sleep;Squat;Stairs;Stand;Toileting    Examination-Participation Restrictions Community Activity;Driving;Cleaning;Laundry;Shop    Rehab Potential Excellent    PT Frequency 3x / week    PT Treatment/Interventions Aquatic Therapy;ADLs/Self Care Home Management;Electrical Stimulation;Cryotherapy;Moist Heat;Balance training;Therapeutic exercise;Therapeutic activities;Functional mobility training;Stair training;Gait training;Patient/family education;Neuromuscular re-education;Manual techniques;Scar mobilization;Passive range of motion;Energy conservation;Taping;Joint Manipulations;Spinal  Manipulations;Vasopneumatic Device    PT Next Visit Plan Bike and/or NuStep, review HEP, weight shifting/gait training,  ROM  strength Lt knee; try single leg on leg press; add calf stretching;  vaso;  recheck ROM    PT Home Exercise Plan Access Code: [email or web address]           Patient will benefit from skilled therapeutic intervention in order to improve the following deficits and impairments:  Abnormal gait,Decreased range of motion,Difficulty walking,Postural dysfunction,Decreased strength,Decreased mobility,Impaired flexibility,Pain,Increased edema,Decreased activity tolerance,Decreased endurance  Visit Diagnosis: Acute pain of left knee  Muscle weakness (generalized)  Other abnormalities of gait and mobility     Problem List Patient Active Problem List   Diagnosis Date Noted  . Left knee DJD 01/06/2021  . Coagulopathy ([place]) 11/30/2020  . Pulmonary embolus ([place]) 11/25/2020  . DVT (deep venous thrombosis) ([place]) 11/25/2020  . Presence of IVC filter 11/25/2020  . Vulvar lesion 09/16/2016  . Osteopenia 03/01/2016  . Lichen sclerosus 37/62/8315  . Cystocele 03/23/2015  . Malignant neoplasm of upper-outer quadrant of right  breast in female, estrogen receptor positive ([place]) 01/10/2014   Ruben Im, PT 01/22/21 12:26 PM Phone: [phone] Fax: [phone] Alvera Singh 01/22/2021, 12:26 PM  [facility] [address] [place], [place], [address] Phone: [phone]   Fax:  [phone]  Name: Jessica Bautista MRN: [id number] Date of Birth: 1941-09-04

## 2021-01-25 ENCOUNTER — Encounter: Payer: Self-pay | Admitting: Physical Therapy

## 2021-01-25 ENCOUNTER — Other Ambulatory Visit: Payer: Self-pay

## 2021-01-25 ENCOUNTER — Ambulatory Visit: Payer: Medicare HMO | Admitting: Physical Therapy

## 2021-01-25 DIAGNOSIS — M25562 Pain in left knee: Secondary | ICD-10-CM | POA: Diagnosis not present

## 2021-01-25 DIAGNOSIS — R2689 Other abnormalities of gait and mobility: Secondary | ICD-10-CM

## 2021-01-25 DIAGNOSIS — M6281 Muscle weakness (generalized): Secondary | ICD-10-CM

## 2021-01-25 NOTE — Therapy (Signed)
[facility] [address], [place], [place], [address] Phone: [phone]   Fax:  [phone]  Physical Therapy Treatment  Patient Details  Name: Jessica Bautista MRN: [id number] Date of Birth: 01-06-1941 Referring Provider (PT): Susa Day, MD   Encounter Date: 01/25/2021   PT End of Session - 01/25/21 1401    Visit Number 6    Date for PT Re-Evaluation 04/05/21    Authorization Type [employer] Medicare    Progress Note Due on Visit 10    PT Start Time 1401    PT Stop Time 1455    PT Time Calculation (min) 54 min    Activity Tolerance Patient tolerated treatment well    Behavior During Therapy [facility] for tasks assessed/performed           Past Medical History:  Diagnosis Date  . Atypical nevi   . Basal cell carcinoma   . Breast cancer ([place])   . DVT (deep venous thrombosis) ([place])   . Eczema   . Family history of colonic polyps   . Fatty (change of) liver, not elsewhere classified   . Hyperlipidemia   . Hypertension   . Hypothyroidism   . Impaired fasting glucose   . Insomnia   . Lichen sclerosus et atrophicus of the vulva   . Major depression, single episode   . Nontoxic single thyroid nodule   . Osteopenia   . Psoriasis   . Pulmonary embolus ([place]) 1999   post op tummy tuc  . Skin cancer   . Thyroid disease   . Vitamin D deficiency, unspecified   . Wears glasses     Past Surgical History:  Procedure Laterality Date  . ABDOMINOPLASTY  1998  . [place]   right  . BREAST LUMPECTOMY Right 01/27/2014   malignant  . BREAST LUMPECTOMY WITH NEEDLE LOCALIZATION Right 01/27/2014   Procedure: BREAST LUMPECTOMY WITH NEEDLE LOCALIZATION;  Surgeon: Edward Jolly, MD;  Location: [facility];  Service: General;  Laterality: Right;  . BREAST SURGERY    . COLONOSCOPY    . EYE SURGERY Bilateral 2012  . TOTAL KNEE ARTHROPLASTY Left 01/06/2021   Procedure: TOTAL KNEE  ARTHROPLASTY;  Surgeon: Susa Day, MD;  [facility];  Service: Orthopedics;  Laterality: Left;  2.5 hrs  . TUBAL LIGATION    . [place]   right    There were no vitals filed for this visit.   Subjective Assessment - 01/25/21 1403    Subjective I walked too much the other and my knee got pretty sore. it feels tight today.    Pertinent History osteopenia, breast CA, h/o DVT/PE;  left TKR 01/06/21    Currently in Pain? Yes    Pain Score 2     Pain Location Knee    Pain Orientation Left    Pain Descriptors / Indicators Tightness;Sore    Aggravating Factors  a lot of walking, weightbearing    Pain Relieving Factors Ince, rest    Multiple Pain Sites No              [facility] PT Assessment - 01/25/21 0001      AROM   Left Knee Extension --   10 active, 8-9 passive   Left Knee Flexion 111                         [facility] Adult PT Treatment/Exercise -  01/25/21 0001      Knee/Hip Exercises: Aerobic   Recumbent Bike L1 x 5 min, needed 3 min of rocking to warm knee up      Knee/Hip Exercises: Machines for Strengthening   Cybex Leg Press Seat 6: Bil 65# 10x, LTLE 20# Vc to DF ankle to increase TKE      Knee/Hip Exercises: Standing   Forward Step Up Left;1 set;10 reps;Hand Hold: 2;Step Height: 6"    Rocker Board --   10x with UE, then static hold 20 sec stretch, 2 rounds of this   Other Standing Knee Exercises WB on left with step taps, right LE side taps   2x10     Knee/Hip Exercises: Seated   Long Arc Quad Strengthening;Left;2 sets;10 reps;Weights    Long Arc Quad Weight 3 lbs.    Hamstring Curl Left;1 set;10 reps    Hamstring Limitations green band      Vasopneumatic   Number Minutes Vasopneumatic  15 minutes    Vasopnuematic Location  Knee    Vasopneumatic Pressure Medium    Vasopneumatic Temperature  3 flakes      Manual Therapy   Manual therapy comments seated passive knee flexion; supine passive flexion 10x    Soft tissue  mobilization Hamstring release work                    PT Short Term Goals - 01/11/21 1257      PT SHORT TERM GOAL #1   Title Pt will be ind with initial HEP    Time 3    Period Weeks    Status New    Target Date 02/01/21      PT SHORT TERM GOAL #2   Title Pt will demo improved symmetry in WB through bil LEs with sit to stand from chair x 5 reps and with 3' walk test    Time 4    Period Weeks    Status New    Target Date 02/08/21      PT SHORT TERM GOAL #3   Title Pt will be able to stand for light houshold tasks and ambulation for up to 15 min with min pain.    Time 4    Period Weeks    Status New    Target Date 02/08/21      PT SHORT TERM GOAL #4   Title Pt will achieve Lt knee A/ROM of at least -10 ext and 105 flexion for improved gait and transfers.    Time 4    Period Weeks    Status New    Target Date 02/08/21      PT SHORT TERM GOAL #5   Title Pt will be able to ascend stairs using alt pattern x 5 steps and handrail for improved functional strength for entry into home.    Time 6    Period Weeks    Status New    Target Date 02/22/21             PT Long Term Goals - 01/11/21 1302      PT LONG TERM GOAL #1   Title Pt will be ind with advanced HEP and understand how to safely progress    Time 8    Period Weeks    Status New    Target Date 03/08/21      PT LONG TERM GOAL #2   Title Pt will achieve improved functional strength of Lt LE for alt pattern  with stairs (ascending and descending) for ease of coming/going from home    Time 8    Period Weeks    Status New    Target Date 03/08/21      PT LONG TERM GOAL #3   Title Pt will be able to stand for houshold tasks and ambulation for up to 30 min with min pain to improve tolerance with errands, exercise walks, house upkeep.    Time 8    Period Weeks    Status New    Target Date 03/08/21      PT LONG TERM GOAL #4   Title Improve FOTO score to at least 58% to demo less limitation     Baseline 37%    Time 8    Period Weeks    Status New    Target Date 03/08/21      PT LONG TERM GOAL #5   Title Pt will achieve at least 120 flexion of Lt knee for improved ability to squat.    Time 8    Period Weeks    Status New    Target Date 03/08/21                 Plan - 01/25/21 1409    Clinical Impression Statement Pt reports doing more walking over the past few days and became pretty sore in her knee after. Knee soreness did not effect her exercises, but she does demonstrate weakness and tightness in TKE. Pt's active flexion improved since last measurement.    Personal Factors and Comorbidities Age;Comorbidity 1;Comorbidity 2;Time since onset of injury/illness/exacerbation;Comorbidity 3+    Comorbidities osteopenia, hx of breast CA, hx of DVT/PE, Lt wrist arthritis (wears brace)    Examination-Activity Limitations Locomotion Level;Bathing;Transfers;Bed Mobility;Sit;Sleep;Squat;Stairs;Stand;Toileting    Examination-Participation Restrictions Community Activity;Driving;Cleaning;Laundry;Shop    Stability/Clinical Decision Making Stable/Uncomplicated    Rehab Potential Excellent    PT Frequency 3x / week    PT Duration 8 weeks    PT Treatment/Interventions Aquatic Therapy;ADLs/Self Care Home Management;Electrical Stimulation;Cryotherapy;Moist Heat;Balance training;Therapeutic exercise;Therapeutic activities;Functional mobility training;Stair training;Gait training;Patient/family education;Neuromuscular re-education;Manual techniques;Scar mobilization;Passive range of motion;Energy conservation;Taping;Joint Manipulations;Spinal Manipulations;Vasopneumatic Device    PT Next Visit Plan Knee extension ROM and strength, add gastroc stretch to HEP next, leg press, vaso    PT Home Exercise Plan Access Code: [email or web address]    Consulted and Agree with Plan of Care --           Patient will benefit from skilled therapeutic intervention in order to improve the following deficits and  impairments:  Abnormal gait,Decreased range of motion,Difficulty walking,Postural dysfunction,Decreased strength,Decreased mobility,Impaired flexibility,Pain,Increased edema,Decreased activity tolerance,Decreased endurance  Visit Diagnosis: Acute pain of left knee  Muscle weakness (generalized)  Other abnormalities of gait and mobility     Problem List Patient Active Problem List   Diagnosis Date Noted  . Left knee DJD 01/06/2021  . Coagulopathy ([place]) 11/30/2020  . Pulmonary embolus ([place]) 11/25/2020  . DVT (deep venous thrombosis) ([place]) 11/25/2020  . Presence of IVC filter 11/25/2020  . Vulvar lesion 09/16/2016  . Osteopenia 03/01/2016  . Lichen sclerosus 81/15/7262  . Cystocele 03/23/2015  . Malignant neoplasm of upper-outer quadrant of right breast in female, estrogen receptor positive ([place]) 01/10/2014    Roderic Lammert, PTA 01/25/2021, 2:39 PM  [facility] [address] [name], [place], [address] Phone: [phone]   Fax:  [phone]  Name: THEO REITHER MRN: [id number] Date of Birth: 14-Jul-1941

## 2021-01-27 ENCOUNTER — Encounter: Payer: Medicare HMO | Admitting: Physical Therapy

## 2021-01-28 ENCOUNTER — Encounter: Payer: Self-pay | Admitting: Physical Therapy

## 2021-01-28 ENCOUNTER — Other Ambulatory Visit: Payer: Self-pay

## 2021-01-28 ENCOUNTER — Ambulatory Visit: Payer: Medicare HMO | Admitting: Physical Therapy

## 2021-01-28 DIAGNOSIS — M6281 Muscle weakness (generalized): Secondary | ICD-10-CM | POA: Diagnosis not present

## 2021-01-28 DIAGNOSIS — R2689 Other abnormalities of gait and mobility: Secondary | ICD-10-CM

## 2021-01-28 DIAGNOSIS — M25562 Pain in left knee: Secondary | ICD-10-CM

## 2021-01-28 NOTE — Therapy (Signed)
[facility] [address] [place], [place], [address] Phone: [phone]   Fax:  [phone]  Physical Therapy Treatment  Patient Details  Name: Jessica Bautista MRN: [id number] Date of Birth: 1941/06/16 Referring Provider (PT): Susa Day, MD   Encounter Date: 01/28/2021   PT End of Session - 01/28/21 1619    Visit Number 7    Date for PT Re-Evaluation 04/05/21    Authorization Type [employer] Medicare    Progress Note Due on Visit 10    PT Start Time 1615    PT Stop Time 1700    PT Time Calculation (min) 45 min    Equipment Utilized During Treatment Gait belt    Activity Tolerance Patient tolerated treatment well    Behavior During Therapy [facility] for tasks assessed/performed           Past Medical History:  Diagnosis Date  . Atypical nevi   . Basal cell carcinoma   . Breast cancer ([place])   . DVT (deep venous thrombosis) ([place])   . Eczema   . Family history of colonic polyps   . Fatty (change of) liver, not elsewhere classified   . Hyperlipidemia   . Hypertension   . Hypothyroidism   . Impaired fasting glucose   . Insomnia   . Lichen sclerosus et atrophicus of the vulva   . Major depression, single episode   . Nontoxic single thyroid nodule   . Osteopenia   . Psoriasis   . Pulmonary embolus ([place]) 1999   post op tummy tuc  . Skin cancer   . Thyroid disease   . Vitamin D deficiency, unspecified   . Wears glasses     Past Surgical History:  Procedure Laterality Date  . ABDOMINOPLASTY  1998  . [place]   right  . BREAST LUMPECTOMY Right 01/27/2014   malignant  . BREAST LUMPECTOMY WITH NEEDLE LOCALIZATION Right 01/27/2014   Procedure: BREAST LUMPECTOMY WITH NEEDLE LOCALIZATION;  Surgeon: Edward Jolly, MD;  Location: [place];  Service: General;  Laterality: Right;  . BREAST SURGERY    . COLONOSCOPY    . EYE SURGERY Bilateral 2012  . TOTAL KNEE ARTHROPLASTY  Left 01/06/2021   Procedure: TOTAL KNEE ARTHROPLASTY;  Surgeon: Susa Day, MD;  Location: [facility];  Service: Orthopedics;  Laterality: Left;  2.5 hrs  . TUBAL LIGATION    . [place]   right    There were no vitals filed for this visit.   Subjective Assessment - 01/28/21 1619    Subjective I am not having pain today.  The bending is better    Pertinent History osteopenia, breast CA, h/o DVT/PE;  left TKR 01/06/21    Patient Stated Goals be able to take a 3 mile walk    Currently in Pain? No/denies                             [facility] Adult PT Treatment/Exercise - 01/28/21 0001      Knee/Hip Exercises: Stretches   Gastroc Stretch Left;2 reps;20 seconds    Gastroc Stretch Limitations pt states she is doing at home - did not need to add to HEP      Knee/Hip Exercises: Aerobic   Recumbent Bike L1 x 5 min - full revolutions the whole time      Knee/Hip Exercises: Machines for Strengthening   Cybex  Leg Press Seat 6: Bil 65# 10x, LTLE 30# Vc to DF ankle to increase TKE      Knee/Hip Exercises: Standing   Forward Step Up Left;1 set;10 reps;Hand Hold: 2;Step Height: 6"    Rocker Board --   10x with UE, then static hold 20 sec stretch, 2 rounds of this   Other Standing Knee Exercises WB on left with step taps, right LE side taps   2x10     Knee/Hip Exercises: Seated   Long Arc Quad Strengthening;Left;2 sets;10 reps;Weights    Long Arc Quad Weight 3 lbs.      Vasopneumatic   Number Minutes Vasopneumatic  15 minutes    Vasopnuematic Location  Knee    Vasopneumatic Pressure Medium    Vasopneumatic Temperature  3 flakes      Manual Therapy   Manual therapy comments seated passive knee flexion; supine passive flexion 10x                    PT Short Term Goals - 01/11/21 1257      PT SHORT TERM GOAL #1   Title Pt will be ind with initial HEP    Time 3    Period Weeks    Status New    Target Date 02/01/21      PT SHORT TERM GOAL #2    Title Pt will demo improved symmetry in WB through bil LEs with sit to stand from chair x 5 reps and with 3' walk test    Time 4    Period Weeks    Status New    Target Date 02/08/21      PT SHORT TERM GOAL #3   Title Pt will be able to stand for light houshold tasks and ambulation for up to 15 min with min pain.    Time 4    Period Weeks    Status New    Target Date 02/08/21      PT SHORT TERM GOAL #4   Title Pt will achieve Lt knee A/ROM of at least -10 ext and 105 flexion for improved gait and transfers.    Time 4    Period Weeks    Status New    Target Date 02/08/21      PT SHORT TERM GOAL #5   Title Pt will be able to ascend stairs using alt pattern x 5 steps and handrail for improved functional strength for entry into home.    Time 6    Period Weeks    Status New    Target Date 02/22/21             PT Long Term Goals - 01/11/21 1302      PT LONG TERM GOAL #1   Title Pt will be ind with advanced HEP and understand how to safely progress    Time 8    Period Weeks    Status New    Target Date 03/08/21      PT LONG TERM GOAL #2   Title Pt will achieve improved functional strength of Lt LE for alt pattern with stairs (ascending and descending) for ease of coming/going from home    Time 8    Period Weeks    Status New    Target Date 03/08/21      PT LONG TERM GOAL #3   Title Pt will be able to stand for houshold tasks and ambulation for up to 30 min with min pain  to improve tolerance with errands, exercise walks, house upkeep.    Time 8    Period Weeks    Status New    Target Date 03/08/21      PT LONG TERM GOAL #4   Title Improve FOTO score to at least 58% to demo less limitation    Baseline 37%    Time 8    Period Weeks    Status New    Target Date 03/08/21      PT LONG TERM GOAL #5   Title Pt will achieve at least 120 flexion of Lt knee for improved ability to squat.    Time 8    Period Weeks    Status New    Target Date 03/08/21                  Plan - 01/28/21 1651    Clinical Impression Statement Pt demonstrates improved knee flexion to 113 degrees and did not need warm up on bike to get full revolutions.  Pt cued to use Lt LE more, but still needs mod assist from BUE.  Pt was able to increase resistance of leg press.  she continues to have tension and not getting full extension.  Pt will continue to benefit from skilled PT to address functional goals in plan of care.    PT Treatment/Interventions Aquatic Therapy;ADLs/Self Care Home Management;Electrical Stimulation;Cryotherapy;Moist Heat;Balance training;Therapeutic exercise;Therapeutic activities;Functional mobility training;Stair training;Gait training;Patient/family education;Neuromuscular re-education;Manual techniques;Scar mobilization;Passive range of motion;Energy conservation;Taping;Joint Manipulations;Spinal Manipulations;Vasopneumatic Device    PT Next Visit Plan Knee extension ROM and strength, add gastroc stretch to HEP next, leg press, vaso    PT Home Exercise Plan Access Code: [email or web address]    Consulted and Agree with Plan of Care Patient           Patient will benefit from skilled therapeutic intervention in order to improve the following deficits and impairments:  Abnormal gait,Decreased range of motion,Difficulty walking,Postural dysfunction,Decreased strength,Decreased mobility,Impaired flexibility,Pain,Increased edema,Decreased activity tolerance,Decreased endurance  Visit Diagnosis: Acute pain of left knee  Muscle weakness (generalized)  Other abnormalities of gait and mobility     Problem List Patient Active Problem List   Diagnosis Date Noted  . Left knee DJD 01/06/2021  . Coagulopathy ([place]) 11/30/2020  . Pulmonary embolus ([place]) 11/25/2020  . DVT (deep venous thrombosis) ([place]) 11/25/2020  . Presence of IVC filter 11/25/2020  . Vulvar lesion 09/16/2016  . Osteopenia 03/01/2016  . Lichen sclerosus 17/49/4496  . Cystocele 03/23/2015  .  Malignant neoplasm of upper-outer quadrant of right breast in female, estrogen receptor positive ([place]) 01/10/2014    Jule Ser, PT 01/28/2021, 5:06 PM  [facility] [address] [place], [place], [address] Phone: [phone]   Fax:  [phone]  Name: Jessica Bautista MRN: [id number] Date of Birth: 06-14-1941

## 2021-01-29 ENCOUNTER — Ambulatory Visit: Payer: Medicare HMO | Admitting: Physical Therapy

## 2021-01-29 DIAGNOSIS — M25562 Pain in left knee: Secondary | ICD-10-CM | POA: Diagnosis not present

## 2021-01-29 DIAGNOSIS — R2689 Other abnormalities of gait and mobility: Secondary | ICD-10-CM

## 2021-01-29 DIAGNOSIS — M6281 Muscle weakness (generalized): Secondary | ICD-10-CM

## 2021-01-29 NOTE — Therapy (Signed)
[facility] [address], [place], [place], [address] Phone: [phone]   Fax:  [phone]  Physical Therapy Treatment  Patient Details  Name: Jessica Bautista MRN: [id number] Date of Birth: Nov 30, 1940 Referring Provider (PT): Susa Day, MD   Encounter Date: 01/29/2021   PT End of Session - 01/29/21 1046    Visit Number 8    Date for PT Re-Evaluation 04/05/21    Authorization Type [employer] Medicare    Progress Note Due on Visit 10    PT Start Time 1000    PT Stop Time 1100    PT Time Calculation (min) 60 min    Activity Tolerance Patient tolerated treatment well           Past Medical History:  Diagnosis Date  . Atypical nevi   . Basal cell carcinoma   . Breast cancer ([place])   . DVT (deep venous thrombosis) ([place])   . Eczema   . Family history of colonic polyps   . Fatty (change of) liver, not elsewhere classified   . Hyperlipidemia   . Hypertension   . Hypothyroidism   . Impaired fasting glucose   . Insomnia   . Lichen sclerosus et atrophicus of the vulva   . Major depression, single episode   . Nontoxic single thyroid nodule   . Osteopenia   . Psoriasis   . Pulmonary embolus ([place]) 1999   post op tummy tuc  . Skin cancer   . Thyroid disease   . Vitamin D deficiency, unspecified   . Wears glasses     Past Surgical History:  Procedure Laterality Date  . ABDOMINOPLASTY  1998  . [place]   right  . BREAST LUMPECTOMY Right 01/27/2014   malignant  . BREAST LUMPECTOMY WITH NEEDLE LOCALIZATION Right 01/27/2014   Procedure: BREAST LUMPECTOMY WITH NEEDLE LOCALIZATION;  Surgeon: Edward Jolly, MD;  Location: [place];  Service: General;  Laterality: Right;  . BREAST SURGERY    . COLONOSCOPY    . EYE SURGERY Bilateral 2012  . TOTAL KNEE ARTHROPLASTY Left 01/06/2021   Procedure: TOTAL KNEE ARTHROPLASTY;  Surgeon: Susa Day, MD;  [facility];   Service: Orthopedics;  Laterality: Left;  2.5 hrs  . TUBAL LIGATION    . [place]   right    There were no vitals filed for this visit.   Subjective Assessment - 01/29/21 1003    Subjective I feel like I should have more bend.  Back to driving.  Arrives without an assistive device.    Pertinent History osteopenia, breast CA, h/o DVT/PE;  left TKR 01/06/21    Patient Stated Goals be able to take a 3 mile walk    Currently in Pain? No/denies    Pain Score 0-No pain    Pain Location Knee    Pain Orientation Left    Pain Type Surgical pain                             [facility] Adult PT Treatment/Exercise - 01/29/21 0001      Self-Care   Other Self-Care Comments  discussed knee extension stretch with towel roll behind ankle with light weight on thigh      Knee/Hip Exercises: Stretches   Quad Stretch Limitations on 2nd step with alternating knee extension/HS stretch 90 sec      Knee/Hip Exercises: Aerobic  Recumbent Bike L1 x 5 min - full revolutions the whole time      Knee/Hip Exercises: Machines for Strengthening   Cybex Knee Flexion 15# 20x bil      Knee/Hip Exercises: Standing   Forward Step Up Left;1 set;15 reps;Hand Hold: 2    Step Down Right;15 reps;Hand Hold: 2;Step Height: 2"    Other Standing Knee Exercises rockerboard 1 min      Knee/Hip Exercises: Seated   Long Arc Quad Strengthening;Left;2 sets;10 reps;Weights    Long Arc Quad Weight 3 lbs.    Other Seated Knee/Hip Exercises seated terminal knee extension heel lifts from stool 15x    Hamstring Curl Left;10 reps;2 sets    Hamstring Limitations red band      Vasopneumatic   Number Minutes Vasopneumatic  15 minutes    Vasopnuematic Location  Knee    Vasopneumatic Pressure Medium    Vasopneumatic Temperature  3 flakes      Manual Therapy   Manual therapy comments seated passive knee flexion; supine passive flexion 10x    Joint Mobilization superior and inferior/medial and  lateral patellar mobs grade 3    Soft tissue mobilization for improved tissue extensibility to improve ROM    Muscle Energy Technique contract relax HS 3x5 sec                    PT Short Term Goals - 01/29/21 1101      PT SHORT TERM GOAL #1   Title Pt will be ind with initial HEP    Status Achieved      PT SHORT TERM GOAL #2   Title Pt will demo improved symmetry in WB through bil LEs with sit to stand from chair x 5 reps and with 3' walk test    Status Achieved      PT SHORT TERM GOAL #3   Title Pt will be able to stand for light houshold tasks and ambulation for up to 15 min with min pain.    Status Achieved      PT SHORT TERM GOAL #4   Title Pt will achieve Lt knee A/ROM of at least -10 ext and 105 flexion for improved gait and transfers.    Status Achieved      PT SHORT TERM GOAL #5   Title Pt will be able to ascend stairs using alt pattern x 5 steps and handrail for improved functional strength for entry into home.    Status Achieved             PT Long Term Goals - 01/11/21 1302      PT LONG TERM GOAL #1   Title Pt will be ind with advanced HEP and understand how to safely progress    Time 8    Period Weeks    Status New    Target Date 03/08/21      PT LONG TERM GOAL #2   Title Pt will achieve improved functional strength of Lt LE for alt pattern with stairs (ascending and descending) for ease of coming/going from home    Time 8    Period Weeks    Status New    Target Date 03/08/21      PT LONG TERM GOAL #3   Title Pt will be able to stand for houshold tasks and ambulation for up to 30 min with min pain to improve tolerance with errands, exercise walks, house upkeep.    Time 8  Period Weeks    Status New    Target Date 03/08/21      PT LONG TERM GOAL #4   Title Improve FOTO score to at least 58% to demo less limitation    Baseline 37%    Time 8    Period Weeks    Status New    Target Date 03/08/21      PT LONG TERM GOAL #5   Title Pt  will achieve at least 120 flexion of Lt knee for improved ability to squat.    Time 8    Period Weeks    Status New    Target Date 03/08/21                 Plan - 01/29/21 1048    Clinical Impression Statement The patient is able to progress with quad and HS strengthening intensity including 2 inch step downs.  Treatment focus on terminal knee extension with through quad activation as well as HS and gastroc lengthening.   Decreased knee edema following vasocompression.  Therapist monitoring response to all interventions.  All STGs met.    Personal Factors and Comorbidities Age;Comorbidity 1;Comorbidity 2;Time since onset of injury/illness/exacerbation;Comorbidity 3+    Comorbidities osteopenia, hx of breast CA, hx of DVT/PE, Lt wrist arthritis (wears brace)    Examination-Activity Limitations Locomotion Level;Bathing;Transfers;Bed Mobility;Sit;Sleep;Squat;Stairs;Stand;Toileting    Rehab Potential Excellent    PT Frequency 3x / week    PT Duration 8 weeks    PT Treatment/Interventions Aquatic Therapy;ADLs/Self Care Home Management;Electrical Stimulation;Cryotherapy;Moist Heat;Balance training;Therapeutic exercise;Therapeutic activities;Functional mobility training;Stair training;Gait training;Patient/family education;Neuromuscular re-education;Manual techniques;Scar mobilization;Passive range of motion;Energy conservation;Taping;Joint Manipulations;Spinal Manipulations;Vasopneumatic Device    PT Next Visit Plan Knee extension ROM and strength, leg press, vaso    PT Home Exercise Plan Access Code: [email or web address]           Patient will benefit from skilled therapeutic intervention in order to improve the following deficits and impairments:  Abnormal gait,Decreased range of motion,Difficulty walking,Postural dysfunction,Decreased strength,Decreased mobility,Impaired flexibility,Pain,Increased edema,Decreased activity tolerance,Decreased endurance  Visit Diagnosis: Acute pain of left  knee  Muscle weakness (generalized)  Other abnormalities of gait and mobility     Problem List Patient Active Problem List   Diagnosis Date Noted  . Left knee DJD 01/06/2021  . Coagulopathy ([place]) 11/30/2020  . Pulmonary embolus ([place]) 11/25/2020  . DVT (deep venous thrombosis) ([place]) 11/25/2020  . Presence of IVC filter 11/25/2020  . Vulvar lesion 09/16/2016  . Osteopenia 03/01/2016  . Lichen sclerosus 50/56/9794  . Cystocele 03/23/2015  . Malignant neoplasm of upper-outer quadrant of right breast in female, estrogen receptor positive ([place]) 01/10/2014   Ruben Im, PT 01/29/21 11:02 AM Phone: [phone] Fax: [phone] Alvera Singh 01/29/2021, 11:02 AM  [facility] [address] [place], [place], [address] Phone: [phone]   Fax:  [phone]  Name: Jessica Bautista MRN: [id number] Date of Birth: 02/05/1941

## 2021-02-02 ENCOUNTER — Ambulatory Visit: Payer: Medicare HMO | Admitting: Physical Therapy

## 2021-02-04 ENCOUNTER — Other Ambulatory Visit: Payer: Self-pay

## 2021-02-04 ENCOUNTER — Ambulatory Visit: Payer: Medicare HMO | Admitting: Physical Therapy

## 2021-02-04 DIAGNOSIS — M25562 Pain in left knee: Secondary | ICD-10-CM

## 2021-02-04 DIAGNOSIS — R2689 Other abnormalities of gait and mobility: Secondary | ICD-10-CM | POA: Diagnosis not present

## 2021-02-04 DIAGNOSIS — M6281 Muscle weakness (generalized): Secondary | ICD-10-CM | POA: Diagnosis not present

## 2021-02-04 NOTE — Therapy (Signed)
[facility] [address] [place], [place], [address] Phone: [phone]   Fax:  [phone]  Physical Therapy Treatment  Patient Details  Name: Jessica Bautista MRN: [id number] Date of Birth: [birth year] Referring Provider (PT): Susa Day, MD   Encounter Date: 02/04/2021   PT End of Session - 02/04/21 1443    Visit Number 9    Date for PT Re-Evaluation 04/05/21    Authorization Type [employer] Medicare    Progress Note Due on Visit 10    PT Start Time 1400    PT Stop Time 1455    PT Time Calculation (min) 55 min    Activity Tolerance Patient tolerated treatment well           Past Medical History:  Diagnosis Date  . Atypical nevi   . Basal cell carcinoma   . Breast cancer ([place])   . DVT (deep venous thrombosis) ([place])   . Eczema   . Family history of colonic polyps   . Fatty (change of) liver, not elsewhere classified   . Hyperlipidemia   . Hypertension   . Hypothyroidism   . Impaired fasting glucose   . Insomnia   . Lichen sclerosus et atrophicus of the vulva   . Major depression, single episode   . Nontoxic single thyroid nodule   . Osteopenia   . Psoriasis   . Pulmonary embolus ([place]) 1999   post op tummy tuc  . Skin cancer   . Thyroid disease   . Vitamin D deficiency, unspecified   . Wears glasses     Past Surgical History:  Procedure Laterality Date  . ABDOMINOPLASTY  1998  . [place]   right  . BREAST LUMPECTOMY Right 01/27/2014   malignant  . BREAST LUMPECTOMY WITH NEEDLE LOCALIZATION Right 01/27/2014   Procedure: BREAST LUMPECTOMY WITH NEEDLE LOCALIZATION;  Surgeon: Edward Jolly, MD;  Location: [facility];  Service: General;  Laterality: Right;  . BREAST SURGERY    . COLONOSCOPY    . EYE SURGERY Bilateral 2012  . TOTAL KNEE ARTHROPLASTY Left 01/06/2021   Procedure: TOTAL KNEE ARTHROPLASTY;  Surgeon: Susa Day, MD;  Location: [facility];   Service: Orthopedics;  Laterality: Left;  2.5 hrs  . TUBAL LIGATION    . [place]   right    There were no vitals filed for this visit.   Subjective Assessment - 02/04/21 1359    Subjective I didn't do well with back to back therapy.  I've been working on keeping leg straight with weights on them.    Pertinent History osteopenia, breast CA, h/o DVT/PE;  left TKR 01/06/21    Patient Stated Goals be able to take a 3 mile walk    Currently in Pain? Yes    Pain Score 0-No pain   quad muscles sore   Pain Location Knee    Pain Orientation Left    Pain Type Surgical pain              [facility] PT Assessment - 02/04/21 0001      AROM   Left Knee Extension 7   4 degrees supine   Left Knee Flexion 117   120 degrees supine                        [facility] Adult PT Treatment/Exercise - 02/04/21 0001      Knee/Hip Exercises: Aerobic  Recumbent Bike L1 x 4 min - full revolutions the whole time      Knee/Hip Exercises: Machines for Strengthening   Cybex Knee Flexion 15# 20x bil    Cybex Leg Press Seat 6: , LTLE 40# Vc to DF ankle to increase TKE15x      Knee/Hip Exercises: Standing   Terminal Knee Extension Strengthening;Left;15 reps    Theraband Level (Terminal Knee Extension) Level 3 (Green)    Forward Step Up Left;1 set;15 reps;Hand Hold: 2;Step Height: 6"    Forward Step Up Limitations 4th step tap    Step Down Right;15 reps;Hand Hold: 2;Step Height: 2"    SLS with Vectors red loop around thighs side to side and hip abduction/extension 15x each    Other Standing Knee Exercises rockerboard 1 min    Other Standing Knee Exercises step over tall cones 5x right/left      Knee/Hip Exercises: Seated   Long Arc Quad --    Long Arc [employer] --    Other Seated Knee/Hip Exercises seated terminal knee extension heel lifts from stool 15x    Hamstring Curl --    Hamstring Limitations --      Vasopneumatic   Number Minutes Vasopneumatic  15 minutes     Vasopnuematic Location  Knee    Vasopneumatic Pressure Medium    Vasopneumatic Temperature  3 flakes      Manual Therapy   Manual therapy comments seated passive knee flexion; supine passive flexion 10x    Joint Mobilization superior and inferior/medial and lateral patellar mobs grade 3    Soft tissue mobilization for improved tissue extensibility to improve ROM    Muscle Energy Technique contract relax HS 3x5 sec                    PT Short Term Goals - 01/29/21 1101      PT SHORT TERM GOAL #1   Title Pt will be ind with initial HEP    Status Achieved      PT SHORT TERM GOAL #2   Title Pt will demo improved symmetry in WB through bil LEs with sit to stand from chair x 5 reps and with 3' walk test    Status Achieved      PT SHORT TERM GOAL #3   Title Pt will be able to stand for light houshold tasks and ambulation for up to 15 min with min pain.    Status Achieved      PT SHORT TERM GOAL #4   Title Pt will achieve Lt knee A/ROM of at least -10 ext and 105 flexion for improved gait and transfers.    Status Achieved      PT SHORT TERM GOAL #5   Title Pt will be able to ascend stairs using alt pattern x 5 steps and handrail for improved functional strength for entry into home.    Status Achieved             PT Long Term Goals - 02/04/21 1835      PT LONG TERM GOAL #1   Title Pt will be ind with advanced HEP and understand how to safely progress    Status On-going      PT LONG TERM GOAL #2   Title Pt will achieve improved functional strength of Lt LE for alt pattern with stairs (ascending and descending) for ease of coming/going from home      PT LONG TERM GOAL #3  Title Pt will be able to stand for houshold tasks and ambulation for up to 30 min with min pain to improve tolerance with errands, exercise walks, house upkeep.    Time 8    Period Weeks    Status On-going      PT LONG TERM GOAL #4   Title Improve FOTO score to at least 58% to demo less  limitation    Time 8    Period Weeks    Status On-going      PT LONG TERM GOAL #5   Title Pt will achieve at least 120 flexion of Lt knee for improved ability to squat.    Status Achieved                 Plan - 02/04/21 1444    Clinical Impression Statement The patient is progressing very well with progressive knee ROM: supine 4-120 degrees, seated 7-117 degrees.  Improving quad motor control with only a small quad lag at endrange.  She is ambulating without an assistive device and more symmetrical stance time.  She is compliant with her HEP including more focus on knee extension sustained stretching.   Discussed her excellent progress and agreed upon her readiness to decrease frequency to 2x/week.    Comorbidities osteopenia, hx of breast CA, hx of DVT/PE, Lt wrist arthritis (wears brace)    Examination-Activity Limitations Locomotion Level;Bathing;Transfers;Bed Mobility;Sit;Sleep;Squat;Stairs;Stand;Toileting    Examination-Participation Restrictions Community Activity;Driving;Cleaning;Laundry;Shop    Rehab Potential Excellent    PT Frequency 2x / week    PT Duration 8 weeks    PT Treatment/Interventions Aquatic Therapy;ADLs/Self Care Home Management;Electrical Stimulation;Cryotherapy;Moist Heat;Balance training;Therapeutic exercise;Therapeutic activities;Functional mobility training;Stair training;Gait training;Patient/family education;Neuromuscular re-education;Manual techniques;Scar mobilization;Passive range of motion;Energy conservation;Taping;Joint Manipulations;Spinal Manipulations;Vasopneumatic Device    PT Next Visit Plan 10th visit progress note;  Knee extension ROM and strength, leg press, vaso;  decrease frequency to 2x/week    PT Home Exercise Plan Access Code: [email or web address]           Patient will benefit from skilled therapeutic intervention in order to improve the following deficits and impairments:  Abnormal gait,Decreased range of motion,Difficulty walking,Postural  dysfunction,Decreased strength,Decreased mobility,Impaired flexibility,Pain,Increased edema,Decreased activity tolerance,Decreased endurance  Visit Diagnosis: Acute pain of left knee  Muscle weakness (generalized)  Other abnormalities of gait and mobility     Problem List Patient Active Problem List   Diagnosis Date Noted  . Left knee DJD 01/06/2021  . Coagulopathy ([place]) 11/30/2020  . Pulmonary embolus ([place]) 11/25/2020  . DVT (deep venous thrombosis) ([place]) 11/25/2020  . Presence of IVC filter 11/25/2020  . Vulvar lesion 09/16/2016  . Osteopenia 03/01/2016  . Lichen sclerosus 56/43/3295  . Cystocele 03/23/2015  . Malignant neoplasm of upper-outer quadrant of right breast in female, estrogen receptor positive ([place]) 01/10/2014   Ruben Im, PT 02/04/21 6:39 PM Phone: [phone] Fax: [phone]  Alvera Singh 02/04/2021, 6:39 PM  [facility] [address], [place], [place], [address] Phone: [phone]   Fax:  [phone]  Name: AKYLA VAVREK MRN: [id number] Date of Birth: April 15, 1941

## 2021-02-08 ENCOUNTER — Other Ambulatory Visit: Payer: Self-pay

## 2021-02-08 ENCOUNTER — Encounter: Payer: Self-pay | Admitting: Physical Therapy

## 2021-02-08 ENCOUNTER — Ambulatory Visit: Payer: Medicare HMO | Attending: Specialist | Admitting: Physical Therapy

## 2021-02-08 DIAGNOSIS — R2689 Other abnormalities of gait and mobility: Secondary | ICD-10-CM | POA: Diagnosis not present

## 2021-02-08 DIAGNOSIS — M25562 Pain in left knee: Secondary | ICD-10-CM | POA: Insufficient documentation

## 2021-02-08 DIAGNOSIS — M6281 Muscle weakness (generalized): Secondary | ICD-10-CM | POA: Insufficient documentation

## 2021-02-08 NOTE — Therapy (Addendum)
[facility] [address], [place], [place], [address] Phone: [phone]   Fax:  [phone]  Physical Therapy Treatment  Patient Details  Name: Jessica Bautista MRN: [id number] Date of Birth: 1940-11-10 Referring Provider (PT): Susa Day, MD   Progress Note Reporting Period 01/11/21 to 02/08/21  See note below for Objective Data and Assessment of Progress/Goals.       Encounter Date: 02/08/2021   PT End of Session - 02/08/21 1453    Visit Number 10    Date for PT Re-Evaluation 04/05/21    Authorization Type Aetna Medicare    PT Start Time 3716    PT Stop Time 1538    PT Time Calculation (min) 45 min    Activity Tolerance Patient tolerated treatment well    Behavior During Therapy WFL for tasks assessed/performed           Past Medical History:  Diagnosis Date  . Atypical nevi   . Basal cell carcinoma   . Breast cancer ([place])   . DVT (deep venous thrombosis) ([place])   . Eczema   . Family history of colonic polyps   . Fatty (change of) liver, not elsewhere classified   . Hyperlipidemia   . Hypertension   . Hypothyroidism   . Impaired fasting glucose   . Insomnia   . Lichen sclerosus et atrophicus of the vulva   . Major depression, single episode   . Nontoxic single thyroid nodule   . Osteopenia   . Psoriasis   . Pulmonary embolus ([place]) 1999   post op tummy tuc  . Skin cancer   . Thyroid disease   . Vitamin D deficiency, unspecified   . Wears glasses     Past Surgical History:  Procedure Laterality Date  . ABDOMINOPLASTY  1998  . [place]   right  . BREAST LUMPECTOMY Right 01/27/2014   malignant  . BREAST LUMPECTOMY WITH NEEDLE LOCALIZATION Right 01/27/2014   Procedure: BREAST LUMPECTOMY WITH NEEDLE LOCALIZATION;  Surgeon: Edward Jolly, MD;  Location: [facility];  Service: General;  Laterality: Right;  . BREAST SURGERY    . COLONOSCOPY    . EYE  SURGERY Bilateral 2012  . TOTAL KNEE ARTHROPLASTY Left 01/06/2021   Procedure: TOTAL KNEE ARTHROPLASTY;  Surgeon: Susa Day, MD;  [facility];  Service: Orthopedics;  Laterality: Left;  2.5 hrs  . TUBAL LIGATION    . [place]   right    There were no vitals filed for this visit.   Subjective Assessment - 02/08/21 1452    Subjective Just stiff from 1 1/2 mile walk this AM.    Pertinent History osteopenia, breast CA, h/o DVT/PE;  left TKR 01/06/21    Currently in Pain? No/denies    Multiple Pain Sites No              [facility] PT Assessment - 02/08/21 0001      Observation/Other Assessments   Focus on Therapeutic Outcomes (FOTO)  63%      AROM   Left Knee Extension 7   4 degrees supine   Left Knee Flexion 117   120 degrees supine                        [facility] Adult PT Treatment/Exercise - 02/08/21 0001      Knee/Hip Exercises: Aerobic   Recumbent Bike L2 x 5 min  Knee/Hip Exercises: Machines for Strengthening   Cybex Knee Extension 1 plate Bil 1Y07    Cybex Knee Flexion 15# 25x bil    Cybex Leg Press Seat: 6 LTLE 45# with ankle DF to increase TKE 2x15      Knee/Hip Exercises: Standing   Step Down --   lateral taps off 4" box 2x10   Other Standing Knee Exercises rockerboard 2 min    Other Standing Knee Exercises step over tall cones 10x right/left      Vasopneumatic   Number Minutes Vasopneumatic  15 minutes    Vasopnuematic Location  Knee    Vasopneumatic Pressure Medium    Vasopneumatic Temperature  3 flakes                    PT Short Term Goals - 01/29/21 1101      PT SHORT TERM GOAL #1   Title Pt will be ind with initial HEP    Status Achieved      PT SHORT TERM GOAL #2   Title Pt will demo improved symmetry in WB through bil LEs with sit to stand from chair x 5 reps and with 3' walk test    Status Achieved      PT SHORT TERM GOAL #3   Title Pt will be able to stand for light houshold tasks and  ambulation for up to 15 min with min pain.    Status Achieved      PT SHORT TERM GOAL #4   Title Pt will achieve Lt knee A/ROM of at least -10 ext and 105 flexion for improved gait and transfers.    Status Achieved      PT SHORT TERM GOAL #5   Title Pt will be able to ascend stairs using alt pattern x 5 steps and handrail for improved functional strength for entry into home.    Status Achieved             PT Long Term Goals - 02/08/21 1521      PT LONG TERM GOAL #1   Title Pt will be ind with advanced HEP and understand how to safely progress    Time 8    Period Weeks    Status On-going      PT LONG TERM GOAL #2   Title Pt will achieve improved functional strength of Lt LE for alt pattern with stairs (ascending and descending) for ease of coming/going from home    Time 8    Period Weeks    Status On-going   Descending difficult     PT LONG TERM GOAL #3   Title Pt will be able to stand for houshold tasks and ambulation for up to 30 min with min pain to improve tolerance with errands, exercise walks, house upkeep.    Time 8    Period Weeks    Status Achieved      PT LONG TERM GOAL #4   Title Improve FOTO score to at least 58% to demo less limitation    Time 8    Period Weeks    Status Achieved      PT LONG TERM GOAL #5   Title Pt will achieve at least 120 flexion of Lt knee for improved ability to squat.    Time 8    Period Weeks    Status Achieved                 Plan - 02/08/21 1453  Clinical Impression Statement Pt was able to take her first mile & 1/2 walk this AM. Just some post walk stiffness. Pt did well on gym equipment today, continuing to focus on TKE strength and stretch. FOTO significantly improved, meeting goal. AROM about the same as last measurement.    Personal Factors and Comorbidities Age;Comorbidity 1;Comorbidity 2;Time since onset of injury/illness/exacerbation;Comorbidity 3+    Comorbidities osteopenia, hx of breast CA, hx of DVT/PE, Lt  wrist arthritis (wears brace)    Examination-Activity Limitations Locomotion Level;Bathing;Transfers;Bed Mobility;Sit;Sleep;Squat;Stairs;Stand;Toileting    Examination-Participation Restrictions Community Activity;Driving;Cleaning;Laundry;Shop    Stability/Clinical Decision Making Stable/Uncomplicated    Rehab Potential Excellent    PT Frequency 2x / week    PT Duration 8 weeks    PT Treatment/Interventions Aquatic Therapy;ADLs/Self Care Home Management;Electrical Stimulation;Cryotherapy;Moist Heat;Balance training;Therapeutic exercise;Therapeutic activities;Functional mobility training;Stair training;Gait training;Patient/family education;Neuromuscular re-education;Manual techniques;Scar mobilization;Passive range of motion;Energy conservation;Taping;Joint Manipulations;Spinal Manipulations;Vasopneumatic Device    PT Next Visit Plan Knee extension ROM and strength, leg press, vaso;  decrease frequency to 2x/week    PT Home Exercise Plan Access Code: [email or web address]    Consulted and Agree with Plan of Care Patient           Patient will benefit from skilled therapeutic intervention in order to improve the following deficits and impairments:  Abnormal gait,Decreased range of motion,Difficulty walking,Postural dysfunction,Decreased strength,Decreased mobility,Impaired flexibility,Pain,Increased edema,Decreased activity tolerance,Decreased endurance  Visit Diagnosis: Acute pain of left knee  Muscle weakness (generalized)  Other abnormalities of gait and mobility     Problem List Patient Active Problem List   Diagnosis Date Noted  . Left knee DJD 01/06/2021  . Coagulopathy ([place]) 11/30/2020  . Pulmonary embolus ([place]) 11/25/2020  . DVT (deep venous thrombosis) ([place]) 11/25/2020  . Presence of IVC filter 11/25/2020  . Vulvar lesion 09/16/2016  . Osteopenia 03/01/2016  . Lichen sclerosus 82/42/3536  . Cystocele 03/23/2015  . Malignant neoplasm of upper-outer quadrant of right breast in  female, estrogen receptor positive ([place]) 01/10/2014   Ruben Im, PT 02/09/21 7:43 AM Phone: [phone] Fax: [phone] Delorese Sellin, PTA 02/08/2021, 3:24 PM  [facility] [address], [place], [place], [address] Phone: [phone]   Fax:  [phone]  Name: Jessica Bautista MRN: [id number] Date of Birth: 12-10-1940

## 2021-02-10 ENCOUNTER — Other Ambulatory Visit: Payer: Self-pay

## 2021-02-10 ENCOUNTER — Encounter: Payer: Self-pay | Admitting: Physical Therapy

## 2021-02-10 ENCOUNTER — Ambulatory Visit: Payer: Medicare HMO | Admitting: Physical Therapy

## 2021-02-10 DIAGNOSIS — M25562 Pain in left knee: Secondary | ICD-10-CM

## 2021-02-10 DIAGNOSIS — M6281 Muscle weakness (generalized): Secondary | ICD-10-CM | POA: Diagnosis not present

## 2021-02-10 DIAGNOSIS — R2689 Other abnormalities of gait and mobility: Secondary | ICD-10-CM | POA: Diagnosis not present

## 2021-02-10 NOTE — Therapy (Signed)
[facility] [address], [place], [place], [address] Phone: [phone]   Fax:  [phone]  Physical Therapy Treatment  Patient Details  Name: Jessica Bautista MRN: [id number] Date of Birth: 01-Oct-1941 Referring Provider (PT): Susa Day, MD   Encounter Date: 02/10/2021   PT End of Session - 02/10/21 1400    Visit Number 11    Date for PT Re-Evaluation 04/05/21    Authorization Type [employer] Medicare    Progress Note Due on Visit 20    PT Start Time 1359    PT Stop Time 1455    PT Time Calculation (min) 56 min    Activity Tolerance Patient tolerated treatment well    Behavior During Therapy [facility] for tasks assessed/performed           Past Medical History:  Diagnosis Date  . Atypical nevi   . Basal cell carcinoma   . Breast cancer ([place])   . DVT (deep venous thrombosis) ([place])   . Eczema   . Family history of colonic polyps   . Fatty (change of) liver, not elsewhere classified   . Hyperlipidemia   . Hypertension   . Hypothyroidism   . Impaired fasting glucose   . Insomnia   . Lichen sclerosus et atrophicus of the vulva   . Major depression, single episode   . Nontoxic single thyroid nodule   . Osteopenia   . Psoriasis   . Pulmonary embolus ([place]) 1999   post op tummy tuc  . Skin cancer   . Thyroid disease   . Vitamin D deficiency, unspecified   . Wears glasses     Past Surgical History:  Procedure Laterality Date  . ABDOMINOPLASTY  1998  . [place]   right  . BREAST LUMPECTOMY Right 01/27/2014   malignant  . BREAST LUMPECTOMY WITH NEEDLE LOCALIZATION Right 01/27/2014   Procedure: BREAST LUMPECTOMY WITH NEEDLE LOCALIZATION;  Surgeon: Edward Jolly, MD;  Location: [facility];  Service: General;  Laterality: Right;  . BREAST SURGERY    . COLONOSCOPY    . EYE SURGERY Bilateral 2012  . TOTAL KNEE ARTHROPLASTY Left 01/06/2021   Procedure: TOTAL KNEE  ARTHROPLASTY;  Surgeon: Susa Day, MD;  Location: [facility];  Service: Orthopedics;  Laterality: Left;  2.5 hrs  . TUBAL LIGATION    . [place]   right    There were no vitals filed for this visit.   Subjective Assessment - 02/10/21 1401    Subjective Felt great after last session and still doing well today.    Pertinent History osteopenia, breast CA, h/o DVT/PE;  left TKR 01/06/21    Limitations Walking;House hold activities;Standing;Sitting    Patient Stated Goals be able to take a 3 mile walk    Currently in Pain? No/denies    Aggravating Factors  Maybe if I overdo..    Pain Relieving Factors Rest    Multiple Pain Sites No              [facility] PT Assessment - 02/10/21 0001      AROM   Left Knee Extension --   long sitting 3-4 degrees active                        [facility] Adult PT Treatment/Exercise - 02/10/21 0001      Knee/Hip Exercises: Stretches   Hip Flexor Stretch --   Off  EOB 30 sec     Knee/Hip Exercises: Aerobic   Recumbent Bike L2 x 6 min      Knee/Hip Exercises: Machines for Strengthening   Cybex Knee Extension 1 plate 2x10 Bil with VC to let LTLE do more than the RT   VC to unlock knee even slower   Cybex Knee Flexion 15# lt > rt on flex    Cybex Leg Press Seat 6: 45# 10x      Knee/Hip Exercises: Standing   Forward Step Up --   On BOSU with a few fingers support 10x   Step Down Left;1 set;10 reps;Hand Hold: 2;Step Height: 6"    Step Down Limitations VC for alignment    Rocker Board 2 minutes   1 min DF stretch at end of 2 min   Other Standing Knee Exercises step over tall cones 10x right/left      Knee/Hip Exercises: Seated   Long Arc Quad Strengthening;Left;1 set;10 reps;Weights    Long Arc Quad Weight 3 lbs.      Knee/Hip Exercises: Prone   Other Prone Exercises TKE 10x      Vasopneumatic   Number Minutes Vasopneumatic  15 minutes    Vasopnuematic Location  Knee    Vasopneumatic Pressure Medium    Vasopneumatic  Temperature  3 flakes                    PT Short Term Goals - 01/29/21 1101      PT SHORT TERM GOAL #1   Title Pt will be ind with initial HEP    Status Achieved      PT SHORT TERM GOAL #2   Title Pt will demo improved symmetry in WB through bil LEs with sit to stand from chair x 5 reps and with 3' walk test    Status Achieved      PT SHORT TERM GOAL #3   Title Pt will be able to stand for light houshold tasks and ambulation for up to 15 min with min pain.    Status Achieved      PT SHORT TERM GOAL #4   Title Pt will achieve Lt knee A/ROM of at least -10 ext and 105 flexion for improved gait and transfers.    Status Achieved      PT SHORT TERM GOAL #5   Title Pt will be able to ascend stairs using alt pattern x 5 steps and handrail for improved functional strength for entry into home.    Status Achieved             PT Long Term Goals - 02/08/21 1521      PT LONG TERM GOAL #1   Title Pt will be ind with advanced HEP and understand how to safely progress    Time 8    Period Weeks    Status On-going      PT LONG TERM GOAL #2   Title Pt will achieve improved functional strength of Lt LE for alt pattern with stairs (ascending and descending) for ease of coming/going from home    Time 8    Period Weeks    Status On-going   Descending difficult     PT LONG TERM GOAL #3   Title Pt will be able to stand for houshold tasks and ambulation for up to 30 min with min pain to improve tolerance with errands, exercise walks, house upkeep.    Time 8    Period Weeks  Status Achieved      PT LONG TERM GOAL #4   Title Improve FOTO score to at least 58% to demo less limitation    Time 8    Period Weeks    Status Achieved      PT LONG TERM GOAL #5   Title Pt will achieve at least 120 flexion of Lt knee for improved ability to squat.    Time 8    Period Weeks    Status Achieved                 Plan - 02/10/21 1400    Clinical Impression Statement Pt  reports having another good day ( good week) which even included weeding. pt does report some fatigue with "heavier" tasks, but this is improving. Pt also going down the stairs reciprocally more frequently. Treatment focused mainly on knee strength in a variety of positions/situations. pt's active extension improving.    Personal Factors and Comorbidities Age;Comorbidity 1;Comorbidity 2;Time since onset of injury/illness/exacerbation;Comorbidity 3+    Comorbidities osteopenia, hx of breast CA, hx of DVT/PE, Lt wrist arthritis (wears brace)    Examination-Activity Limitations Locomotion Level;Bathing;Transfers;Bed Mobility;Sit;Sleep;Squat;Stairs;Stand;Toileting    Stability/Clinical Decision Making Stable/Uncomplicated    Rehab Potential Excellent    PT Frequency 2x / week    PT Duration 8 weeks    PT Treatment/Interventions Aquatic Therapy;ADLs/Self Care Home Management;Electrical Stimulation;Cryotherapy;Moist Heat;Balance training;Therapeutic exercise;Therapeutic activities;Functional mobility training;Stair training;Gait training;Patient/family education;Neuromuscular re-education;Manual techniques;Scar mobilization;Passive range of motion;Energy conservation;Taping;Joint Manipulations;Spinal Manipulations;Vasopneumatic Device    PT Next Visit Plan Knee extension ROM and strength, leg press, vaso, strength in descending stairs    PT Home Exercise Plan Access Code: [email or web address]    Consulted and Agree with Plan of Care Patient           Patient will benefit from skilled therapeutic intervention in order to improve the following deficits and impairments:  Abnormal gait,Decreased range of motion,Difficulty walking,Postural dysfunction,Decreased strength,Decreased mobility,Impaired flexibility,Pain,Increased edema,Decreased activity tolerance,Decreased endurance  Visit Diagnosis: Acute pain of left knee  Muscle weakness (generalized)  Other abnormalities of gait and mobility     Problem  List Patient Active Problem List   Diagnosis Date Noted  . Left knee DJD 01/06/2021  . Coagulopathy ([place]) 11/30/2020  . Pulmonary embolus ([place]) 11/25/2020  . DVT (deep venous thrombosis) ([place]) 11/25/2020  . Presence of IVC filter 11/25/2020  . Vulvar lesion 09/16/2016  . Osteopenia 03/01/2016  . Lichen sclerosus 50/53/9767  . Cystocele 03/23/2015  . Malignant neoplasm of upper-outer quadrant of right breast in female, estrogen receptor positive ([place]) 01/10/2014    Jessica Bautista, PTA 02/10/2021, 2:39 PM   [facility] [address] [place], [place], [address] Phone: [phone]   Fax:  [phone]  Name: Jessica Bautista MRN: [id number] Date of Birth: July 16, 1941

## 2021-02-12 ENCOUNTER — Encounter: Payer: Medicare HMO | Admitting: Physical Therapy

## 2021-02-15 ENCOUNTER — Ambulatory Visit: Payer: Medicare HMO | Admitting: Physical Therapy

## 2021-02-15 ENCOUNTER — Encounter: Payer: Self-pay | Admitting: Physical Therapy

## 2021-02-15 ENCOUNTER — Other Ambulatory Visit: Payer: Self-pay

## 2021-02-15 DIAGNOSIS — R2689 Other abnormalities of gait and mobility: Secondary | ICD-10-CM

## 2021-02-15 DIAGNOSIS — M25562 Pain in left knee: Secondary | ICD-10-CM | POA: Diagnosis not present

## 2021-02-15 DIAGNOSIS — M6281 Muscle weakness (generalized): Secondary | ICD-10-CM

## 2021-02-15 NOTE — Therapy (Signed)
[facility] [address], [place], [place], [address] Phone: [phone]   Fax:  [phone]  Physical Therapy Treatment  Patient Details  Name: Jessica Bautista MRN: [id number] Date of Birth: Oct 31, 1941 Referring Provider (PT): Susa Day, MD   Encounter Date: 02/15/2021   PT End of Session - 02/15/21 1400    Visit Number 12    Date for PT Re-Evaluation 04/05/21    Authorization Type [employer]na Medicare    Progress Note Due on Visit 20    PT Start Time 1359    PT Stop Time 1452    PT Time Calculation (min) 53 min    Activity Tolerance Patient tolerated treatment well    Behavior During Therapy [facility] for tasks assessed/performed           Past Medical History:  Diagnosis Date  . Atypical nevi   . Basal cell carcinoma   . Breast cancer ([place])   . DVT (deep venous thrombosis) ([place])   . Eczema   . Family history of colonic polyps   . Fatty (change of) liver, not elsewhere classified   . Hyperlipidemia   . Hypertension   . Hypothyroidism   . Impaired fasting glucose   . Insomnia   . Lichen sclerosus et atrophicus of the vulva   . Major depression, single episode   . Nontoxic single thyroid nodule   . Osteopenia   . Psoriasis   . Pulmonary embolus ([place]) 1999   post op tummy tuc  . Skin cancer   . Thyroid disease   . Vitamin D deficiency, unspecified   . Wears glasses     Past Surgical History:  Procedure Laterality Date  . ABDOMINOPLASTY  1998  . [place]   right  . BREAST LUMPECTOMY Right 01/27/2014   malignant  . BREAST LUMPECTOMY WITH NEEDLE LOCALIZATION Right 01/27/2014   Procedure: BREAST LUMPECTOMY WITH NEEDLE LOCALIZATION;  Surgeon: Edward Jolly, MD;  Location: [facility];  Service: General;  Laterality: Right;  . BREAST SURGERY    . COLONOSCOPY    . EYE SURGERY Bilateral 2012  . TOTAL KNEE ARTHROPLASTY Left 01/06/2021   Procedure: TOTAL KNEE  ARTHROPLASTY;  Surgeon: Susa Day, MD;  [facility];  Service: Orthopedics;  Laterality: Left;  2.5 hrs  . TUBAL LIGATION    . [place]   right    There were no vitals filed for this visit.   Subjective Assessment - 02/15/21 1401    Subjective Taking daily walks now and it feels great not to have knee pain like I used to.    Pertinent History osteopenia, breast CA, h/o DVT/PE;  left TKR 01/06/21    Currently in Pain? No/denies    Aggravating Factors  It would probably be just random    Pain Relieving Factors rest, ice    Multiple Pain Sites No                             [facility] Adult PT Treatment/Exercise - 02/15/21 0001      Knee/Hip Exercises: Aerobic   Recumbent Bike L2 x 7 min discussion of status      Knee/Hip Exercises: Machines for Strengthening   Cybex Knee Extension 1 plate LT > RT 5K93 VC to hold the extension a bit longer    Cybex Knee Flexion 15# Lt>RT 2x10    Cybex  Leg Press Seat 6: LTLE 45#10x 50# 10x2   VC for TKE     Knee/Hip Exercises: Standing   Forward Step Up Left;10 reps;Hand Hold: 2;Step Height: 6";2 sets   On BOSU   Step Down Left;1 set;10 reps;Hand Hold: 2;Step Height: 6"    Step Down Limitations Improving control    Rocker Board 3 minutes   1 min DF stretch at end of 2 min     Knee/Hip Exercises: Seated   Long Arc Quad AROM;Strengthening;Left;2 sets;10 reps;Weights    Long Arc Quad Weight 3 lbs.    Long Arc [employer] Limitations Focus on [employer]      Vasopneumatic   Number Minutes Vasopneumatic  15 minutes    Vasopnuematic Location  Knee    Vasopneumatic Pressure Medium    Vasopneumatic Temperature  3 flakes      Manual Therapy   Soft tissue mobilization Lt lateral gastroc, hamstring,   passive knee extension strteching                   PT Short Term Goals - 01/29/21 1101      PT SHORT TERM GOAL #1   Title Pt will be ind with initial HEP    Status Achieved      PT SHORT TERM GOAL #2    Title Pt will demo improved symmetry in WB through bil LEs with sit to stand from chair x 5 reps and with 3' walk test    Status Achieved      PT SHORT TERM GOAL #3   Title Pt will be able to stand for light houshold tasks and ambulation for up to 15 min with min pain.    Status Achieved      PT SHORT TERM GOAL #4   Title Pt will achieve Lt knee A/ROM of at least -10 ext and 105 flexion for improved gait and transfers.    Status Achieved      PT SHORT TERM GOAL #5   Title Pt will be able to ascend stairs using alt pattern x 5 steps and handrail for improved functional strength for entry into home.    Status Achieved             PT Long Term Goals - 02/08/21 1521      PT LONG TERM GOAL #1   Title Pt will be ind with advanced HEP and understand how to safely progress    Time 8    Period Weeks    Status On-going      PT LONG TERM GOAL #2   Title Pt will achieve improved functional strength of Lt LE for alt pattern with stairs (ascending and descending) for ease of coming/going from home    Time 8    Period Weeks    Status On-going   Descending difficult     PT LONG TERM GOAL #3   Title Pt will be able to stand for houshold tasks and ambulation for up to 30 min with min pain to improve tolerance with errands, exercise walks, house upkeep.    Time 8    Period Weeks    Status Achieved      PT LONG TERM GOAL #4   Title Improve FOTO score to at least 58% to demo less limitation    Time 8    Period Weeks    Status Achieved      PT LONG TERM GOAL #5   Title Pt will achieve at least  120 flexion of Lt knee for improved ability to squat.    Time 8    Period Weeks    Status Achieved                 Plan - 02/15/21 1400    Clinical Impression Statement Pt reports doing very well, taking recreational/fitness walking everyday without knee pain. Might have some stiffness post walks but this is also getting better. TKE continues to most weakend area of her ROM yet pt does  report "I can see slow improvement."    Personal Factors and Comorbidities Age;Comorbidity 1;Comorbidity 2;Time since onset of injury/illness/exacerbation;Comorbidity 3+    Comorbidities osteopenia, hx of breast CA, hx of DVT/PE, Lt wrist arthritis (wears brace)    Examination-Activity Limitations Locomotion Level;Bathing;Transfers;Bed Mobility;Sit;Sleep;Squat;Stairs;Stand;Toileting    Examination-Participation Restrictions Community Activity;Driving;Cleaning;Laundry;Shop    Stability/Clinical Decision Making Stable/Uncomplicated    Rehab Potential Excellent    PT Frequency 2x / week    PT Duration 8 weeks    PT Treatment/Interventions Aquatic Therapy;ADLs/Self Care Home Management;Electrical Stimulation;Cryotherapy;Moist Heat;Balance training;Therapeutic exercise;Therapeutic activities;Functional mobility training;Stair training;Gait training;Patient/family education;Neuromuscular re-education;Manual techniques;Scar mobilization;Passive range of motion;Energy conservation;Taping;Joint Manipulations;Spinal Manipulations;Vasopneumatic Device    PT Next Visit Plan Knee extension ROM and strength, leg press, vaso, strength in descending stairs    PT Home Exercise Plan Access Code: [email or web address]    Consulted and Agree with Plan of Care Patient           Patient will benefit from skilled therapeutic intervention in order to improve the following deficits and impairments:  Abnormal gait,Decreased range of motion,Difficulty walking,Postural dysfunction,Decreased strength,Decreased mobility,Impaired flexibility,Pain,Increased edema,Decreased activity tolerance,Decreased endurance  Visit Diagnosis: Acute pain of left knee  Muscle weakness (generalized)  Other abnormalities of gait and mobility     Problem List Patient Active Problem List   Diagnosis Date Noted  . Left knee DJD 01/06/2021  . Coagulopathy ([place]) 11/30/2020  . Pulmonary embolus ([place]) 11/25/2020  . DVT (deep venous thrombosis) ([place])  11/25/2020  . Presence of IVC filter 11/25/2020  . Vulvar lesion 09/16/2016  . Osteopenia 03/01/2016  . Lichen sclerosus 80/99/8338  . Cystocele 03/23/2015  . Malignant neoplasm of upper-outer quadrant of right breast in female, estrogen receptor positive ([place]) 01/10/2014    Norina Cowper, PTA 02/15/2021, 2:37 PM  [facility] [address] [place], [place], [address] Phone: [phone]   Fax:  [phone]  Name: Jessica Bautista MRN: [id number] Date of Birth: 09/07/1941

## 2021-02-16 DIAGNOSIS — Z4789 Encounter for other orthopedic aftercare: Secondary | ICD-10-CM | POA: Diagnosis not present

## 2021-02-17 ENCOUNTER — Encounter: Payer: Self-pay | Admitting: Physical Therapy

## 2021-02-17 ENCOUNTER — Ambulatory Visit: Payer: Medicare HMO | Admitting: Physical Therapy

## 2021-02-17 ENCOUNTER — Other Ambulatory Visit: Payer: Self-pay

## 2021-02-17 DIAGNOSIS — R2689 Other abnormalities of gait and mobility: Secondary | ICD-10-CM | POA: Diagnosis not present

## 2021-02-17 DIAGNOSIS — M25562 Pain in left knee: Secondary | ICD-10-CM

## 2021-02-17 DIAGNOSIS — M6281 Muscle weakness (generalized): Secondary | ICD-10-CM

## 2021-02-17 NOTE — Therapy (Signed)
[facility] [address], [place], [place], [address] Phone: [phone]   Fax:  [phone]  Physical Therapy Treatment  Patient Details  Name: Jessica Bautista MRN: [id number] Date of Birth: 1941/04/20 Referring Provider (PT): Susa Day, MD   Encounter Date: 02/17/2021   PT End of Session - 02/17/21 1358    Visit Number 13    Date for PT Re-Evaluation 04/05/21    Authorization Type A[employer]na Medicare    Progress Note Due on Visit 20    PT Start Time 1358    PT Stop Time 1450    PT Time Calculation (min) 52 min    Activity Tolerance Patient tolerated treatment well    Behavior During Therapy [facility] for tasks assessed/performed           Past Medical History:  Diagnosis Date  . Atypical nevi   . Basal cell carcinoma   . Breast cancer ([place])   . DVT (deep venous thrombosis) ([place])   . Eczema   . Family history of colonic polyps   . Fatty (change of) liver, not elsewhere classified   . Hyperlipidemia   . Hypertension   . Hypothyroidism   . Impaired fasting glucose   . Insomnia   . Lichen sclerosus et atrophicus of the vulva   . Major depression, single episode   . Nontoxic single thyroid nodule   . Osteopenia   . Psoriasis   . Pulmonary embolus ([place]) 1999   post op tummy tuc  . Skin cancer   . Thyroid disease   . Vitamin D deficiency, unspecified   . Wears glasses     Past Surgical History:  Procedure Laterality Date  . ABDOMINOPLASTY  1998  . [place]   right  . BREAST LUMPECTOMY Right 01/27/2014   malignant  . BREAST LUMPECTOMY WITH NEEDLE LOCALIZATION Right 01/27/2014   Procedure: BREAST LUMPECTOMY WITH NEEDLE LOCALIZATION;  Surgeon: Edward Jolly, MD;  Location: [place];  Service: General;  Laterality: Right;  . BREAST SURGERY    . COLONOSCOPY    . EYE SURGERY Bilateral 2012  . TOTAL KNEE ARTHROPLASTY Left 01/06/2021   Procedure: TOTAL KNEE  ARTHROPLASTY;  Surgeon: Susa Day, MD;  Location[facility];  Service: Orthopedics;  Laterality: Left;  2.5 hrs  . TUBAL LIGATION    . [place]   right    There were no vitals filed for this visit.   Subjective Assessment - 02/17/21 1400    Subjective I saw the MD yesterday and he was very pleased. I see him 1x more in September.    Pertinent History osteopenia, breast CA, h/o DVT/PE;  left TKR 01/06/21    Currently in Pain? No/denies    Multiple Pain Sites No                             [facility] Adult PT Treatment/Exercise - 02/17/21 0001      Knee/Hip Exercises: Aerobic   Recumbent Bike L2 x 7 min discussion of status      Knee/Hip Exercises: Machines for Strengthening   Cybex Knee Extension 1 plate LT > RT 4M01 VC to hold the extension a bit longer    Cybex Knee Flexion 15# Lt>RT 3x10    Cybex Leg Press Seat 6: LTLE 50# 3x10      Knee/Hip Exercises: Standing   Forward Step Up  Left;10 reps;Hand Hold: 2;Step Height: 6";2 sets   On BOSU   Step Down Left;10 reps;Hand Hold: 2;Step Height: 6";2 sets    Step Down Limitations Improving control    Rocker Board 3 minutes   1 min DF stretch at end of 2 min     Knee/Hip Exercises: Seated   Long Arc Quad AROM;Strengthening;Left;2 sets;10 reps;Weights    Long Arc Quad Weight 4 lbs.    Long Arc [employer] Limitations Focus on [employer]      Vasopneumatic   Number Minutes Vasopneumatic  15 minutes    Vasopnuematic Location  Knee    Vasopneumatic Pressure Medium    Vasopneumatic Temperature  3 flakes                    PT Short Term Goals - 01/29/21 1101      PT SHORT TERM GOAL #1   Title Pt will be ind with initial HEP    Status Achieved      PT SHORT TERM GOAL #2   Title Pt will demo improved symmetry in WB through bil LEs with sit to stand from chair x 5 reps and with 3' walk test    Status Achieved      PT SHORT TERM GOAL #3   Title Pt will be able to stand for light houshold tasks and  ambulation for up to 15 min with min pain.    Status Achieved      PT SHORT TERM GOAL #4   Title Pt will achieve Lt knee A/ROM of at least -10 ext and 105 flexion for improved gait and transfers.    Status Achieved      PT SHORT TERM GOAL #5   Title Pt will be able to ascend stairs using alt pattern x 5 steps and handrail for improved functional strength for entry into home.    Status Achieved             PT Long Term Goals - 02/08/21 1521      PT LONG TERM GOAL #1   Title Pt will be ind with advanced HEP and understand how to safely progress    Time 8    Period Weeks    Status On-going      PT LONG TERM GOAL #2   Title Pt will achieve improved functional strength of Lt LE for alt pattern with stairs (ascending and descending) for ease of coming/going from home    Time 8    Period Weeks    Status On-going   Descending difficult     PT LONG TERM GOAL #3   Title Pt will be able to stand for houshold tasks and ambulation for up to 30 min with min pain to improve tolerance with errands, exercise walks, house upkeep.    Time 8    Period Weeks    Status Achieved      PT LONG TERM GOAL #4   Title Improve FOTO score to at least 58% to demo less limitation    Time 8    Period Weeks    Status Achieved      PT LONG TERM GOAL #5   Title Pt will achieve at least 120 flexion of Lt knee for improved ability to squat.    Time 8    Period Weeks    Status Achieved                 Plan - 02/17/21 1412  Clinical Impression Statement Pt saw MD yesterday and he was very pleased with her progress. Pt woul dlike to finish out the month of April and then DC to HEP as she has vacation plans in early May. Pt continues to work on her Lt quad strength, increasing resistance slightly. No pain. Game Ready continues to help reduce edema.    Personal Factors and Comorbidities Age;Comorbidity 1;Comorbidity 2;Time since onset of injury/illness/exacerbation;Comorbidity 3+    Comorbidities  osteopenia, hx of breast CA, hx of DVT/PE, Lt wrist arthritis (wears brace)    Examination-Activity Limitations Locomotion Level;Bathing;Transfers;Bed Mobility;Sit;Sleep;Squat;Stairs;Stand;Toileting    Examination-Participation Restrictions Community Activity;Driving;Cleaning;Laundry;Shop    Stability/Clinical Decision Making Stable/Uncomplicated    Rehab Potential Excellent    PT Frequency 2x / week    PT Duration 8 weeks    PT Treatment/Interventions Aquatic Therapy;ADLs/Self Care Home Management;Electrical Stimulation;Cryotherapy;Moist Heat;Balance training;Therapeutic exercise;Therapeutic activities;Functional mobility training;Stair training;Gait training;Patient/family education;Neuromuscular re-education;Manual techniques;Scar mobilization;Passive range of motion;Energy conservation;Taping;Joint Manipulations;Spinal Manipulations;Vasopneumatic Device    PT Next Visit Plan Pt would like visit on 4/27 to be her last,she will be going on vacation.    PT Home Exercise Plan Access Code: [email or web address]    Consulted and Agree with Plan of Care Patient           Patient will benefit from skilled therapeutic intervention in order to improve the following deficits and impairments:  Abnormal gait,Decreased range of motion,Difficulty walking,Postural dysfunction,Decreased strength,Decreased mobility,Impaired flexibility,Pain,Increased edema,Decreased activity tolerance,Decreased endurance  Visit Diagnosis: Acute pain of left knee  Muscle weakness (generalized)  Other abnormalities of gait and mobility     Problem List Patient Active Problem List   Diagnosis Date Noted  . Left knee DJD 01/06/2021  . Coagulopathy ([place]) 11/30/2020  . Pulmonary embolus ([place]) 11/25/2020  . DVT (deep venous thrombosis) ([place]) 11/25/2020  . Presence of IVC filter 11/25/2020  . Vulvar lesion 09/16/2016  . Osteopenia 03/01/2016  . Lichen sclerosus 49/44/9675  . Cystocele 03/23/2015  . Malignant neoplasm of  upper-outer quadrant of right breast in female, estrogen receptor positive ([place]) 01/10/2014    Rashad Obeid, PTA 02/17/2021, 2:39 PM  [facility] [address] [place], [place], [address] Phone: [phone]   Fax:  [phone]  Name: Jessica Bautista MRN: [id number] Date of Birth: Jun 07, 1941

## 2021-02-19 DIAGNOSIS — E039 Hypothyroidism, unspecified: Secondary | ICD-10-CM | POA: Diagnosis not present

## 2021-02-19 DIAGNOSIS — R69 Illness, unspecified: Secondary | ICD-10-CM | POA: Diagnosis not present

## 2021-02-19 DIAGNOSIS — Z853 Personal history of malignant neoplasm of breast: Secondary | ICD-10-CM | POA: Diagnosis not present

## 2021-02-19 DIAGNOSIS — I10 Essential (primary) hypertension: Secondary | ICD-10-CM | POA: Diagnosis not present

## 2021-02-19 DIAGNOSIS — E782 Mixed hyperlipidemia: Secondary | ICD-10-CM | POA: Diagnosis not present

## 2021-02-19 DIAGNOSIS — G4700 Insomnia, unspecified: Secondary | ICD-10-CM | POA: Diagnosis not present

## 2021-02-19 DIAGNOSIS — C50919 Malignant neoplasm of unspecified site of unspecified female breast: Secondary | ICD-10-CM | POA: Diagnosis not present

## 2021-02-22 ENCOUNTER — Encounter: Payer: Medicare HMO | Admitting: Physical Therapy

## 2021-02-24 ENCOUNTER — Encounter: Payer: Self-pay | Admitting: Physical Therapy

## 2021-02-24 ENCOUNTER — Other Ambulatory Visit: Payer: Self-pay

## 2021-02-24 ENCOUNTER — Ambulatory Visit: Payer: Medicare HMO | Admitting: Physical Therapy

## 2021-02-24 DIAGNOSIS — M25532 Pain in left wrist: Secondary | ICD-10-CM | POA: Diagnosis not present

## 2021-02-24 DIAGNOSIS — M79642 Pain in left hand: Secondary | ICD-10-CM | POA: Diagnosis not present

## 2021-02-24 DIAGNOSIS — R2689 Other abnormalities of gait and mobility: Secondary | ICD-10-CM

## 2021-02-24 DIAGNOSIS — M25562 Pain in left knee: Secondary | ICD-10-CM | POA: Diagnosis not present

## 2021-02-24 DIAGNOSIS — M6281 Muscle weakness (generalized): Secondary | ICD-10-CM

## 2021-02-24 DIAGNOSIS — M069 Rheumatoid arthritis, unspecified: Secondary | ICD-10-CM | POA: Diagnosis not present

## 2021-02-24 NOTE — Therapy (Addendum)
[facility] [address], [place], [place], [address] Phone: [phone]   Fax:  [phone]  Physical Therapy Treatment  Patient Details  Name: Jessica Bautista MRN: [id number] Date of Birth: 08/28/1941 Referring Provider (PT): Susa Day, MD   Encounter Date: 02/24/2021   PT End of Session - 02/24/21 1401    Visit Number 14    Date for PT Re-Evaluation 04/05/21    Authorization Type Aetna Medicare    Progress Note Due on Visit 20    PT Start Time 1401    PT Stop Time 1455    PT Time Calculation (min) 54 min    Activity Tolerance Patient tolerated treatment well    Behavior During Therapy [facility] for tasks assessed/performed           Past Medical History:  Diagnosis Date  . Atypical nevi   . Basal cell carcinoma   . Breast cancer ([place])   . DVT (deep venous thrombosis) ([place])   . Eczema   . Family history of colonic polyps   . Fatty (change of) liver, not elsewhere classified   . Hyperlipidemia   . Hypertension   . Hypothyroidism   . Impaired fasting glucose   . Insomnia   . Lichen sclerosus et atrophicus of the vulva   . Major depression, single episode   . Nontoxic single thyroid nodule   . Osteopenia   . Psoriasis   . Pulmonary embolus ([place]) 1999   post op tummy tuc  . Skin cancer   . Thyroid disease   . Vitamin D deficiency, unspecified   . Wears glasses     Past Surgical History:  Procedure Laterality Date  . ABDOMINOPLASTY  1998  . [place]   right  . BREAST LUMPECTOMY Right 01/27/2014   malignant  . BREAST LUMPECTOMY WITH NEEDLE LOCALIZATION Right 01/27/2014   Procedure: BREAST LUMPECTOMY WITH NEEDLE LOCALIZATION;  Surgeon: Edward Jolly, MD;  Location: [place];  Service: General;  Laterality: Right;  . BREAST SURGERY    . COLONOSCOPY    . EYE SURGERY Bilateral 2012  . TOTAL KNEE ARTHROPLASTY Left 01/06/2021   Procedure: TOTAL KNEE  ARTHROPLASTY;  Surgeon: Susa Day, MD;  [facility];  Service: Orthopedics;  Laterality: Left;  2.5 hrs  . TUBAL LIGATION    . [place]   right    There were no vitals filed for this visit.   Subjective Assessment - 02/24/21 1409    Subjective I am super pleased with my knee and its progress    Pertinent History osteopenia, breast CA, h/o DVT/PE;  left TKR 01/06/21    Currently in Pain? No/denies    Multiple Pain Sites No                             [facility] Adult PT Treatment/Exercise - 02/24/21 0001      Knee/Hip Exercises: Aerobic   Recumbent Bike L2 x 9 min with PTA present to discuss      Knee/Hip Exercises: Machines for Strengthening   Cybex Knee Extension 2 plates Lt > RT 4T65    Cybex Knee Flexion 20# Lt > RT 3x10    Cybex Leg Press Seat 6: LTLE 50# 3x10      Knee/Hip Exercises: Standing   Forward Step Up Left;10 reps;Hand Hold: 2;Step Height: 6";2 sets   On BOSU  Rocker Board 3 minutes   1 min DF stretch at end of 2 min     Knee/Hip Exercises: Seated   Long Arc Quad AROM;Strengthening;Left;2 sets;10 reps;Weights    Long Arc Quad Weight 4 lbs.    Long Arc Quad Limitations Focus on [employer]                    PT Short Term Goals - 01/29/21 1101      PT SHORT TERM GOAL #1   Title Pt will be ind with initial HEP    Status Achieved      PT SHORT TERM GOAL #2   Title Pt will demo improved symmetry in WB through bil LEs with sit to stand from chair x 5 reps and with 3' walk test    Status Achieved      PT SHORT TERM GOAL #3   Title Pt will be able to stand for light houshold tasks and ambulation for up to 15 min with min pain.    Status Achieved      PT SHORT TERM GOAL #4   Title Pt will achieve Lt knee A/ROM of at least -10 ext and 105 flexion for improved gait and transfers.    Status Achieved      PT SHORT TERM GOAL #5   Title Pt will be able to ascend stairs using alt pattern x 5 steps and handrail for  improved functional strength for entry into home.    Status Achieved             PT Long Term Goals - 02/24/21 1428      PT LONG TERM GOAL #2   Title Pt will achieve improved functional strength of Lt LE for alt pattern with stairs (ascending and descending) for ease of coming/going from home    Time 8    Period Weeks    Status On-going   continues to improve                Plan - 02/24/21 1401    Clinical Impression Statement Pt doing well, arrives with no complaints and rpeorts she is ready for discharge next week.    Personal Factors and Comorbidities Age;Comorbidity 1;Comorbidity 2;Time since onset of injury/illness/exacerbation;Comorbidity 3+    Comorbidities osteopenia, hx of breast CA, hx of DVT/PE, Lt wrist arthritis (wears brace)    Examination-Activity Limitations Locomotion Level;Bathing;Transfers;Bed Mobility;Sit;Sleep;Squat;Stairs;Stand;Toileting    Examination-Participation Restrictions Community Activity;Driving;Cleaning;Laundry;Shop    Stability/Clinical Decision Making Stable/Uncomplicated    PT Frequency 2x / week    PT Duration 8 weeks    PT Treatment/Interventions Aquatic Therapy;ADLs/Self Care Home Management;Electrical Stimulation;Cryotherapy;Moist Heat;Balance training;Therapeutic exercise;Therapeutic activities;Functional mobility training;Stair training;Gait training;Patient/family education;Neuromuscular re-education;Manual techniques;Scar mobilization;Passive range of motion;Energy conservation;Taping;Joint Manipulations;Spinal Manipulations;Vasopneumatic Device    PT Next Visit Plan DC next week per pt request.    PT Home Exercise Plan Access Code: [email or web address]    Consulted and Agree with Plan of Care Patient           Patient will benefit from skilled therapeutic intervention in order to improve the following deficits and impairments:  Abnormal gait,Decreased range of motion,Difficulty walking,Postural dysfunction,Decreased strength,Decreased  mobility,Impaired flexibility,Pain,Increased edema,Decreased activity tolerance,Decreased endurance  Visit Diagnosis: Acute pain of left knee  Muscle weakness (generalized)  Other abnormalities of gait and mobility     Problem List Patient Active Problem List   Diagnosis Date Noted  . Left knee DJD 01/06/2021  . Coagulopathy ([place]) 11/30/2020  . Pulmonary embolus ([place])  11/25/2020  . DVT (deep venous thrombosis) ([place]) 11/25/2020  . Presence of IVC filter 11/25/2020  . Vulvar lesion 09/16/2016  . Osteopenia 03/01/2016  . Lichen sclerosus 07/30/3006  . Cystocele 03/23/2015  . Malignant neoplasm of upper-outer quadrant of right breast in female, estrogen receptor positive ([place]) 01/10/2014    Jessica Bautista, PTA 02/24/2021, 2:36 PM  [facility] [address], [place], [place], [address] Phone: [phone]   Fax:  [phone]  Name: Jessica Bautista MRN: [id number] Date of Birth: 11-27-1940  Addendum: Jessica Bautista post 15 min to LT knee

## 2021-02-26 ENCOUNTER — Encounter: Payer: Medicare HMO | Admitting: Physical Therapy

## 2021-03-01 ENCOUNTER — Ambulatory Visit: Payer: Medicare HMO | Admitting: Physical Therapy

## 2021-03-01 ENCOUNTER — Other Ambulatory Visit: Payer: Self-pay

## 2021-03-01 ENCOUNTER — Encounter: Payer: Self-pay | Admitting: Physical Therapy

## 2021-03-01 DIAGNOSIS — M25562 Pain in left knee: Secondary | ICD-10-CM

## 2021-03-01 DIAGNOSIS — R2689 Other abnormalities of gait and mobility: Secondary | ICD-10-CM

## 2021-03-01 DIAGNOSIS — M6281 Muscle weakness (generalized): Secondary | ICD-10-CM | POA: Diagnosis not present

## 2021-03-01 NOTE — Therapy (Addendum)
[facility] [address] [place], [place], [address] Phone: [phone]   Fax:  [phone]  Physical Therapy Treatment/Discharge Summary   Patient Details  Name: Jessica Bautista MRN: [id number] Date of Birth: 1941-05-18 Referring Provider (PT): Susa Day, MD   Encounter Date: 03/01/2021   PT End of Session - 03/01/21 1403    Visit Number 15    Date for PT Re-Evaluation 04/05/21    Authorization Type Aetna Medicare    Progress Note Due on Visit 38    PT Start Time 1359   Pt ended session early "had things to do.'   PT Stop Time 1435    PT Time Calculation (min) 36 min    Activity Tolerance Patient tolerated treatment well    Behavior During Therapy [facility] for tasks assessed/performed           Past Medical History:  Diagnosis Date  . Atypical nevi   . Basal cell carcinoma   . Breast cancer ([place])   . DVT (deep venous thrombosis) ([place])   . Eczema   . Family history of colonic polyps   . Fatty (change of) liver, not elsewhere classified   . Hyperlipidemia   . Hypertension   . Hypothyroidism   . Impaired fasting glucose   . Insomnia   . Lichen sclerosus et atrophicus of the vulva   . Major depression, single episode   . Nontoxic single thyroid nodule   . Osteopenia   . Psoriasis   . Pulmonary embolus ([place]) 1999   post op tummy tuc  . Skin cancer   . Thyroid disease   . Vitamin D deficiency, unspecified   . Wears glasses     Past Surgical History:  Procedure Laterality Date  . ABDOMINOPLASTY  1998  . [place]   right  . BREAST LUMPECTOMY Right 01/27/2014   malignant  . BREAST LUMPECTOMY WITH NEEDLE LOCALIZATION Right 01/27/2014   Procedure: BREAST LUMPECTOMY WITH NEEDLE LOCALIZATION;  Surgeon: Edward Jolly, MD;  Location: [address];  Service: General;  Laterality: Right;  . BREAST SURGERY    . COLONOSCOPY    . EYE SURGERY Bilateral 2012  . TOTAL  KNEE ARTHROPLASTY Left 01/06/2021   Procedure: TOTAL KNEE ARTHROPLASTY;  Surgeon: Susa Day, MD;  [facility];  Service: Orthopedics;  Laterality: Left;  2.5 hrs  . TUBAL LIGATION    . [place]   right    There were no vitals filed for this visit.   Subjective Assessment - 03/01/21 1401    Subjective I feel 100% better! I would like today to be my last day.    Pertinent History osteopenia, breast CA, h/o DVT/PE;  left TKR 01/06/21    Limitations Walking;House hold activities;Standing;Sitting    Currently in Pain? No/denies    Multiple Pain Sites No              [facility] PT Assessment - 03/01/21 0001      Assessment   Medical Diagnosis Z96.652 (ICD-10-CM) - Presence of left artificial knee joint    Referring Provider (PT) Susa Day, MD    Onset Date/Surgical Date 01/06/21      Observation/Other Assessments   Focus on Therapeutic Outcomes (FOTO)  63%      AROM   Left Knee Extension 3    Left Knee Flexion 121  [facility] Adult PT Treatment/Exercise - 03/01/21 0001      Knee/Hip Exercises: Aerobic   Recumbent Bike L2 x 9 min with PTA present to discuss      Knee/Hip Exercises: Machines for Strengthening   Cybex Knee Extension 2 plates Lt > RT 7Z72    Cybex Knee Flexion 20# Lt > RT 3x10    Cybex Leg Press Seat 6: LTLE 50# x10 then 60# 10x   Bil 65# 15x     Knee/Hip Exercises: Standing   Forward Step Up Left;10 reps;Hand Hold: 2;Step Height: 6";2 sets   On BOSU   Step Down Left;10 reps;Hand Hold: 2;Step Height: 6";2 sets    Rocker Board 3 minutes   1 min DF stretch at end of 2 min     Knee/Hip Exercises: Seated   Long Arc Quad AROM;Strengthening;Left;2 sets;10 reps;Weights    Long Arc Quad Weight 4 lbs.    Long Arc [employer] Limitations Focus on [employer]      Vasopneumatic   Number Minutes Vasopneumatic  --    Vasopnuematic Location  --    Vasopneumatic Pressure --    Vasopneumatic Temperature  --                     PT Short Term Goals - 01/29/21 1101      PT SHORT TERM GOAL #1   Title Pt will be ind with initial HEP    Status Achieved      PT SHORT TERM GOAL #2   Title Pt will demo improved symmetry in WB through bil LEs with sit to stand from chair x 5 reps and with 3' walk test    Status Achieved      PT SHORT TERM GOAL #3   Title Pt will be able to stand for light houshold tasks and ambulation for up to 15 min with min pain.    Status Achieved      PT SHORT TERM GOAL #4   Title Pt will achieve Lt knee A/ROM of at least -10 ext and 105 flexion for improved gait and transfers.    Status Achieved      PT SHORT TERM GOAL #5   Title Pt will be able to ascend stairs using alt pattern x 5 steps and handrail for improved functional strength for entry into home.    Status Achieved             PT Long Term Goals - 03/01/21 1404      PT LONG TERM GOAL #1   Title Pt will be ind with advanced HEP and understand how to safely progress    Time 8    Period Weeks    Status Achieved   Pt will be going to the gym to continue with her HEP     PT LONG TERM GOAL #2   Title Pt will achieve improved functional strength of Lt LE for alt pattern with stairs (ascending and descending) for ease of coming/going from home    Time 8    Period Weeks    Status Achieved      PT LONG TERM GOAL #3   Title Pt will be able to stand for houshold tasks and ambulation for up to 30 min with min pain to improve tolerance with errands, exercise walks, house upkeep.    Time 8    Period Weeks    Status Achieved      PT LONG TERM GOAL #4  Title Improve FOTO score to at least 58% to demo less limitation    Baseline 37%    Time 8    Period Weeks    Status Achieved      PT LONG TERM GOAL #5   Title Pt will achieve at least 120 flexion of Lt knee for improved ability to squat.    Period Weeks    Status Achieved   3-121                Plan - 03/01/21 1403    Clinical Impression  Statement Pt arrives reporting she feels 100% better and would like today to be her last session. She plans to continue with the gym and eventual return to water yoga. All long term goals are met and pt is very pleased with her outcome so far.    Personal Factors and Comorbidities Age;Comorbidity 1;Comorbidity 2;Time since onset of injury/illness/exacerbation;Comorbidity 3+    Comorbidities osteopenia, hx of breast CA, hx of DVT/PE, Lt wrist arthritis (wears brace)    Examination-Activity Limitations Locomotion Level;Bathing;Transfers;Bed Mobility;Sit;Sleep;Squat;Stairs;Stand;Toileting    Examination-Participation Restrictions Community Activity;Driving;Cleaning;Laundry;Shop    Stability/Clinical Decision Making Stable/Uncomplicated    Rehab Potential Excellent    PT Frequency 2x / week    PT Duration 8 weeks    PT Treatment/Interventions Aquatic Therapy;ADLs/Self Care Home Management;Electrical Stimulation;Cryotherapy;Moist Heat;Balance training;Therapeutic exercise;Therapeutic activities;Functional mobility training;Stair training;Gait training;Patient/family education;Neuromuscular re-education;Manual techniques;Scar mobilization;Passive range of motion;Energy conservation;Taping;Joint Manipulations;Spinal Manipulations;Vasopneumatic Device    PT Next Visit Plan Discharge per pt being pleased with progress    PT Home Exercise Plan Access Code: [email or web address]    Consulted and Agree with Plan of Care Patient           Patient will benefit from skilled therapeutic intervention in order to improve the following deficits and impairments:  Abnormal gait,Decreased range of motion,Difficulty walking,Postural dysfunction,Decreased strength,Decreased mobility,Impaired flexibility,Pain,Increased edema,Decreased activity tolerance,Decreased endurance  Visit Diagnosis: Acute pain of left knee  Muscle weakness (generalized)  Other abnormalities of gait and mobility    PHYSICAL THERAPY DISCHARGE  SUMMARY  Visits from Start of Care: 15  Current functional level related to goals / functional outcomes: See clinical impressions above   Remaining deficits: As above   Education / Equipment: HEP Plan: Patient agrees to discharge.  Patient goals were met. Patient is being discharged due to meeting the stated rehab goals.  ?????      Problem List Patient Active Problem List   Diagnosis Date Noted  . Left knee DJD 01/06/2021  . Coagulopathy ([place]) 11/30/2020  . Pulmonary embolus ([place]) 11/25/2020  . DVT (deep venous thrombosis) ([place]) 11/25/2020  . Presence of IVC filter 11/25/2020  . Vulvar lesion 09/16/2016  . Osteopenia 03/01/2016  . Lichen sclerosus 63/84/5364  . Cystocele 03/23/2015  . Malignant neoplasm of upper-outer quadrant of right breast in female, estrogen receptor positive ([place]) 01/10/2014     Ruben Im, PT 03/05/21 9:21 AM Phone: [phone] Fax: [phone] Myrene Galas, PTA 03/01/21 2:36 PM  03/01/2021, 2:35 PM  [facility] [address], [place], [place], [address] Phone: [phone]   Fax:  [phone]  Name: Jessica Bautista MRN: [id number] Date of Birth: Apr 09, 1941

## 2021-03-03 ENCOUNTER — Encounter: Payer: Medicare HMO | Admitting: Physical Therapy

## 2021-03-05 ENCOUNTER — Encounter: Payer: Medicare HMO | Admitting: Physical Therapy

## 2021-03-08 ENCOUNTER — Encounter: Payer: Medicare HMO | Admitting: Physical Therapy

## 2021-03-10 ENCOUNTER — Encounter: Payer: Medicare HMO | Admitting: Physical Therapy

## 2021-03-12 ENCOUNTER — Encounter: Payer: Medicare HMO | Admitting: Physical Therapy

## 2021-03-15 ENCOUNTER — Other Ambulatory Visit: Payer: Self-pay | Admitting: Family Medicine

## 2021-03-15 ENCOUNTER — Encounter: Payer: Medicare HMO | Admitting: Physical Therapy

## 2021-03-16 DIAGNOSIS — Z1389 Encounter for screening for other disorder: Secondary | ICD-10-CM | POA: Diagnosis not present

## 2021-03-16 DIAGNOSIS — Z0000 Encounter for general adult medical examination without abnormal findings: Secondary | ICD-10-CM | POA: Diagnosis not present

## 2021-03-17 ENCOUNTER — Encounter: Payer: Medicare HMO | Admitting: Physical Therapy

## 2021-03-17 DIAGNOSIS — B354 Tinea corporis: Secondary | ICD-10-CM | POA: Diagnosis not present

## 2021-03-17 DIAGNOSIS — R69 Illness, unspecified: Secondary | ICD-10-CM | POA: Diagnosis not present

## 2021-03-17 DIAGNOSIS — E7800 Pure hypercholesterolemia, unspecified: Secondary | ICD-10-CM | POA: Diagnosis not present

## 2021-03-17 DIAGNOSIS — L299 Pruritus, unspecified: Secondary | ICD-10-CM | POA: Diagnosis not present

## 2021-03-17 DIAGNOSIS — Z0001 Encounter for general adult medical examination with abnormal findings: Secondary | ICD-10-CM | POA: Diagnosis not present

## 2021-03-17 DIAGNOSIS — N644 Mastodynia: Secondary | ICD-10-CM | POA: Diagnosis not present

## 2021-03-17 DIAGNOSIS — G4700 Insomnia, unspecified: Secondary | ICD-10-CM | POA: Diagnosis not present

## 2021-03-17 DIAGNOSIS — E039 Hypothyroidism, unspecified: Secondary | ICD-10-CM | POA: Diagnosis not present

## 2021-03-18 ENCOUNTER — Other Ambulatory Visit: Payer: Self-pay | Admitting: Family Medicine

## 2021-03-18 DIAGNOSIS — N644 Mastodynia: Secondary | ICD-10-CM

## 2021-03-18 DIAGNOSIS — Z853 Personal history of malignant neoplasm of breast: Secondary | ICD-10-CM

## 2021-03-19 ENCOUNTER — Encounter: Payer: Medicare HMO | Admitting: Physical Therapy

## 2021-03-23 ENCOUNTER — Encounter: Payer: Medicare HMO | Admitting: Physical Therapy

## 2021-03-25 ENCOUNTER — Encounter: Payer: Medicare HMO | Admitting: Physical Therapy

## 2021-03-26 ENCOUNTER — Encounter: Payer: Medicare HMO | Admitting: Physical Therapy

## 2021-03-29 ENCOUNTER — Encounter: Payer: Medicare HMO | Admitting: Physical Therapy

## 2021-03-31 ENCOUNTER — Encounter: Payer: Medicare HMO | Admitting: Physical Therapy

## 2021-04-02 ENCOUNTER — Encounter: Payer: Medicare HMO | Admitting: Physical Therapy

## 2021-04-13 DIAGNOSIS — M79642 Pain in left hand: Secondary | ICD-10-CM | POA: Diagnosis not present

## 2021-04-13 DIAGNOSIS — I10 Essential (primary) hypertension: Secondary | ICD-10-CM | POA: Diagnosis not present

## 2021-04-13 DIAGNOSIS — Z853 Personal history of malignant neoplasm of breast: Secondary | ICD-10-CM | POA: Diagnosis not present

## 2021-04-13 DIAGNOSIS — E039 Hypothyroidism, unspecified: Secondary | ICD-10-CM | POA: Diagnosis not present

## 2021-04-13 DIAGNOSIS — M25532 Pain in left wrist: Secondary | ICD-10-CM | POA: Diagnosis not present

## 2021-04-13 DIAGNOSIS — G4700 Insomnia, unspecified: Secondary | ICD-10-CM | POA: Diagnosis not present

## 2021-04-13 DIAGNOSIS — C50919 Malignant neoplasm of unspecified site of unspecified female breast: Secondary | ICD-10-CM | POA: Diagnosis not present

## 2021-04-13 DIAGNOSIS — R69 Illness, unspecified: Secondary | ICD-10-CM | POA: Diagnosis not present

## 2021-04-13 DIAGNOSIS — E7800 Pure hypercholesterolemia, unspecified: Secondary | ICD-10-CM | POA: Diagnosis not present

## 2021-04-13 DIAGNOSIS — E782 Mixed hyperlipidemia: Secondary | ICD-10-CM | POA: Diagnosis not present

## 2021-04-15 DIAGNOSIS — C44319 Basal cell carcinoma of skin of other parts of face: Secondary | ICD-10-CM | POA: Diagnosis not present

## 2021-04-15 DIAGNOSIS — C44311 Basal cell carcinoma of skin of nose: Secondary | ICD-10-CM | POA: Diagnosis not present

## 2021-04-21 DIAGNOSIS — G4700 Insomnia, unspecified: Secondary | ICD-10-CM | POA: Diagnosis not present

## 2021-04-21 DIAGNOSIS — L299 Pruritus, unspecified: Secondary | ICD-10-CM | POA: Diagnosis not present

## 2021-04-21 DIAGNOSIS — B354 Tinea corporis: Secondary | ICD-10-CM | POA: Diagnosis not present

## 2021-04-21 DIAGNOSIS — E7800 Pure hypercholesterolemia, unspecified: Secondary | ICD-10-CM | POA: Diagnosis not present

## 2021-04-21 DIAGNOSIS — R69 Illness, unspecified: Secondary | ICD-10-CM | POA: Diagnosis not present

## 2021-04-21 DIAGNOSIS — N644 Mastodynia: Secondary | ICD-10-CM | POA: Diagnosis not present

## 2021-04-21 DIAGNOSIS — E039 Hypothyroidism, unspecified: Secondary | ICD-10-CM | POA: Diagnosis not present

## 2021-04-22 ENCOUNTER — Ambulatory Visit
Admission: RE | Admit: 2021-04-22 | Discharge: 2021-04-22 | Disposition: A | Discharge status: Home / Self Care | Payer: Medicare HMO | Source: Ambulatory Visit | Attending: Family Medicine | Admitting: Family Medicine

## 2021-04-22 ENCOUNTER — Other Ambulatory Visit: Payer: Self-pay

## 2021-04-22 ENCOUNTER — Ambulatory Visit: Payer: Medicare HMO

## 2021-04-22 DIAGNOSIS — N644 Mastodynia: Secondary | ICD-10-CM

## 2021-04-22 DIAGNOSIS — Z853 Personal history of malignant neoplasm of breast: Secondary | ICD-10-CM

## 2021-04-22 DIAGNOSIS — R922 Inconclusive mammogram: Secondary | ICD-10-CM | POA: Diagnosis not present

## 2021-04-29 DIAGNOSIS — J0110 Acute frontal sinusitis, unspecified: Secondary | ICD-10-CM | POA: Diagnosis not present

## 2021-05-17 DIAGNOSIS — C44319 Basal cell carcinoma of skin of other parts of face: Secondary | ICD-10-CM | POA: Diagnosis not present

## 2021-05-17 DIAGNOSIS — C44311 Basal cell carcinoma of skin of nose: Secondary | ICD-10-CM | POA: Diagnosis not present

## 2021-07-05 DIAGNOSIS — Z85828 Personal history of other malignant neoplasm of skin: Secondary | ICD-10-CM | POA: Diagnosis not present

## 2021-07-05 DIAGNOSIS — L400 Psoriasis vulgaris: Secondary | ICD-10-CM | POA: Diagnosis not present

## 2021-07-05 DIAGNOSIS — L304 Erythema intertrigo: Secondary | ICD-10-CM | POA: Diagnosis not present

## 2021-07-05 DIAGNOSIS — D1801 Hemangioma of skin and subcutaneous tissue: Secondary | ICD-10-CM | POA: Diagnosis not present

## 2021-07-05 DIAGNOSIS — D2272 Melanocytic nevi of left lower limb, including hip: Secondary | ICD-10-CM | POA: Diagnosis not present

## 2021-07-05 DIAGNOSIS — D692 Other nonthrombocytopenic purpura: Secondary | ICD-10-CM | POA: Diagnosis not present

## 2021-07-05 DIAGNOSIS — D225 Melanocytic nevi of trunk: Secondary | ICD-10-CM | POA: Diagnosis not present

## 2021-07-05 DIAGNOSIS — D485 Neoplasm of uncertain behavior of skin: Secondary | ICD-10-CM | POA: Diagnosis not present

## 2021-07-05 DIAGNOSIS — L821 Other seborrheic keratosis: Secondary | ICD-10-CM | POA: Diagnosis not present

## 2021-07-05 DIAGNOSIS — D2271 Melanocytic nevi of right lower limb, including hip: Secondary | ICD-10-CM | POA: Diagnosis not present

## 2021-07-08 DIAGNOSIS — H6123 Impacted cerumen, bilateral: Secondary | ICD-10-CM | POA: Diagnosis not present

## 2021-07-19 DIAGNOSIS — G4700 Insomnia, unspecified: Secondary | ICD-10-CM | POA: Diagnosis not present

## 2021-07-19 DIAGNOSIS — E782 Mixed hyperlipidemia: Secondary | ICD-10-CM | POA: Diagnosis not present

## 2021-07-19 DIAGNOSIS — I10 Essential (primary) hypertension: Secondary | ICD-10-CM | POA: Diagnosis not present

## 2021-07-19 DIAGNOSIS — E039 Hypothyroidism, unspecified: Secondary | ICD-10-CM | POA: Diagnosis not present

## 2021-07-19 DIAGNOSIS — R69 Illness, unspecified: Secondary | ICD-10-CM | POA: Diagnosis not present

## 2021-07-19 DIAGNOSIS — Z96659 Presence of unspecified artificial knee joint: Secondary | ICD-10-CM | POA: Diagnosis not present

## 2021-07-19 DIAGNOSIS — E7800 Pure hypercholesterolemia, unspecified: Secondary | ICD-10-CM | POA: Diagnosis not present

## 2021-08-15 DIAGNOSIS — R0981 Nasal congestion: Secondary | ICD-10-CM | POA: Diagnosis not present

## 2021-08-15 DIAGNOSIS — R051 Acute cough: Secondary | ICD-10-CM | POA: Diagnosis not present

## 2021-08-15 DIAGNOSIS — J209 Acute bronchitis, unspecified: Secondary | ICD-10-CM | POA: Diagnosis not present

## 2021-08-19 DIAGNOSIS — R062 Wheezing: Secondary | ICD-10-CM | POA: Diagnosis not present

## 2021-08-19 DIAGNOSIS — R059 Cough, unspecified: Secondary | ICD-10-CM | POA: Diagnosis not present

## 2021-08-22 DIAGNOSIS — E785 Hyperlipidemia, unspecified: Secondary | ICD-10-CM | POA: Diagnosis not present

## 2021-08-22 DIAGNOSIS — F3342 Major depressive disorder, recurrent, in full remission: Secondary | ICD-10-CM | POA: Diagnosis not present

## 2021-08-22 DIAGNOSIS — Z87891 Personal history of nicotine dependence: Secondary | ICD-10-CM | POA: Diagnosis not present

## 2021-08-22 DIAGNOSIS — Z91048 Other nonmedicinal substance allergy status: Secondary | ICD-10-CM | POA: Diagnosis not present

## 2021-08-22 DIAGNOSIS — E039 Hypothyroidism, unspecified: Secondary | ICD-10-CM | POA: Diagnosis not present

## 2021-08-22 DIAGNOSIS — R69 Illness, unspecified: Secondary | ICD-10-CM | POA: Diagnosis not present

## 2021-08-22 DIAGNOSIS — Z818 Family history of other mental and behavioral disorders: Secondary | ICD-10-CM | POA: Diagnosis not present

## 2021-08-22 DIAGNOSIS — R030 Elevated blood-pressure reading, without diagnosis of hypertension: Secondary | ICD-10-CM | POA: Diagnosis not present

## 2021-08-22 DIAGNOSIS — Z8249 Family history of ischemic heart disease and other diseases of the circulatory system: Secondary | ICD-10-CM | POA: Diagnosis not present

## 2021-08-22 DIAGNOSIS — E669 Obesity, unspecified: Secondary | ICD-10-CM | POA: Diagnosis not present

## 2021-08-22 DIAGNOSIS — Z6830 Body mass index (BMI) 30.0-30.9, adult: Secondary | ICD-10-CM | POA: Diagnosis not present

## 2021-08-22 DIAGNOSIS — Z809 Family history of malignant neoplasm, unspecified: Secondary | ICD-10-CM | POA: Diagnosis not present

## 2021-09-09 DIAGNOSIS — I10 Essential (primary) hypertension: Secondary | ICD-10-CM | POA: Diagnosis not present

## 2021-09-09 DIAGNOSIS — E039 Hypothyroidism, unspecified: Secondary | ICD-10-CM | POA: Diagnosis not present

## 2021-09-09 DIAGNOSIS — E7800 Pure hypercholesterolemia, unspecified: Secondary | ICD-10-CM | POA: Diagnosis not present

## 2021-09-09 DIAGNOSIS — R69 Illness, unspecified: Secondary | ICD-10-CM | POA: Diagnosis not present

## 2021-09-09 DIAGNOSIS — G4700 Insomnia, unspecified: Secondary | ICD-10-CM | POA: Diagnosis not present

## 2021-09-09 DIAGNOSIS — E782 Mixed hyperlipidemia: Secondary | ICD-10-CM | POA: Diagnosis not present

## 2021-10-13 DIAGNOSIS — R69 Illness, unspecified: Secondary | ICD-10-CM | POA: Diagnosis not present

## 2021-10-13 DIAGNOSIS — G4700 Insomnia, unspecified: Secondary | ICD-10-CM | POA: Diagnosis not present

## 2021-10-13 DIAGNOSIS — E7800 Pure hypercholesterolemia, unspecified: Secondary | ICD-10-CM | POA: Diagnosis not present

## 2021-10-13 DIAGNOSIS — E039 Hypothyroidism, unspecified: Secondary | ICD-10-CM | POA: Diagnosis not present

## 2021-10-13 DIAGNOSIS — E782 Mixed hyperlipidemia: Secondary | ICD-10-CM | POA: Diagnosis not present

## 2021-10-13 DIAGNOSIS — I10 Essential (primary) hypertension: Secondary | ICD-10-CM | POA: Diagnosis not present

## 2021-10-20 DIAGNOSIS — M25561 Pain in right knee: Secondary | ICD-10-CM | POA: Diagnosis not present

## 2021-10-28 ENCOUNTER — Ambulatory Visit: Payer: Medicare HMO | Attending: Specialist

## 2021-10-28 ENCOUNTER — Other Ambulatory Visit: Payer: Self-pay

## 2021-10-28 DIAGNOSIS — M6281 Muscle weakness (generalized): Secondary | ICD-10-CM | POA: Diagnosis not present

## 2021-10-28 DIAGNOSIS — M25561 Pain in right knee: Secondary | ICD-10-CM | POA: Insufficient documentation

## 2021-10-28 DIAGNOSIS — M25562 Pain in left knee: Secondary | ICD-10-CM | POA: Diagnosis not present

## 2021-10-28 DIAGNOSIS — R2689 Other abnormalities of gait and mobility: Secondary | ICD-10-CM | POA: Insufficient documentation

## 2021-10-28 DIAGNOSIS — R262 Difficulty in walking, not elsewhere classified: Secondary | ICD-10-CM | POA: Insufficient documentation

## 2021-10-28 DIAGNOSIS — M545 Low back pain, unspecified: Secondary | ICD-10-CM | POA: Diagnosis not present

## 2021-10-28 NOTE — Therapy (Signed)
[place] @ [facility] [place], [place], [address] Phone: [phone]   Fax:  [phone]  Physical Therapy Evaluation  Patient Details  Name: Jessica Bautista MRN: [id number] Date of Birth: May 19, 1941 Referring Provider (PT): Dr. Susa Day   Encounter Date: 10/28/2021   PT End of Session - 10/28/21 1708     Visit Number 1    Date for PT Re-Evaluation 12/23/21    Authorization Type [employer]na Medicare    Authorization - Visit Number 1    PT Start Time 5284    PT Stop Time 1324    PT Time Calculation (min) 45 min             Past Medical History:  Diagnosis Date   Atypical nevi    Basal cell carcinoma    Breast cancer ([place])    DVT (deep venous thrombosis) ([place])    Eczema    Family history of colonic polyps    Fatty (change of) liver, not elsewhere classified    Hyperlipidemia    Hypertension    Hypothyroidism    Impaired fasting glucose    Insomnia    Lichen sclerosus et atrophicus of the vulva    Major depression, single episode    Nontoxic single thyroid nodule    Osteopenia    Psoriasis    Pulmonary embolus ([place]) 1999   post op tummy tuc   Skin cancer    Thyroid disease    Vitamin D deficiency, unspecified    Wears glasses     Past Surgical History:  Procedure Laterality Date   ABDOMINOPLASTY  [place]   right   BREAST LUMPECTOMY Right 01/27/2014   malignant   BREAST LUMPECTOMY WITH NEEDLE LOCALIZATION Right 01/27/2014   Procedure: BREAST LUMPECTOMY WITH NEEDLE LOCALIZATION;  Surgeon: Edward Jolly, MD;  Location: [facility];  Service: General;  Laterality: Right;   BREAST SURGERY     COLONOSCOPY     EYE SURGERY Bilateral 2012   TOTAL KNEE ARTHROPLASTY Left 01/06/2021   Procedure: TOTAL KNEE ARTHROPLASTY;  Surgeon: Susa Day, MD;  Location: [facility];  Service: Orthopedics;  Laterality: Left;  2.5 hrs   [place]   right    There were no vitals filed for this visit.    Subjective Assessment - 10/28/21 1527     Subjective Patient complains of right posterior knee pain radiating up into her thigh.  She also has right side low back pain.  She had left TKA in March.  The pain has been present for approx 6 weeks.  She has pain with ascending stairs.  She is retired.  She would like to eliminate pain and be able to resume her prior level of function.    How long can you sit comfortably? unlimited    How long can you stand comfortably? 15 min    How long can you walk comfortably? 15 min    Diagnostic tests xrays reveal some narrowing but no severe OA.    Patient Stated Goals To eliminate pain and be able to resume her prior level of function.    Currently in Pain? Yes    Pain Score 4     Pain Location Knee    Pain Orientation Posterior    Pain Descriptors / Indicators Aching;Dull    Pain Type Acute pain    Pain  Onset More than a month ago    Aggravating Factors  stairs, prolonged standing    Pain Relieving Factors medication    Multiple Pain Sites Yes   Lumbar   Pain Score 0    Pain Location Back   sitting min pain but standing increases to 7/10   Pain Orientation Right    Pain Descriptors / Indicators Aching    Pain Type Acute pain    Pain Onset More than a month ago    Pain Frequency Intermittent    Aggravating Factors  standing                [facility] PT Assessment - 10/28/21 0001       Assessment   Medical Diagnosis Pain in right knee    Referring Provider (PT) Dr. Susa Day    Onset Date/Surgical Date 09/07/21    Hand Dominance Right    Next MD Visit as needed    Prior Therapy none      Precautions   Precautions None      Balance Screen   Has the patient fallen in the past 6 months No    Has the patient had a decrease in activity level because of a fear of falling?  No    Is the patient reluctant to leave their home because of a fear of falling?  No       Home Environment   Living Environment Private residence    Living Arrangements Alone    Type of [place] to enter    Entrance Stairs-Number of Steps 4    Entrance Stairs-Rails Left    [place] One level    [place] None      Prior Function   Level of Independence Independent    Vocation Retired    Leisure Walking      Editor, commissioning Appears Intact      ROM / Strength   AROM / PROM / Strength AROM;Strength      AROM   Overall AROM  Within functional limits for tasks performed    Overall AROM Comments Lumbar and right knee ROM WFL      Strength   Overall Strength Within functional limits for tasks performed    Overall Strength Comments Right LE generally 4/5, Pain on resisted hip flexion in the posterior knee      Palpation   Palpation comment Posterior knee in popliteal area tender and with mild edema: possible fluid pocket      Transfers   Transfers Stand to Sit    Stand to Sit 7: Independent    Transfer Cueing weight shift fwd      Ambulation/Gait   Ambulation/Gait Yes    Gait Pattern Step-through pattern;Right genu recurvatum;Antalgic                        Objective measurements completed on examination: See above findings.                PT Education - 10/28/21 1707     Education Details Access Code: [email or web address]    Person(s) Educated Patient    Methods Explanation;Demonstration;Verbal cues;Handout    Comprehension Verbalized understanding;Returned demonstration;Verbal cues required              PT Short Term Goals - 10/28/21 1607       PT SHORT TERM GOAL #1   Title Pt will  be ind with initial HEP    Time 4    Period Weeks    Status New    Target Date 11/25/21      PT SHORT TERM GOAL #2   Title Patient to report pain no greater than 4/10 in right posterior knee    Time 4    Period Weeks    Status New    Target Date 11/25/21               PT Long Term Goals -  10/28/21 1607       PT LONG TERM GOAL #1   Title Pt will be ind with advanced HEP and understand how to safely progress    Time 8    Period Weeks    Status New    Target Date 12/23/21      PT LONG TERM GOAL #2   Title Patient to be able to ascend and descend stairs with reciprocal gait safely and with no pain.    Time 8    Period Weeks    Status New    Target Date 12/23/21      PT LONG TERM GOAL #3   Title Radicular symptoms to be centralized in right LE    Time 8    Period Weeks    Status New    Target Date 12/23/21      PT LONG TERM GOAL #4   Title Patient to report 80% reduction in symptoms    Time 8    Period Weeks    Status New    Target Date 12/23/21                    Plan - 10/28/21 1702     Clinical Impression Statement Patient is an 80 y.o. female who had recent onset of posterior knee pain and sciatica type symptoms.  She has pain with ascending steps and with prolonged standing.  She presents with mild to mod crepitus bilateral knees, functional ROM in bilateral knees and lumbar spine and generalized weakness. She would benefit from skilled PT for quad rehab, flexibility and pain control.    Personal Factors and Comorbidities Age;Comorbidity 1    Comorbidities recent left TKA    Examination-Activity Limitations Bend;Lift;Squat    Examination-Participation Restrictions Cleaning;Laundry;Community Activity    Stability/Clinical Decision Making Stable/Uncomplicated    Clinical Decision Making Low    Rehab Potential Good    PT Frequency 2x / week    PT Duration 8 weeks    PT Treatment/Interventions ADLs/Self Care Home Management;Aquatic Therapy;Moist Heat;Iontophoresis 4mg /ml Dexamethasone;Electrical Stimulation;Cryotherapy;Ultrasound;DME Instruction;Gait training;Therapeutic exercise;Therapeutic activities;Functional mobility training;Stair training;Balance training;Neuromuscular re-education;Patient/family education;Manual techniques;Passive range of  motion;Vasopneumatic Device;Taping;Dry needling;Energy conservation;Joint Manipulations    PT Next Visit Plan Please complete FOTO!  Begin right LE strengthening and core stabilization.    PT Home Exercise Plan Access Code: [email or web address]  URL: [email or web address]  Date: 10/28/2021  Prepared by: Candyce Churn    Exercises  Gastroc Stretch on Wall - 2 x daily - 7 x weekly - 1 sets - 5 reps - 10 sec hold  Soleus Stretch on Wall - 2 x daily - 7 x weekly - 1 sets - 5 reps - 10 sec hold  Standing Hamstring Stretch on Chair - 2 x daily - 7 x weekly - 1 sets - 3 reps - 30 sec hold  Standing Quad Stretch with Table and Chair Support - 2 x daily - 7 x weekly - 1 sets -  3 reps - 30 sec hold    Consulted and Agree with Plan of Care Patient             Patient will benefit from skilled therapeutic intervention in order to improve the following deficits and impairments:  Abnormal gait, Decreased balance, Decreased endurance, Difficulty walking, Decreased range of motion, Decreased strength, Pain  Visit Diagnosis: Acute pain of right knee - Plan: PT plan of care cert/re-cert  Acute right-sided low back pain without sciatica - Plan: PT plan of care cert/re-cert  Difficulty in walking, not elsewhere classified - Plan: PT plan of care cert/re-cert  Muscle weakness (generalized) - Plan: PT plan of care cert/re-cert     Problem List Patient Active Problem List   Diagnosis Date Noted   Left knee DJD 01/06/2021   Coagulopathy ([place]) 11/30/2020   Pulmonary embolus ([place]) 11/25/2020   DVT (deep venous thrombosis) ([place]) 11/25/2020   Presence of IVC filter 11/25/2020   Vulvar lesion 09/16/2016   Osteopenia 18/59/0931   Lichen sclerosus 10/22/2445   Cystocele 03/23/2015   Malignant neoplasm of upper-outer quadrant of right breast in female, estrogen receptor positive ([place]) 01/10/2014    Okema Rollinson B. Meredith Kilbride, PT 12/22/227:48 PM   [facility] @  [facility] [place], [place], [address] Phone: [phone]   Fax:  [phone]  Name: Jessica Bautista MRN: [id number] Date of Birth: March 06, 1941

## 2021-10-28 NOTE — Patient Instructions (Signed)
Access Code: [email or web address] URL: [email or web address] Date: 10/28/2021 Prepared by: Candyce Churn  Exercises Gastroc Stretch on Wall - 2 x daily - 7 x weekly - 1 sets - 5 reps - 10 sec hold Soleus Stretch on Wall - 2 x daily - 7 x weekly - 1 sets - 5 reps - 10 sec hold Standing Hamstring Stretch on Chair - 2 x daily - 7 x weekly - 1 sets - 3 reps - 30 sec hold Standing Quad Stretch with Table and Chair Support - 2 x daily - 7 x weekly - 1 sets - 3 reps - 30 sec hold

## 2021-11-03 ENCOUNTER — Ambulatory Visit: Payer: Medicare HMO

## 2021-11-03 ENCOUNTER — Other Ambulatory Visit: Payer: Self-pay

## 2021-11-03 DIAGNOSIS — M25562 Pain in left knee: Secondary | ICD-10-CM | POA: Diagnosis not present

## 2021-11-03 DIAGNOSIS — M6281 Muscle weakness (generalized): Secondary | ICD-10-CM

## 2021-11-03 DIAGNOSIS — R262 Difficulty in walking, not elsewhere classified: Secondary | ICD-10-CM

## 2021-11-03 DIAGNOSIS — M545 Low back pain, unspecified: Secondary | ICD-10-CM | POA: Diagnosis not present

## 2021-11-03 DIAGNOSIS — R2689 Other abnormalities of gait and mobility: Secondary | ICD-10-CM

## 2021-11-03 DIAGNOSIS — M25561 Pain in right knee: Secondary | ICD-10-CM

## 2021-11-03 NOTE — Patient Instructions (Signed)
Continue current HEP focusing on stretching.

## 2021-11-03 NOTE — Therapy (Signed)
[facility] @ [place], [place], [address] Phone: [phone]   Fax:  [phone]  Physical Therapy Treatment  Patient Details  Name: Jessica Bautista MRN: [id number] Date of Birth: 09-12-1941 Referring Provider (PT): Dr. Susa Day   Encounter Date: 11/03/2021   PT End of Session - 11/03/21 1226     Visit Number 2    Date for PT Re-Evaluation 12/23/21    Authorization Type A[employer]na Medicare    Authorization - Visit Number 2    PT Start Time 1150    PT Stop Time 1225    PT Time Calculation (min) 35 min    Activity Tolerance Patient tolerated treatment well    Behavior During Therapy [facility] for tasks assessed/performed             Past Medical History:  Diagnosis Date   Atypical nevi    Basal cell carcinoma    Breast cancer ([place])    DVT (deep venous thrombosis) ([place])    Eczema    Family history of colonic polyps    Fatty (change of) liver, not elsewhere classified    Hyperlipidemia    Hypertension    Hypothyroidism    Impaired fasting glucose    Insomnia    Lichen sclerosus et atrophicus of the vulva    Major depression, single episode    Nontoxic single thyroid nodule    Osteopenia    Psoriasis    Pulmonary embolus ([place]) 1999   post op tummy tuc   Skin cancer    Thyroid disease    Vitamin D deficiency, unspecified    Wears glasses     Past Surgical History:  Procedure Laterality Date   ABDOMINOPLASTY  [place]   right   BREAST LUMPECTOMY Right 01/27/2014   malignant   BREAST LUMPECTOMY WITH NEEDLE LOCALIZATION Right 01/27/2014   Procedure: BREAST LUMPECTOMY WITH NEEDLE LOCALIZATION;  Surgeon: Edward Jolly, MD;  Location: [facility];  Service: General;  Laterality: Right;   BREAST SURGERY     COLONOSCOPY     EYE SURGERY Bilateral 2012   TOTAL KNEE ARTHROPLASTY Left 01/06/2021   Procedure: TOTAL KNEE ARTHROPLASTY;  Surgeon: Susa Day,  MD;  [facility];  Service: Orthopedics;  Laterality: Left;  2.5 hrs   [place]   right    There were no vitals filed for this visit.   Subjective Assessment - 11/03/21 1154     Subjective Patient states that after last visit and doing her HEP, she has minimal to no pain in her back and hip and her right posterior knee is minimal.    How long can you sit comfortably? unlimited    How long can you stand comfortably? 15 min    How long can you walk comfortably? 15 min    Diagnostic tests xrays reveal some narrowing but no severe OA.    Patient Stated Goals To eliminate pain and be able to resume her prior level of function.    Currently in Pain? No/denies    Pain Score 0-No pain    Pain Onset More than a month ago    Pain Onset More than a month ago                               [facility]  Adult PT Treatment/Exercise - 11/03/21 0001       Exercises   Exercises Lumbar;Knee/Hip      Lumbar Exercises: Stretches   Piriformis Stretch 5 reps;10 seconds    Piriformis Stretch Limitations right only      Lumbar Exercises: Aerobic   Nustep Level 5 x 5 min seat 8, arms 9      Lumbar Exercises: Supine   Pelvic Tilt 10 reps    Clam 20 reps    Clam Limitations green loop    Dead Bug 20 reps      Knee/Hip Exercises: Stretches   Active Hamstring Stretch Right;3 reps;30 seconds    Active Hamstring Stretch Limitations standing with foot on step    Quad Stretch Right;3 reps;30 seconds    Quad Stretch Limitations standing with right foot in chair behind holding onto steps    Other Knee/Hip Stretches butterfly stretch x 5 hold 10 sec      Knee/Hip Exercises: Machines for Strengthening   Cybex Leg Press 30 lb 2 x 10 right leg only    Hip Cybex Right hip abduction and extension 2 x 10   55 lb                      PT Short Term Goals - 10/28/21 1607       PT SHORT TERM GOAL #1   Title Pt will be ind with initial  HEP    Time 4    Period Weeks    Status New    Target Date 11/25/21      PT SHORT TERM GOAL #2   Title Patient to report pain no greater than 4/10 in right posterior knee    Time 4    Period Weeks    Status New    Target Date 11/25/21               PT Long Term Goals - 10/28/21 1607       PT LONG TERM GOAL #1   Title Pt will be ind with advanced HEP and understand how to safely progress    Time 8    Period Weeks    Status New    Target Date 12/23/21      PT LONG TERM GOAL #2   Title Patient to be able to ascend and descend stairs with reciprocal gait safely and with no pain.    Time 8    Period Weeks    Status New    Target Date 12/23/21      PT LONG TERM GOAL #3   Title Radicular symptoms to be centralized in right LE    Time 8    Period Weeks    Status New    Target Date 12/23/21      PT LONG TERM GOAL #4   Title Patient to report 80% reduction in symptoms    Time 8    Period Weeks    Status New    Target Date 12/23/21                   Plan - 11/03/21 1202     Clinical Impression Statement Jessica Bautista responded very well to initial HEP and session.  She is having minimal pain today and was able to complete all tasks without increased pain.  She is compliant and well motivated.  She should continue to improve with skilled PT for right hip flexibility exercises along with hip stability and  strength training.    Personal Factors and Comorbidities Age;Comorbidity 1    Comorbidities recent left TKA    Examination-Activity Limitations Bend;Lift;Squat    Examination-Participation Restrictions Cleaning;Laundry;Community Activity    Stability/Clinical Decision Making Stable/Uncomplicated    Clinical Decision Making Low    Rehab Potential Good    PT Frequency 2x / week    PT Duration 8 weeks    PT Treatment/Interventions ADLs/Self Care Home Management;Aquatic Therapy;Moist Heat;Iontophoresis 4mg /ml Dexamethasone;Electrical  Stimulation;Cryotherapy;Ultrasound;DME Instruction;Gait training;Therapeutic exercise;Therapeutic activities;Functional mobility training;Stair training;Balance training;Neuromuscular re-education;Patient/family education;Manual techniques;Passive range of motion;Vasopneumatic Device;Taping;Dry needling;Energy conservation;Joint Manipulations    PT Next Visit Plan Please complete FOTO!  Begin right LE strengthening and core stabilization.    PT Home Exercise Plan Access Code: [email or web address]  URL: [email or web address]  Date: 10/28/2021  Prepared by: Candyce Churn    Exercises  Gastroc Stretch on Wall - 2 x daily - 7 x weekly - 1 sets - 5 reps - 10 sec hold  Soleus Stretch on Wall - 2 x daily - 7 x weekly - 1 sets - 5 reps - 10 sec hold  Standing Hamstring Stretch on Chair - 2 x daily - 7 x weekly - 1 sets - 3 reps - 30 sec hold  Standing Quad Stretch with Table and Chair Support - 2 x daily - 7 x weekly - 1 sets - 3 reps - 30 sec hold    Consulted and Agree with Plan of Care Patient             Patient will benefit from skilled therapeutic intervention in order to improve the following deficits and impairments:  Abnormal gait, Decreased balance, Decreased endurance, Difficulty walking, Decreased range of motion, Decreased strength, Pain  Visit Diagnosis: Acute pain of right knee  Acute right-sided low back pain without sciatica  Difficulty in walking, not elsewhere classified  Muscle weakness (generalized)  Acute pain of left knee  Other abnormalities of gait and mobility     Problem List Patient Active Problem List   Diagnosis Date Noted   Left knee DJD 01/06/2021   Coagulopathy ([place]) 11/30/2020   Pulmonary embolus ([place]) 11/25/2020   DVT (deep venous thrombosis) ([place]) 11/25/2020   Presence of IVC filter 11/25/2020   Vulvar lesion 09/16/2016   Osteopenia 34/19/6222   Lichen sclerosus 97/98/9211   Cystocele 03/23/2015   Malignant neoplasm of upper-outer quadrant of  right breast in female, estrogen receptor positive ([place]) 01/10/2014    Ogechi Kuehnel B. Coralynn Gaona, PT 11/04/2211:31 PM   [facility] @ [place], [place], [address] Phone: [phone]   Fax:  [phone]  Name: Jessica Bautista MRN: [id number] Date of Birth: 27-Jan-1941

## 2021-11-10 ENCOUNTER — Ambulatory Visit: Payer: Medicare HMO | Attending: Specialist

## 2021-11-10 ENCOUNTER — Other Ambulatory Visit: Payer: Self-pay

## 2021-11-10 DIAGNOSIS — Z0100 Encounter for examination of eyes and vision without abnormal findings: Secondary | ICD-10-CM | POA: Diagnosis not present

## 2021-11-10 DIAGNOSIS — M545 Low back pain, unspecified: Secondary | ICD-10-CM | POA: Diagnosis not present

## 2021-11-10 DIAGNOSIS — M25561 Pain in right knee: Secondary | ICD-10-CM | POA: Diagnosis not present

## 2021-11-10 DIAGNOSIS — M6281 Muscle weakness (generalized): Secondary | ICD-10-CM | POA: Insufficient documentation

## 2021-11-10 DIAGNOSIS — R262 Difficulty in walking, not elsewhere classified: Secondary | ICD-10-CM | POA: Insufficient documentation

## 2021-11-10 DIAGNOSIS — H524 Presbyopia: Secondary | ICD-10-CM | POA: Diagnosis not present

## 2021-11-10 NOTE — Therapy (Signed)
[facility] @ [address] [place], [place], [address] Phone: [phone]   Fax:  [phone]  Physical Therapy Treatment  Patient Details  Name: Jessica Bautista MRN: [id number] Date of Birth: [birth year] Referring Provider (PT): Dr. Susa Day   Encounter Date: 11/10/2021   PT End of Session - 11/10/21 1327     Visit Number 3    Date for PT Re-Evaluation 12/23/21    Authorization Type Aetna Medicare    Progress Note Due on Visit 10    PT Start Time 1232    PT Stop Time 1319    PT Time Calculation (min) 47 min    Activity Tolerance Patient tolerated treatment well    Behavior During Therapy [facility] for tasks assessed/performed             Past Medical History:  Diagnosis Date   Atypical nevi    Basal cell carcinoma    Breast cancer ([place])    DVT (deep venous thrombosis) ([place])    Eczema    Family history of colonic polyps    Fatty (change of) liver, not elsewhere classified    Hyperlipidemia    Hypertension    Hypothyroidism    Impaired fasting glucose    Insomnia    Lichen sclerosus et atrophicus of the vulva    Major depression, single episode    Nontoxic single thyroid nodule    Osteopenia    Psoriasis    Pulmonary embolus ([place]) 1999   post op tummy tuc   Skin cancer    Thyroid disease    Vitamin D deficiency, unspecified    Wears glasses     Past Surgical History:  Procedure Laterality Date   ABDOMINOPLASTY  [place]   right   BREAST LUMPECTOMY Right 01/27/2014   malignant   BREAST LUMPECTOMY WITH NEEDLE LOCALIZATION Right 01/27/2014   Procedure: BREAST LUMPECTOMY WITH NEEDLE LOCALIZATION;  Surgeon: Edward Jolly, MD;  Location: [facility];  Service: General;  Laterality: Right;   BREAST SURGERY     COLONOSCOPY     EYE SURGERY Bilateral 2012   TOTAL KNEE ARTHROPLASTY Left 01/06/2021   Procedure: TOTAL KNEE ARTHROPLASTY;  Surgeon: Susa Day, MD;   Location[facility];  Service: Orthopedics;  Laterality: Left;  2.5 hrs   [place]   right    There were no vitals filed for this visit.   Subjective Assessment - 11/10/21 1233     Subjective My knee is feeling better.  Pain begain in my low back a couple of days ago.    Currently in Pain? Yes    Pain Score 2     Pain Location Back    Pain Orientation Right    Pain Descriptors / Indicators Burning    Pain Onset More than a month ago    Aggravating Factors  unknown, sometimes sleep    Pain Relieving Factors rolling to a different position.                               [facility] Adult PT Treatment/Exercise - 11/10/21 0001       Lumbar Exercises: Stretches   Single Knee to Chest Stretch 2 reps;20 seconds    Lower Trunk Rotation 3 reps;20 seconds    Piriformis Stretch 2 reps;20 seconds  seated     Lumbar Exercises: Aerobic   Nustep Level 5 x 6 min seat 8, arms 9- PT present to discuss progress      Lumbar Exercises: Supine   Clam 20 reps    Clam Limitations green loop    Other Supine Lumbar Exercises gluteal release with foam roll- PT issued info regarding OPTP foam roll and MELT method      Knee/Hip Exercises: Stretches   Active Hamstring Stretch Right;3 reps;30 seconds    Active Hamstring Stretch Limitations seated      Manual Therapy   Manual Therapy Myofascial release    Manual therapy comments addaday to Rt>Lt lumbar and gluteals                       PT Short Term Goals - 10/28/21 1607       PT SHORT TERM GOAL #1   Title Pt will be ind with initial HEP    Time 4    Period Weeks    Status New    Target Date 11/25/21      PT SHORT TERM GOAL #2   Title Patient to report pain no greater than 4/10 in right posterior knee    Time 4    Period Weeks    Status New    Target Date 11/25/21               PT Long Term Goals - 11/10/21 1239       PT LONG TERM GOAL #1   Title Pt will be  ind with advanced HEP and understand how to safely progress    Status On-going      PT LONG TERM GOAL #3   Title Radicular symptoms to be centralized in right LE    Status Achieved      PT LONG TERM GOAL #4   Title Patient to report 80% reduction in symptoms    Baseline Knee pain is 100% better.  Now with Rt sided LBP    Status On-going                   Plan - 11/10/21 1325     Clinical Impression Statement Pt reports that her Rt knee pain is resolved and now she has Rt sided lumbar pain.  She is having minimal pain today and was able to complete all tasks without increased pain.  Pt required minor tactile and verbal cues for technique with core activation and to reduce Rt knee hyperextension.  PT educated regarding use of foam roll and MELT method for tissue release.  Pt reported less pain after session today.   She should continue to improve with skilled PT for right hip flexibility exercises along with hip stability and strength training.    PT Frequency 2x / week    PT Duration 8 weeks    PT Treatment/Interventions ADLs/Self Care Home Management;Aquatic Therapy;Moist Heat;Iontophoresis 4mg /ml Dexamethasone;Electrical Stimulation;Cryotherapy;Ultrasound;DME Instruction;Gait training;Therapeutic exercise;Therapeutic activities;Functional mobility training;Stair training;Balance training;Neuromuscular re-education;Patient/family education;Manual techniques;Passive range of motion;Vasopneumatic Device;Taping;Dry needling;Energy conservation;Joint Manipulations    PT Next Visit Plan review mor foam roll release for low back, core, hip and knee strength    Consulted and Agree with Plan of Care Patient             Patient will benefit from skilled therapeutic intervention in order to improve the following deficits and impairments:  Abnormal gait, Decreased balance, Decreased endurance, Difficulty walking, Decreased range of motion, Decreased strength,  Pain  Visit  Diagnosis: Acute pain of right knee  Acute right-sided low back pain without sciatica     Problem List Patient Active Problem List   Diagnosis Date Noted   Left knee DJD 01/06/2021   Coagulopathy ([place]) 11/30/2020   Pulmonary embolus ([place]) 11/25/2020   DVT (deep venous thrombosis) ([place]) 11/25/2020   Presence of IVC filter 11/25/2020   Vulvar lesion 09/16/2016   Osteopenia 36/72/5500   Lichen sclerosus 16/42/9037   Cystocele 03/23/2015   Malignant neoplasm of upper-outer quadrant of right breast in female, estrogen receptor positive ([place]) 01/10/2014   Sigurd Sos, PT 11/10/21 1:31 PM   [facility] @ [address] [place], [place], [address] Phone: [phone]   Fax:  [phone]  Name: Jessica Bautista MRN: [id number] Date of Birth: 1941-04-08

## 2021-11-17 ENCOUNTER — Ambulatory Visit: Payer: Medicare HMO

## 2021-11-17 ENCOUNTER — Other Ambulatory Visit: Payer: Self-pay

## 2021-11-17 DIAGNOSIS — R262 Difficulty in walking, not elsewhere classified: Secondary | ICD-10-CM

## 2021-11-17 DIAGNOSIS — M25561 Pain in right knee: Secondary | ICD-10-CM

## 2021-11-17 DIAGNOSIS — M545 Low back pain, unspecified: Secondary | ICD-10-CM | POA: Diagnosis not present

## 2021-11-17 DIAGNOSIS — M6281 Muscle weakness (generalized): Secondary | ICD-10-CM

## 2021-11-17 NOTE — Therapy (Signed)
[facility] @ [address] [place], [place], [address] Phone: [phone]   Fax:  [phone]  Physical Therapy Treatment  Patient Details  Name: Jessica Bautista MRN: [id number] Date of Birth: [birth year] Referring Provider (PT): Dr. Susa Day   Encounter Date: 11/17/2021   PT End of Session - 11/17/21 1313     Visit Number 4    Date for PT Re-Evaluation 12/23/21    Authorization Type Aetna Medicare    Authorization - Visit Number 4    PT Start Time 1230    PT Stop Time 1310    PT Time Calculation (min) 40 min    Activity Tolerance Patient tolerated treatment well    Behavior During Therapy [facility] for tasks assessed/performed             Past Medical History:  Diagnosis Date   Atypical nevi    Basal cell carcinoma    Breast cancer ([place])    DVT (deep venous thrombosis) ([place])    Eczema    Family history of colonic polyps    Fatty (change of) liver, not elsewhere classified    Hyperlipidemia    Hypertension    Hypothyroidism    Impaired fasting glucose    Insomnia    Lichen sclerosus et atrophicus of the vulva    Major depression, single episode    Nontoxic single thyroid nodule    Osteopenia    Psoriasis    Pulmonary embolus ([place]) 1999   post op tummy tuc   Skin cancer    Thyroid disease    Vitamin D deficiency, unspecified    Wears glasses     Past Surgical History:  Procedure Laterality Date   ABDOMINOPLASTY  [place]   right   BREAST LUMPECTOMY Right 01/27/2014   malignant   BREAST LUMPECTOMY WITH NEEDLE LOCALIZATION Right 01/27/2014   Procedure: BREAST LUMPECTOMY WITH NEEDLE LOCALIZATION;  Surgeon: Edward Jolly, MD;  Location: [facility];  Service: General;  Laterality: Right;   BREAST SURGERY     COLONOSCOPY     EYE SURGERY Bilateral 2012   TOTAL KNEE ARTHROPLASTY Left 01/06/2021   Procedure: TOTAL KNEE ARTHROPLASTY;  Surgeon: Susa Day,  MD;  [facility];  Service: Orthopedics;  Laterality: Left;  2.5 hrs   [place]   right    There were no vitals filed for this visit.   Subjective Assessment - 11/17/21 1240     Subjective Patient states she is still doing well with her knee issue.  She had some pain in the low back since last visit that resulted in her gradually developing some neck pain.  She states she did a particular stretch and her neck is fine now but her low back is still around 2/10.    How long can you sit comfortably? unlimited    How long can you stand comfortably? 15 min    How long can you walk comfortably? 15 min    Diagnostic tests xrays reveal some narrowing but no severe OA.    Patient Stated Goals To eliminate pain and be able to resume her prior level of function.    Currently in Pain? Yes    Pain Score 2     Pain Location Back    Pain Orientation Right    Pain Descriptors / Indicators Discomfort    Pain Type  Acute pain    Pain Onset More than a month ago                               [facility] Adult PT Treatment/Exercise - 11/17/21 0001       Lumbar Exercises: Aerobic   Nustep Level 5 x 6 min seat 8, arms 9- PT present to discuss progress      Lumbar Exercises: Supine   Pelvic Tilt 20 reps    Clam 20 reps    Clam Limitations green loop    Dead Bug 20 reps      Knee/Hip Exercises: Stretches   Active Hamstring Stretch Right;3 reps;30 seconds    Active Hamstring Stretch Limitations standing at steps using handrail    Quad Stretch Right;3 reps;30 seconds    Quad Stretch Limitations standing with right foot in chair behind holding onto steps    Piriformis Stretch Both;3 reps;30 seconds    Other Knee/Hip Stretches butterfly stretch x 5 hold 10 sec                     PT Education - 11/17/21 1313     Education Details Access Code: [email or web address]   Added 3 new exercises.    Person(s) Educated Patient    Methods  Explanation;Demonstration;Verbal cues;Handout    Comprehension Verbalized understanding;Returned demonstration;Verbal cues required              PT Short Term Goals - 11/17/21 1319       PT SHORT TERM GOAL #1   Title Pt will be ind with initial HEP    Time 4    Period Weeks    Status Partially Met    Target Date 11/25/21      PT SHORT TERM GOAL #2   Title Patient to report pain no greater than 4/10 in right posterior knee    Time 4    Period Weeks    Status Achieved    Target Date 11/25/21      PT SHORT TERM GOAL #3   Title Pt will be able to stand for light houshold tasks and ambulation for up to 15 min with min pain.    Time 4    Period Weeks    Status Partially Met      PT SHORT TERM GOAL #4   Title Pt will achieve Lt knee A/ROM of at least -10 ext and 105 flexion for improved gait and transfers.    Time 4    Period Weeks               PT Long Term Goals - 11/10/21 1239       PT LONG TERM GOAL #1   Title Pt will be ind with advanced HEP and understand how to safely progress    Status On-going      PT LONG TERM GOAL #3   Title Radicular symptoms to be centralized in right LE    Status Achieved      PT LONG TERM GOAL #4   Title Patient to report 80% reduction in symptoms    Baseline Knee pain is 100% better.  Now with Rt sided LBP    Status On-going                   Plan - 11/17/21 1314     Clinical Impression Statement Charidy is responding well to LE stretching.  Knee pain appears to have resolved.  Symptoms in right low back appear to be disc related.  She was able to demonstrate good lower abdominal control with pelvic tilts.  She is motivated and very compliant with HEP.  She would benefit from continued skilled PT for core strength and LE flexibility.    Personal Factors and Comorbidities Age;Comorbidity 1    Comorbidities recent left TKA    Examination-Activity Limitations Bend;Lift;Squat    Examination-Participation Restrictions  Cleaning;Laundry;Community Activity    Stability/Clinical Decision Making Stable/Uncomplicated    Clinical Decision Making Low    Rehab Potential Good    PT Frequency 2x / week    PT Duration 8 weeks    PT Treatment/Interventions ADLs/Self Care Home Management;Aquatic Therapy;Moist Heat;Iontophoresis 40m/ml Dexamethasone;Electrical Stimulation;Cryotherapy;Ultrasound;DME Instruction;Gait training;Therapeutic exercise;Therapeutic activities;Functional mobility training;Stair training;Balance training;Neuromuscular re-education;Patient/family education;Manual techniques;Passive range of motion;Vasopneumatic Device;Taping;Dry needling;Energy conservation;Joint Manipulations    PT Next Visit Plan core, hip and knee strength    PT Home Exercise Plan Access Code: [email or web address] URL: [email or web address]  Date: 11/17/2021  Prepared by: JCandyce Churn   Exercises  Gastroc Stretch on Wall - 2 x daily - 7 x weekly - 1 sets - 5 reps - 10 sec hold  Soleus Stretch on Wall - 2 x daily - 7 x weekly - 1 sets - 5 reps - 10 sec hold  Standing Hamstring Stretch on Chair - 2 x daily - 7 x weekly - 1 sets - 3 reps - 30 sec hold  Standing Quad Stretch with Table and Chair Support - 2 x daily - 7 x weekly - 1 sets - 3 reps - 30 sec hold  Seated Piriformis Stretch with Trunk Bend - 2 x daily - 7 x weekly - 1 sets - 3 reps - 10 sec hold  Butterfly Groin Stretch - 2 x daily - 7 x weekly - 1 sets - 10 reps - 10 sec hold  Supine Dead Bug with Leg Extension - 2 x daily - 7 x weekly - 1 sets - 20 reps    Consulted and Agree with Plan of Care Patient             Patient will benefit from skilled therapeutic intervention in order to improve the following deficits and impairments:  Abnormal gait, Decreased balance, Decreased endurance, Difficulty walking, Decreased range of motion, Decreased strength, Pain  Visit Diagnosis: Acute pain of right knee  Acute right-sided low back pain without sciatica  Difficulty in  walking, not elsewhere classified  Muscle weakness (generalized)     Problem List Patient Active Problem List   Diagnosis Date Noted   Left knee DJD 01/06/2021   Coagulopathy ([place] 11/30/2020   [place] embolus ([place] 11/25/2020   DVT (deep venous thrombosis) ([place] 11/25/2020   Presence of IVC filter 11/25/2020   Vulvar lesion 09/16/2016   Osteopenia 054/07/8118  Lichen sclerosus 014/78/2956  Cystocele 03/23/2015   Malignant neoplasm of upper-outer quadrant of right breast in female, estrogen receptor positive ([place] 01/10/2014    [place] B. Laityn Bensen, PT 01/11/231:21 PM   [facility] [place] [id number]: [phone]  Fax:  [phone] Name: JTRUDY KORYMRN: [id number] of Birth: 128-Aug-1942

## 2021-11-17 NOTE — Patient Instructions (Signed)
Added 2 new hip stretches and dying bug.  Access Code: [email or web address] URL: [email or web address] Date: 11/17/2021 Prepared by: Candyce Churn  Exercises Gastroc Stretch on Wall - 2 x daily - 7 x weekly - 1 sets - 5 reps - 10 sec hold Soleus Stretch on Wall - 2 x daily - 7 x weekly - 1 sets - 5 reps - 10 sec hold Standing Hamstring Stretch on Chair - 2 x daily - 7 x weekly - 1 sets - 3 reps - 30 sec hold Standing Quad Stretch with Table and Chair Support - 2 x daily - 7 x weekly - 1 sets - 3 reps - 30 sec hold Seated Piriformis Stretch with Trunk Bend - 2 x daily - 7 x weekly - 1 sets - 3 reps - 10 sec hold Butterfly Groin Stretch - 2 x daily - 7 x weekly - 1 sets - 10 reps - 10 sec hold Supine Dead Bug with Leg Extension - 2 x daily - 7 x weekly - 1 sets - 20 reps

## 2021-11-22 ENCOUNTER — Other Ambulatory Visit: Payer: Self-pay

## 2021-11-22 ENCOUNTER — Ambulatory Visit: Payer: Medicare HMO | Admitting: Physical Therapy

## 2021-11-22 DIAGNOSIS — R262 Difficulty in walking, not elsewhere classified: Secondary | ICD-10-CM | POA: Diagnosis not present

## 2021-11-22 DIAGNOSIS — M25561 Pain in right knee: Secondary | ICD-10-CM | POA: Diagnosis not present

## 2021-11-22 DIAGNOSIS — M545 Low back pain, unspecified: Secondary | ICD-10-CM | POA: Diagnosis not present

## 2021-11-22 DIAGNOSIS — M6281 Muscle weakness (generalized): Secondary | ICD-10-CM | POA: Diagnosis not present

## 2021-11-22 NOTE — Therapy (Signed)
[facility] @ [address] [place], [place], [address] Phone: [phone]   Fax:  [phone]  Physical Therapy Treatment  Patient Details  Name: Jessica Bautista MRN: [id number] Date of Birth: 12-27-1940 Referring Provider (PT): Dr. Susa Day   Encounter Date: 11/22/2021   PT End of Session - 11/22/21 1231     Visit Number 5    Date for PT Re-Evaluation 12/23/21    Authorization Type Aetna Medicare    Authorization - Visit Number 5    PT Start Time 3267    PT Stop Time 1304    PT Time Calculation (min) 34 min    Activity Tolerance Patient tolerated treatment well    Behavior During Therapy --             Past Medical History:  Diagnosis Date   Atypical nevi    Basal cell carcinoma    Breast cancer ([place])    DVT (deep venous thrombosis) ([place])    Eczema    Family history of colonic polyps    Fatty (change of) liver, not elsewhere classified    Hyperlipidemia    Hypertension    Hypothyroidism    Impaired fasting glucose    Insomnia    Lichen sclerosus et atrophicus of the vulva    Major depression, single episode    Nontoxic single thyroid nodule    Osteopenia    Psoriasis    Pulmonary embolus ([place]) 1999   post op tummy tuc   Skin cancer    Thyroid disease    Vitamin D deficiency, unspecified    Wears glasses     Past Surgical History:  Procedure Laterality Date   ABDOMINOPLASTY  [place]   right   BREAST LUMPECTOMY Right 01/27/2014   malignant   BREAST LUMPECTOMY WITH NEEDLE LOCALIZATION Right 01/27/2014   Procedure: BREAST LUMPECTOMY WITH NEEDLE LOCALIZATION;  Surgeon: Edward Jolly, MD;  Location: [facility];  Service: General;  Laterality: Right;   BREAST SURGERY     COLONOSCOPY     EYE SURGERY Bilateral 2012   TOTAL KNEE ARTHROPLASTY Left 01/06/2021   Procedure: TOTAL KNEE ARTHROPLASTY;  Surgeon: Susa Day, MD;  [facility];   Service: Orthopedics;  Laterality: Left;  2.5 hrs   [place]   right    There were no vitals filed for this visit.   Subjective Assessment - 11/22/21 1234     Subjective Today will be my last day because I am doing so well.                [facility] PT Assessment - 11/22/21 0001       Assessment   Medical Diagnosis Pain in right knee    Referring Provider (PT) Dr. Susa Day    Onset Date/Surgical Date 09/07/21    Hand Dominance Right      AROM   Overall AROM  Within functional limits for tasks performed    Overall AROM Comments Lumbar and right knee ROM WFL      Strength   Overall Strength Comments Quads RT: 4+/5   Hams RT: 5/5                           [facility] Adult PT Treatment/Exercise - 11/22/21 0001       Lumbar Exercises: Aerobic  Nustep Level 5 x 7 min seat 8, arms 9- PTA present to discuss progress      Lumbar Exercises: Supine   Clam 20 reps    Clam Limitations blue band: given to take home for HEP      Knee/Hip Exercises: Stretches   Active Hamstring Stretch Right;3 reps;30 seconds    Active Hamstring Stretch Limitations standing at steps using handrail    Quad Stretch Right;3 reps;30 seconds    Quad Stretch Limitations standing with right foot in chair behind holding onto steps    Piriformis Stretch Both;3 reps;30 seconds    Other Knee/Hip Stretches butterfly stretch x 2 hold 20 sec                       PT Short Term Goals - 11/22/21 1234       PT SHORT TERM GOAL #1   Title Pt will be ind with initial HEP    Time 4    Period Weeks    Status Achieved    Target Date 11/25/21      PT SHORT TERM GOAL #2   Title Patient to report pain no greater than 4/10 in right posterior knee    Time 4    Period Weeks    Status Achieved    Target Date 11/25/21      PT SHORT TERM GOAL #3   Title Pt will be able to stand for light houshold tasks and ambulation for up to 15 min with min  pain.    Time 4    Period Weeks    Status Achieved    Target Date 02/08/21      PT SHORT TERM GOAL #4   Title Pt will achieve Lt knee A/ROM of at least -10 ext and 105 flexion for improved gait and transfers.    Time 4    Period Weeks    Status Achieved    Target Date 02/08/21               PT Long Term Goals - 11/22/21 1235       PT LONG TERM GOAL #1   Title Pt will be ind with advanced HEP and understand how to safely progress    Time 8    Period Weeks    Status Achieved    Target Date 12/23/21      PT LONG TERM GOAL #2   Title Patient to be able to ascend and descend stairs with reciprocal gait safely and with no pain.    Time 8    Period Weeks    Status Achieved    Target Date 12/23/21      PT LONG TERM GOAL #3   Title Radicular symptoms to be centralized in right LE    Time 8    Period Weeks    Status Achieved    Target Date 12/23/21      PT LONG TERM GOAL #4   Title Patient to report 80% reduction in symptoms    Baseline Knee pain is 100% better.  Now with Rt sided LBP    Time 8    Period Weeks    Status Achieved    Target Date 12/23/21      PT LONG TERM GOAL #5   Title Pt will achieve at least 120 flexion of Lt knee for improved ability to squat.    Time 8    Period Weeks    Status Achieved  Target Date 03/08/21                   Plan - 11/22/21 1252     Clinical Impression Statement Pt arrives stating today would be her last PT visit. Pt is very happy with her progress regarding both her knee and her back. She will continue to perform her stretches and continue with her water exercise classes. All long term goal are met. ROm and strength both improved and are within functional limits.    Personal Factors and Comorbidities Age;Comorbidity 1    Comorbidities recent left TKA    Examination-Activity Limitations Bend;Lift;Squat    Examination-Participation Restrictions Cleaning;Laundry;Community Activity    Stability/Clinical Decision  Making Stable/Uncomplicated    Rehab Potential Good    PT Frequency 2x / week    PT Duration 8 weeks    PT Treatment/Interventions ADLs/Self Care Home Management;Aquatic Therapy;Moist Heat;Iontophoresis 4mg /ml Dexamethasone;Electrical Stimulation;Cryotherapy;Ultrasound;DME Instruction;Gait training;Therapeutic exercise;Therapeutic activities;Functional mobility training;Stair training;Balance training;Neuromuscular re-education;Patient/family education;Manual techniques;Passive range of motion;Vasopneumatic Device;Taping;Dry needling;Energy conservation;Joint Manipulations    PT Next Visit Plan DC from PT secondary to pt being pleased with her functional status    PT Home Exercise Plan Access Code: [email or web address]  URL: [email or web address]  Date: 11/17/2021  Prepared by: Candyce Churn    Exercises  Gastroc Stretch on Wall - 2 x daily - 7 x weekly - 1 sets - 5 reps - 10 sec hold  Soleus Stretch on Wall - 2 x daily - 7 x weekly - 1 sets - 5 reps - 10 sec hold  Standing Hamstring Stretch on Chair - 2 x daily - 7 x weekly - 1 sets - 3 reps - 30 sec hold  Standing Quad Stretch with Table and Chair Support - 2 x daily - 7 x weekly - 1 sets - 3 reps - 30 sec hold  Seated Piriformis Stretch with Trunk Bend - 2 x daily - 7 x weekly - 1 sets - 3 reps - 10 sec hold  Butterfly Groin Stretch - 2 x daily - 7 x weekly - 1 sets - 10 reps - 10 sec hold  Supine Dead Bug with Leg Extension - 2 x daily - 7 x weekly - 1 sets - 20 reps    Consulted and Agree with Plan of Care Patient             Patient will benefit from skilled therapeutic intervention in order to improve the following deficits and impairments:  Abnormal gait, Decreased balance, Decreased endurance, Difficulty walking, Decreased range of motion, Decreased strength, Pain  Visit Diagnosis: Acute pain of right knee  Acute right-sided low back pain without sciatica     ProblemJennifer Iyonna Rish, PTA 11/22/21 1:06 PM  Patient Active  Problem List   Diagnosis Date Noted   Left knee DJD 01/06/2021   Coagulopathy ([place]) 11/30/2020   Pulmonary embolus ([place]) 11/25/2020   DVT (deep venous thrombosis) ([place]) 11/25/2020   Presence of IVC filter 11/25/2020   Vulvar lesion 09/16/2016   Osteopenia 01/60/1093   Lichen sclerosus 23/55/7322   Cystocele 03/23/2015   Malignant neoplasm of upper-outer quadrant of right breast in female, estrogen receptor positive ([place]) 01/10/2014    Myrene Galas, PTA 11/22/21 1:06 PM  11/22/2021, 1:06 PM  [facility] @ [address] [place], [place], [address] Phone: [phone]   Fax:  [phone]  Name: Jessica Bautista MRN: [id number] Date of Birth: [birth year]

## 2021-11-29 ENCOUNTER — Encounter: Payer: Medicare HMO | Admitting: Physical Therapy

## 2021-12-06 ENCOUNTER — Encounter: Payer: Medicare HMO | Admitting: Physical Therapy

## 2021-12-13 ENCOUNTER — Encounter: Payer: Medicare HMO | Admitting: Physical Therapy

## 2022-04-06 ENCOUNTER — Other Ambulatory Visit: Payer: Self-pay | Admitting: Internal Medicine

## 2022-04-06 DIAGNOSIS — Z1231 Encounter for screening mammogram for malignant neoplasm of breast: Secondary | ICD-10-CM

## 2022-04-14 DIAGNOSIS — L900 Lichen sclerosus et atrophicus: Secondary | ICD-10-CM | POA: Diagnosis not present

## 2022-04-14 DIAGNOSIS — Z9189 Other specified personal risk factors, not elsewhere classified: Secondary | ICD-10-CM | POA: Diagnosis not present

## 2022-04-14 DIAGNOSIS — Z853 Personal history of malignant neoplasm of breast: Secondary | ICD-10-CM | POA: Diagnosis not present

## 2022-04-14 DIAGNOSIS — B372 Candidiasis of skin and nail: Secondary | ICD-10-CM | POA: Diagnosis not present

## 2022-04-27 ENCOUNTER — Ambulatory Visit
Admission: RE | Admit: 2022-04-27 | Discharge: 2022-04-27 | Disposition: A | Discharge status: Home / Self Care | Payer: Medicare HMO | Source: Ambulatory Visit | Attending: Internal Medicine | Admitting: Internal Medicine

## 2022-04-27 DIAGNOSIS — Z1231 Encounter for screening mammogram for malignant neoplasm of breast: Secondary | ICD-10-CM | POA: Diagnosis not present

## 2022-05-02 DIAGNOSIS — Z853 Personal history of malignant neoplasm of breast: Secondary | ICD-10-CM | POA: Diagnosis not present

## 2022-05-02 DIAGNOSIS — R5382 Chronic fatigue, unspecified: Secondary | ICD-10-CM | POA: Diagnosis not present

## 2022-05-02 DIAGNOSIS — G4700 Insomnia, unspecified: Secondary | ICD-10-CM | POA: Diagnosis not present

## 2022-05-02 DIAGNOSIS — Z8639 Personal history of other endocrine, nutritional and metabolic disease: Secondary | ICD-10-CM | POA: Diagnosis not present

## 2022-05-02 DIAGNOSIS — E039 Hypothyroidism, unspecified: Secondary | ICD-10-CM | POA: Diagnosis not present

## 2022-05-02 DIAGNOSIS — R69 Illness, unspecified: Secondary | ICD-10-CM | POA: Diagnosis not present

## 2022-05-02 DIAGNOSIS — E7800 Pure hypercholesterolemia, unspecified: Secondary | ICD-10-CM | POA: Diagnosis not present

## 2022-05-02 DIAGNOSIS — M85852 Other specified disorders of bone density and structure, left thigh: Secondary | ICD-10-CM | POA: Diagnosis not present

## 2022-05-04 ENCOUNTER — Other Ambulatory Visit: Payer: Self-pay | Admitting: Internal Medicine

## 2022-05-04 DIAGNOSIS — M85852 Other specified disorders of bone density and structure, left thigh: Secondary | ICD-10-CM

## 2022-05-11 DIAGNOSIS — Z8639 Personal history of other endocrine, nutritional and metabolic disease: Secondary | ICD-10-CM | POA: Diagnosis not present

## 2022-05-11 DIAGNOSIS — E041 Nontoxic single thyroid nodule: Secondary | ICD-10-CM | POA: Diagnosis not present

## 2022-06-15 DIAGNOSIS — E039 Hypothyroidism, unspecified: Secondary | ICD-10-CM | POA: Diagnosis not present

## 2022-06-15 DIAGNOSIS — Z6830 Body mass index (BMI) 30.0-30.9, adult: Secondary | ICD-10-CM | POA: Diagnosis not present

## 2022-06-15 DIAGNOSIS — Z008 Encounter for other general examination: Secondary | ICD-10-CM | POA: Diagnosis not present

## 2022-06-15 DIAGNOSIS — E669 Obesity, unspecified: Secondary | ICD-10-CM | POA: Diagnosis not present

## 2022-06-15 DIAGNOSIS — Z853 Personal history of malignant neoplasm of breast: Secondary | ICD-10-CM | POA: Diagnosis not present

## 2022-06-15 DIAGNOSIS — I739 Peripheral vascular disease, unspecified: Secondary | ICD-10-CM | POA: Diagnosis not present

## 2022-06-15 DIAGNOSIS — R030 Elevated blood-pressure reading, without diagnosis of hypertension: Secondary | ICD-10-CM | POA: Diagnosis not present

## 2022-06-15 DIAGNOSIS — R69 Illness, unspecified: Secondary | ICD-10-CM | POA: Diagnosis not present

## 2022-06-15 DIAGNOSIS — N951 Menopausal and female climacteric states: Secondary | ICD-10-CM | POA: Diagnosis not present

## 2022-06-15 DIAGNOSIS — E785 Hyperlipidemia, unspecified: Secondary | ICD-10-CM | POA: Diagnosis not present

## 2022-06-15 DIAGNOSIS — M1990 Unspecified osteoarthritis, unspecified site: Secondary | ICD-10-CM | POA: Diagnosis not present

## 2022-06-15 DIAGNOSIS — L400 Psoriasis vulgaris: Secondary | ICD-10-CM | POA: Diagnosis not present

## 2022-06-15 DIAGNOSIS — R32 Unspecified urinary incontinence: Secondary | ICD-10-CM | POA: Diagnosis not present

## 2022-06-22 DIAGNOSIS — E039 Hypothyroidism, unspecified: Secondary | ICD-10-CM | POA: Diagnosis not present

## 2022-07-07 DIAGNOSIS — L03116 Cellulitis of left lower limb: Secondary | ICD-10-CM | POA: Diagnosis not present

## 2022-07-14 DIAGNOSIS — Z23 Encounter for immunization: Secondary | ICD-10-CM | POA: Diagnosis not present

## 2022-07-14 DIAGNOSIS — L03116 Cellulitis of left lower limb: Secondary | ICD-10-CM | POA: Diagnosis not present

## 2022-08-09 DIAGNOSIS — L720 Epidermal cyst: Secondary | ICD-10-CM | POA: Diagnosis not present

## 2022-08-09 DIAGNOSIS — D225 Melanocytic nevi of trunk: Secondary | ICD-10-CM | POA: Diagnosis not present

## 2022-08-09 DIAGNOSIS — L821 Other seborrheic keratosis: Secondary | ICD-10-CM | POA: Diagnosis not present

## 2022-08-09 DIAGNOSIS — D2272 Melanocytic nevi of left lower limb, including hip: Secondary | ICD-10-CM | POA: Diagnosis not present

## 2022-08-09 DIAGNOSIS — L400 Psoriasis vulgaris: Secondary | ICD-10-CM | POA: Diagnosis not present

## 2022-08-09 DIAGNOSIS — D485 Neoplasm of uncertain behavior of skin: Secondary | ICD-10-CM | POA: Diagnosis not present

## 2022-08-09 DIAGNOSIS — D2261 Melanocytic nevi of right upper limb, including shoulder: Secondary | ICD-10-CM | POA: Diagnosis not present

## 2022-08-09 DIAGNOSIS — D2222 Melanocytic nevi of left ear and external auricular canal: Secondary | ICD-10-CM | POA: Diagnosis not present

## 2022-08-09 DIAGNOSIS — L570 Actinic keratosis: Secondary | ICD-10-CM | POA: Diagnosis not present

## 2022-08-09 DIAGNOSIS — Z85828 Personal history of other malignant neoplasm of skin: Secondary | ICD-10-CM | POA: Diagnosis not present

## 2022-08-09 DIAGNOSIS — L820 Inflamed seborrheic keratosis: Secondary | ICD-10-CM | POA: Diagnosis not present

## 2022-08-09 DIAGNOSIS — D692 Other nonthrombocytopenic purpura: Secondary | ICD-10-CM | POA: Diagnosis not present

## 2022-11-10 ENCOUNTER — Ambulatory Visit
Admission: RE | Admit: 2022-11-10 | Discharge: 2022-11-10 | Disposition: A | Discharge status: Home / Self Care | Payer: Medicare HMO | Source: Ambulatory Visit | Attending: Internal Medicine | Admitting: Internal Medicine

## 2022-11-10 DIAGNOSIS — H40012 Open angle with borderline findings, low risk, left eye: Secondary | ICD-10-CM | POA: Diagnosis not present

## 2022-11-10 DIAGNOSIS — M85851 Other specified disorders of bone density and structure, right thigh: Secondary | ICD-10-CM | POA: Diagnosis not present

## 2022-11-10 DIAGNOSIS — Z961 Presence of intraocular lens: Secondary | ICD-10-CM | POA: Diagnosis not present

## 2022-11-10 DIAGNOSIS — M85852 Other specified disorders of bone density and structure, left thigh: Secondary | ICD-10-CM

## 2022-11-10 DIAGNOSIS — H43813 Vitreous degeneration, bilateral: Secondary | ICD-10-CM | POA: Diagnosis not present

## 2022-11-10 DIAGNOSIS — Z780 Asymptomatic menopausal state: Secondary | ICD-10-CM | POA: Diagnosis not present

## 2022-11-14 DIAGNOSIS — M1990 Unspecified osteoarthritis, unspecified site: Secondary | ICD-10-CM | POA: Diagnosis not present

## 2022-11-14 DIAGNOSIS — Z853 Personal history of malignant neoplasm of breast: Secondary | ICD-10-CM | POA: Diagnosis not present

## 2022-11-14 DIAGNOSIS — R030 Elevated blood-pressure reading, without diagnosis of hypertension: Secondary | ICD-10-CM | POA: Diagnosis not present

## 2022-11-14 DIAGNOSIS — I739 Peripheral vascular disease, unspecified: Secondary | ICD-10-CM | POA: Diagnosis not present

## 2022-11-14 DIAGNOSIS — R69 Illness, unspecified: Secondary | ICD-10-CM | POA: Diagnosis not present

## 2022-11-14 DIAGNOSIS — R32 Unspecified urinary incontinence: Secondary | ICD-10-CM | POA: Diagnosis not present

## 2022-11-14 DIAGNOSIS — L309 Dermatitis, unspecified: Secondary | ICD-10-CM | POA: Diagnosis not present

## 2022-11-14 DIAGNOSIS — E669 Obesity, unspecified: Secondary | ICD-10-CM | POA: Diagnosis not present

## 2022-11-14 DIAGNOSIS — Z6830 Body mass index (BMI) 30.0-30.9, adult: Secondary | ICD-10-CM | POA: Diagnosis not present

## 2022-11-14 DIAGNOSIS — Z008 Encounter for other general examination: Secondary | ICD-10-CM | POA: Diagnosis not present

## 2022-11-14 DIAGNOSIS — E785 Hyperlipidemia, unspecified: Secondary | ICD-10-CM | POA: Diagnosis not present

## 2022-11-14 DIAGNOSIS — L400 Psoriasis vulgaris: Secondary | ICD-10-CM | POA: Diagnosis not present

## 2022-11-14 DIAGNOSIS — Z87891 Personal history of nicotine dependence: Secondary | ICD-10-CM | POA: Diagnosis not present

## 2022-11-16 DIAGNOSIS — M85852 Other specified disorders of bone density and structure, left thigh: Secondary | ICD-10-CM | POA: Diagnosis not present

## 2022-11-16 DIAGNOSIS — Z8639 Personal history of other endocrine, nutritional and metabolic disease: Secondary | ICD-10-CM | POA: Diagnosis not present

## 2022-11-16 DIAGNOSIS — N3281 Overactive bladder: Secondary | ICD-10-CM | POA: Diagnosis not present

## 2022-11-16 DIAGNOSIS — Z79899 Other long term (current) drug therapy: Secondary | ICD-10-CM | POA: Diagnosis not present

## 2022-11-16 DIAGNOSIS — Z853 Personal history of malignant neoplasm of breast: Secondary | ICD-10-CM | POA: Diagnosis not present

## 2022-11-16 DIAGNOSIS — E7800 Pure hypercholesterolemia, unspecified: Secondary | ICD-10-CM | POA: Diagnosis not present

## 2022-11-16 DIAGNOSIS — R69 Illness, unspecified: Secondary | ICD-10-CM | POA: Diagnosis not present

## 2022-11-16 DIAGNOSIS — Z0000 Encounter for general adult medical examination without abnormal findings: Secondary | ICD-10-CM | POA: Diagnosis not present

## 2022-11-16 DIAGNOSIS — E039 Hypothyroidism, unspecified: Secondary | ICD-10-CM | POA: Diagnosis not present

## 2022-11-16 DIAGNOSIS — G4700 Insomnia, unspecified: Secondary | ICD-10-CM | POA: Diagnosis not present

## 2022-12-02 DIAGNOSIS — M85851 Other specified disorders of bone density and structure, right thigh: Secondary | ICD-10-CM | POA: Diagnosis not present

## 2023-01-09 IMAGING — MG DIGITAL DIAGNOSTIC BILAT W/ TOMO W/ CAD
6 of 10 series · 6 of 30 positions shown · non-contrast
Comparison: Previous exam(s).

CLINICAL DATA: 79-year-old female presenting with a palpable area
of concern felt by the patient's physician along the left
inframammary fold. There is also an area of skin breakdown in this
region.

EXAM:
DIGITAL DIAGNOSTIC BILATERAL MAMMOGRAM WITH TOMOSYNTHESIS AND CAD;
ULTRASOUND LEFT BREAST LIMITED
TECHNIQUE: Bilateral digital diagnostic mammography and breast tomosynthesis
was performed. The images were evaluated with computer-aided
detection.; Targeted ultrasound examination of the left breast was
performed

[L CC synth-2D (1 of 2)]
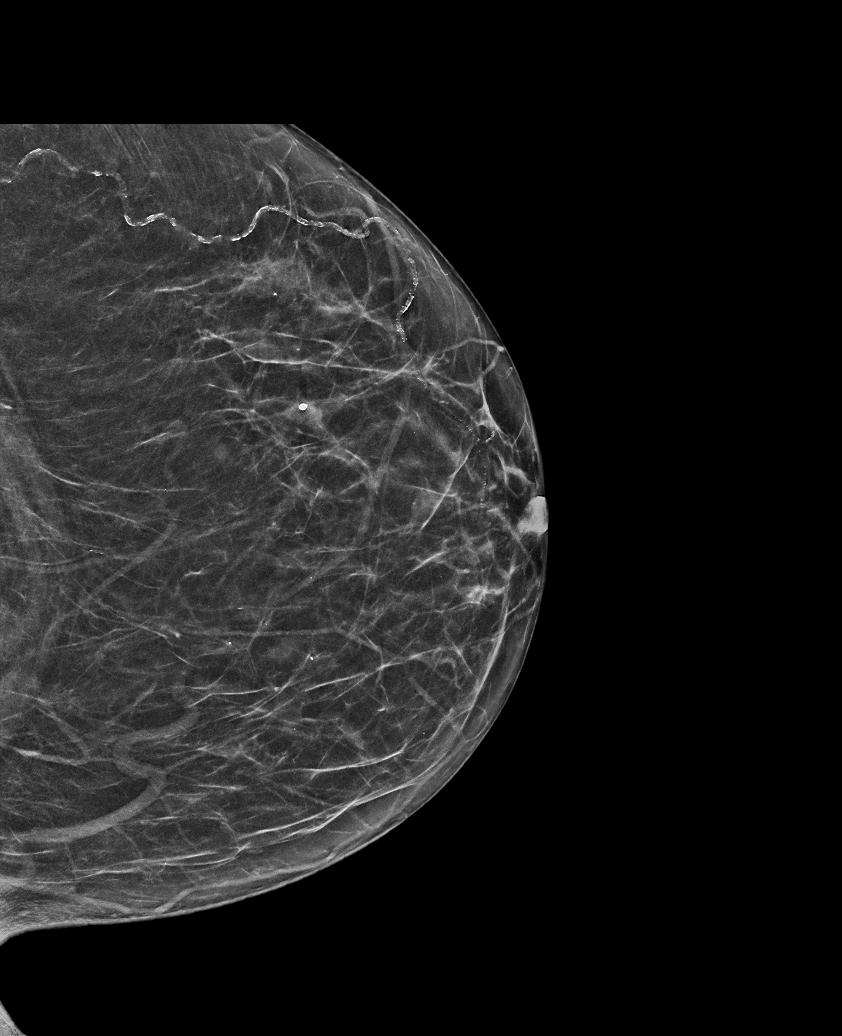

[R MLO synth-2D]
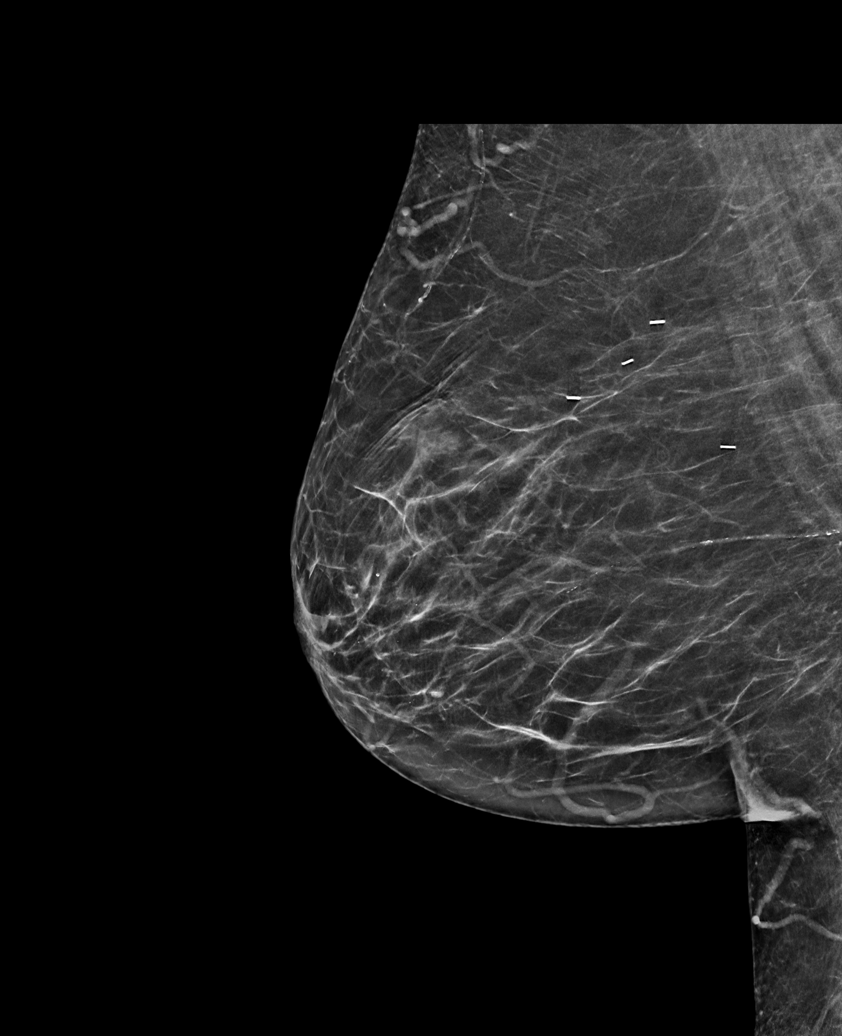

[L MLO synth-2D]
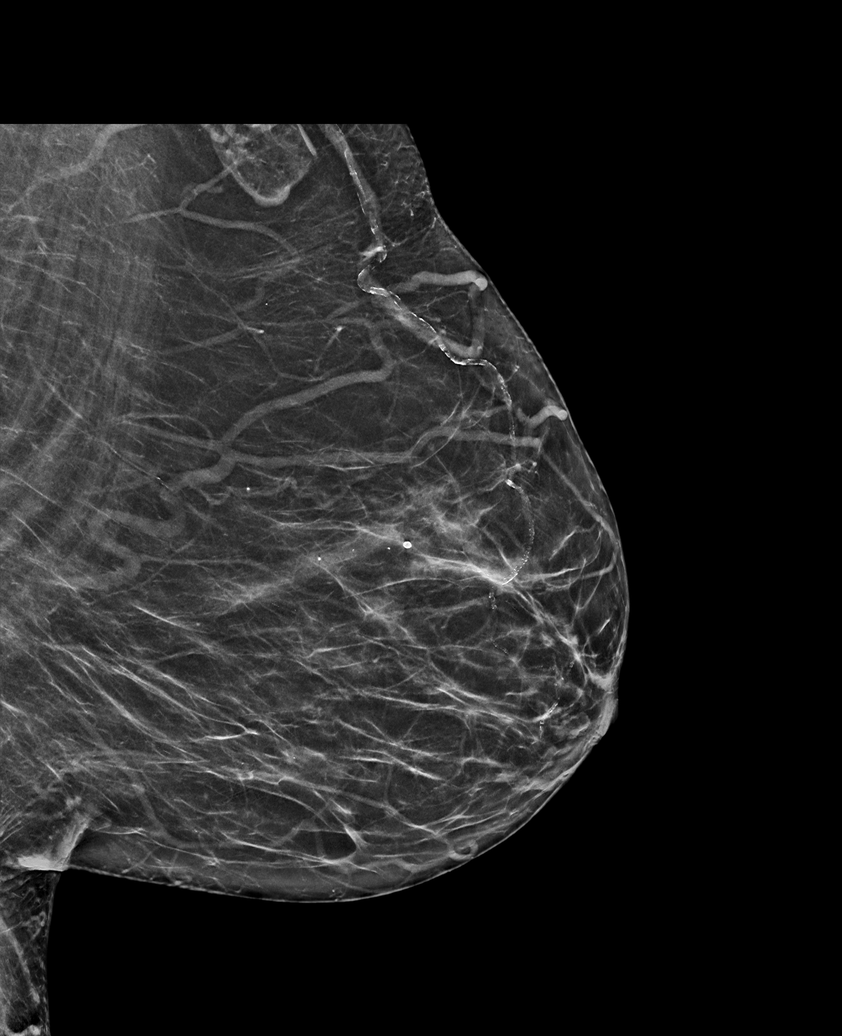

[L CC synth-2D (2 of 2)]
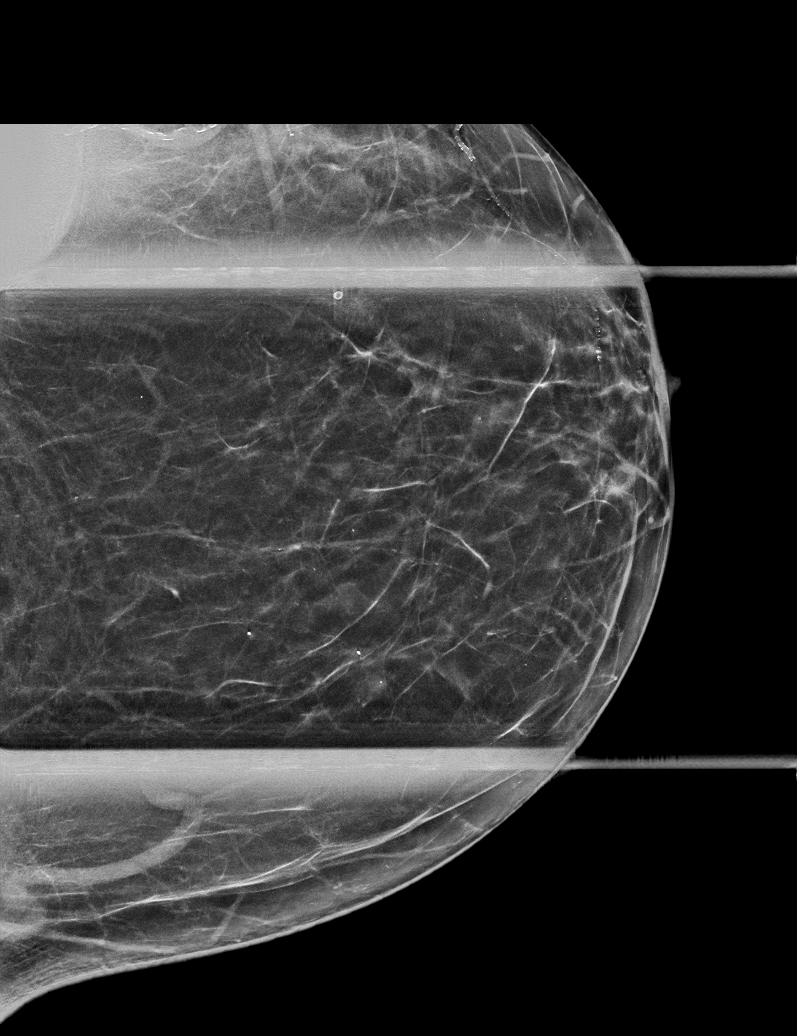

[R CC synth-2D]
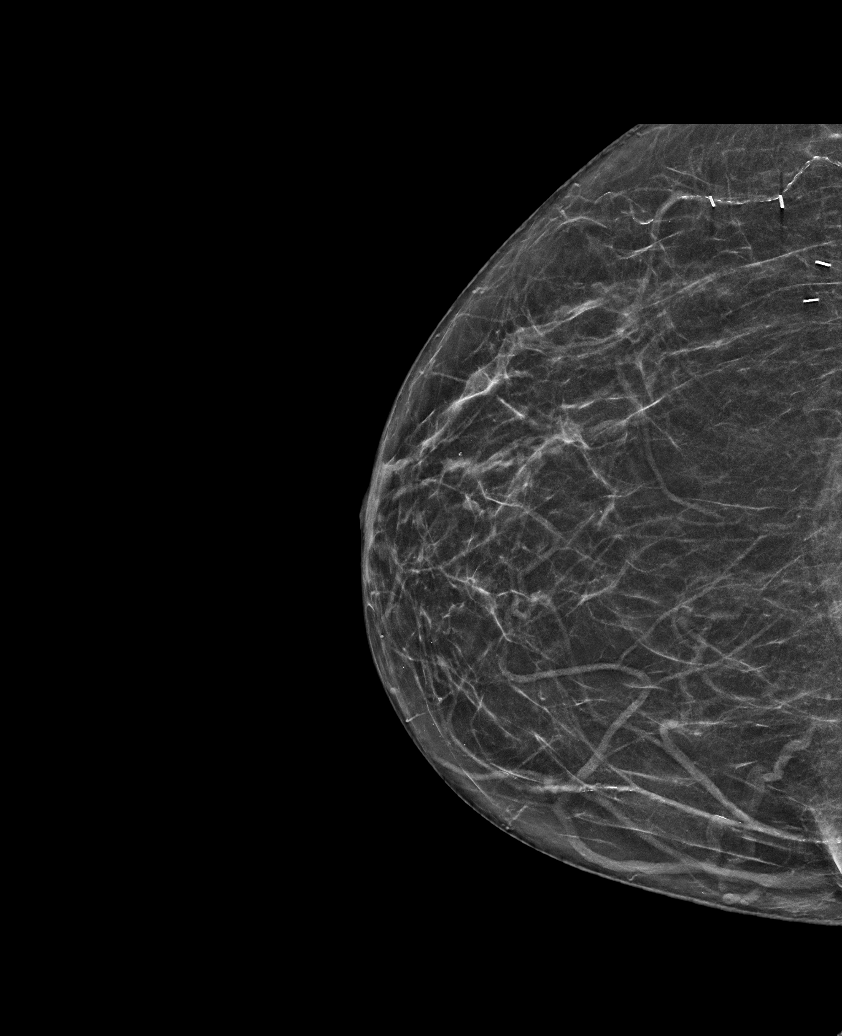

[L MLO tomo · tomo slice 35/70.0]
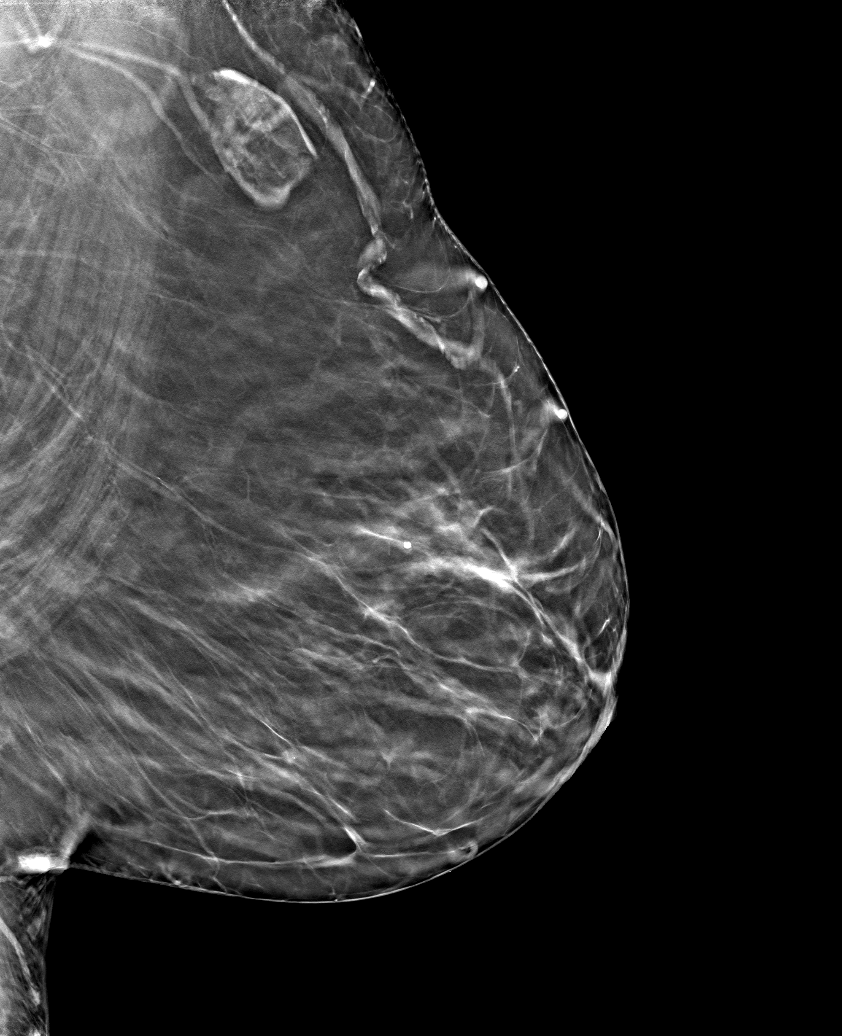

[6 of 30 positions shown; findings below may reference images not displayed]

ACR Breast Density Category b: There are scattered areas of
fibroglandular density.
FINDINGS: Mammogram:

Right breast: No suspicious mass, distortion, or microcalcifications
are identified to suggest presence of malignancy.

Left breast: A spot tangential view of the central left breast was
performed in addition to standard views. There is no new abnormality
along the inframammary fold or elsewhere in the left breast to
suggest the presence of malignancy.

On physical exam of the left breast inframammary fold I do not feel
a focal area of thickening or discrete mass. There is an area of
mild skin breakdown with erythema. No open wound or drainage.

Ultrasound:

Targeted ultrasound performed along the left inframammary fold
demonstrating no cystic or solid mass or other imaging abnormality.
IMPRESSION: 1. No mammographic or sonographic evidence of malignancy in the
central inferior left breast.

2. Mild area of skin breakdown along the left inframammary fold.
Patient was encouraged to use topical ointment.

RECOMMENDATION:
1. Recommend any further workup of the palpable site in the left
breast be on a clinical basis.

2.  Screening mammogram in one year.(Code:01-7-GOB)

I have discussed the findings and recommendations with the patient.
If applicable, a reminder letter will be sent to the patient
regarding the next appointment.

BI-RADS CATEGORY  2: Benign.

## 2023-02-17 DIAGNOSIS — E860 Dehydration: Secondary | ICD-10-CM | POA: Diagnosis not present

## 2023-02-17 DIAGNOSIS — R112 Nausea with vomiting, unspecified: Secondary | ICD-10-CM | POA: Diagnosis not present

## 2023-02-17 DIAGNOSIS — R198 Other specified symptoms and signs involving the digestive system and abdomen: Secondary | ICD-10-CM | POA: Diagnosis not present

## 2023-02-17 DIAGNOSIS — K529 Noninfective gastroenteritis and colitis, unspecified: Secondary | ICD-10-CM | POA: Diagnosis not present

## 2023-02-20 DIAGNOSIS — H6122 Impacted cerumen, left ear: Secondary | ICD-10-CM | POA: Diagnosis not present

## 2023-02-20 DIAGNOSIS — Z8719 Personal history of other diseases of the digestive system: Secondary | ICD-10-CM | POA: Diagnosis not present

## 2023-02-20 DIAGNOSIS — J029 Acute pharyngitis, unspecified: Secondary | ICD-10-CM | POA: Diagnosis not present

## 2023-04-04 ENCOUNTER — Other Ambulatory Visit: Payer: Self-pay | Admitting: Internal Medicine

## 2023-04-04 DIAGNOSIS — Z1231 Encounter for screening mammogram for malignant neoplasm of breast: Secondary | ICD-10-CM

## 2023-05-01 DIAGNOSIS — F3341 Major depressive disorder, recurrent, in partial remission: Secondary | ICD-10-CM | POA: Diagnosis not present

## 2023-05-01 DIAGNOSIS — Z8639 Personal history of other endocrine, nutritional and metabolic disease: Secondary | ICD-10-CM | POA: Diagnosis not present

## 2023-05-01 DIAGNOSIS — E7800 Pure hypercholesterolemia, unspecified: Secondary | ICD-10-CM | POA: Diagnosis not present

## 2023-05-01 DIAGNOSIS — Z853 Personal history of malignant neoplasm of breast: Secondary | ICD-10-CM | POA: Diagnosis not present

## 2023-05-01 DIAGNOSIS — G4700 Insomnia, unspecified: Secondary | ICD-10-CM | POA: Diagnosis not present

## 2023-05-01 DIAGNOSIS — M85852 Other specified disorders of bone density and structure, left thigh: Secondary | ICD-10-CM | POA: Diagnosis not present

## 2023-05-01 DIAGNOSIS — E039 Hypothyroidism, unspecified: Secondary | ICD-10-CM | POA: Diagnosis not present

## 2023-05-01 DIAGNOSIS — N3281 Overactive bladder: Secondary | ICD-10-CM | POA: Diagnosis not present

## 2023-05-01 DIAGNOSIS — Z23 Encounter for immunization: Secondary | ICD-10-CM | POA: Diagnosis not present

## 2023-05-03 ENCOUNTER — Ambulatory Visit
Admission: RE | Admit: 2023-05-03 | Discharge: 2023-05-03 | Disposition: A | Discharge status: Home / Self Care | Payer: Medicare HMO | Source: Ambulatory Visit | Attending: Internal Medicine | Admitting: Internal Medicine

## 2023-05-03 DIAGNOSIS — Z1231 Encounter for screening mammogram for malignant neoplasm of breast: Secondary | ICD-10-CM

## 2023-07-03 DIAGNOSIS — M545 Low back pain, unspecified: Secondary | ICD-10-CM | POA: Diagnosis not present

## 2023-07-17 DIAGNOSIS — M545 Low back pain, unspecified: Secondary | ICD-10-CM | POA: Diagnosis not present

## 2023-07-24 DIAGNOSIS — M5416 Radiculopathy, lumbar region: Secondary | ICD-10-CM | POA: Diagnosis not present

## 2023-07-24 DIAGNOSIS — M545 Low back pain, unspecified: Secondary | ICD-10-CM | POA: Diagnosis not present

## 2023-07-31 DIAGNOSIS — M810 Age-related osteoporosis without current pathological fracture: Secondary | ICD-10-CM | POA: Diagnosis not present

## 2023-08-12 DIAGNOSIS — J0110 Acute frontal sinusitis, unspecified: Secondary | ICD-10-CM | POA: Diagnosis not present

## 2023-08-21 ENCOUNTER — Ambulatory Visit
Admission: RE | Admit: 2023-08-21 | Discharge: 2023-08-21 | Disposition: A | Discharge status: Home / Self Care | Payer: Medicare HMO | Source: Ambulatory Visit | Attending: Physician Assistant

## 2023-08-21 ENCOUNTER — Other Ambulatory Visit: Payer: Self-pay | Admitting: Physician Assistant

## 2023-08-21 DIAGNOSIS — R0989 Other specified symptoms and signs involving the circulatory and respiratory systems: Secondary | ICD-10-CM

## 2023-08-21 DIAGNOSIS — R059 Cough, unspecified: Secondary | ICD-10-CM | POA: Diagnosis not present

## 2023-08-21 DIAGNOSIS — M810 Age-related osteoporosis without current pathological fracture: Secondary | ICD-10-CM | POA: Diagnosis not present

## 2023-08-21 DIAGNOSIS — R0602 Shortness of breath: Secondary | ICD-10-CM | POA: Diagnosis not present

## 2023-08-28 ENCOUNTER — Encounter (HOSPITAL_BASED_OUTPATIENT_CLINIC_OR_DEPARTMENT_OTHER): Payer: Self-pay | Admitting: Urology

## 2023-08-28 ENCOUNTER — Emergency Department (HOSPITAL_BASED_OUTPATIENT_CLINIC_OR_DEPARTMENT_OTHER): Payer: Medicare HMO

## 2023-08-28 ENCOUNTER — Emergency Department (HOSPITAL_BASED_OUTPATIENT_CLINIC_OR_DEPARTMENT_OTHER)
Admission: EM | Admit: 2023-08-28 | Discharge: 2023-08-28 | Disposition: A | Discharge status: Home / Self Care | Payer: Medicare HMO | Attending: Emergency Medicine | Admitting: Emergency Medicine

## 2023-08-28 ENCOUNTER — Other Ambulatory Visit: Payer: Self-pay

## 2023-08-28 DIAGNOSIS — R052 Subacute cough: Secondary | ICD-10-CM | POA: Diagnosis not present

## 2023-08-28 DIAGNOSIS — M25431 Effusion, right wrist: Secondary | ICD-10-CM | POA: Diagnosis not present

## 2023-08-28 DIAGNOSIS — R059 Cough, unspecified: Secondary | ICD-10-CM | POA: Diagnosis not present

## 2023-08-28 DIAGNOSIS — R509 Fever, unspecified: Secondary | ICD-10-CM | POA: Diagnosis not present

## 2023-08-28 DIAGNOSIS — L03113 Cellulitis of right upper limb: Secondary | ICD-10-CM | POA: Diagnosis not present

## 2023-08-28 DIAGNOSIS — H6121 Impacted cerumen, right ear: Secondary | ICD-10-CM | POA: Insufficient documentation

## 2023-08-28 DIAGNOSIS — R5383 Other fatigue: Secondary | ICD-10-CM | POA: Diagnosis not present

## 2023-08-28 DIAGNOSIS — I700 Atherosclerosis of aorta: Secondary | ICD-10-CM | POA: Diagnosis not present

## 2023-08-28 DIAGNOSIS — J40 Bronchitis, not specified as acute or chronic: Secondary | ICD-10-CM | POA: Diagnosis not present

## 2023-08-28 DIAGNOSIS — M7989 Other specified soft tissue disorders: Secondary | ICD-10-CM | POA: Diagnosis not present

## 2023-08-28 DIAGNOSIS — M1811 Unilateral primary osteoarthritis of first carpometacarpal joint, right hand: Secondary | ICD-10-CM | POA: Diagnosis not present

## 2023-08-28 DIAGNOSIS — R5381 Other malaise: Secondary | ICD-10-CM | POA: Diagnosis not present

## 2023-08-28 DIAGNOSIS — M25531 Pain in right wrist: Secondary | ICD-10-CM | POA: Diagnosis not present

## 2023-08-28 LAB — CBC WITH DIFFERENTIAL/PLATELET
Abs Immature Granulocytes: 0.05 10*3/uL (ref 0.00–0.07)
Basophils Absolute: 0.1 10*3/uL (ref 0.0–0.1)
Basophils Relative: 1 %
Eosinophils Absolute: 0.1 10*3/uL (ref 0.0–0.5)
Eosinophils Relative: 1 %
HCT: 36.7 % (ref 36.0–46.0)
Hemoglobin: 12.1 g/dL (ref 12.0–15.0)
Immature Granulocytes: 0 %
Lymphocytes Relative: 10 %
Lymphs Abs: 1.2 10*3/uL (ref 0.7–4.0)
MCH: 31.3 pg (ref 26.0–34.0)
MCHC: 33 g/dL (ref 30.0–36.0)
MCV: 94.8 fL (ref 80.0–100.0)
Monocytes Absolute: 1.1 10*3/uL — ABNORMAL HIGH (ref 0.1–1.0)
Monocytes Relative: 9 %
Neutro Abs: 9.4 10*3/uL — ABNORMAL HIGH (ref 1.7–7.7)
Neutrophils Relative %: 79 %
Platelets: 284 10*3/uL (ref 150–400)
RBC: 3.87 MIL/uL (ref 3.87–5.11)
RDW: 13.1 % (ref 11.5–15.5)
WBC: 11.9 10*3/uL — ABNORMAL HIGH (ref 4.0–10.5)
nRBC: 0 % (ref 0.0–0.2)

## 2023-08-28 LAB — URINALYSIS, W/ REFLEX TO CULTURE (INFECTION SUSPECTED)
Bacteria, UA: NONE SEEN
Bilirubin Urine: NEGATIVE
Glucose, UA: NEGATIVE mg/dL
Hgb urine dipstick: NEGATIVE
Ketones, ur: 15 mg/dL — AB
Nitrite: NEGATIVE
Specific Gravity, Urine: 1.022 (ref 1.005–1.030)
pH: 6.5 (ref 5.0–8.0)

## 2023-08-28 LAB — COMPREHENSIVE METABOLIC PANEL
ALT: 9 U/L (ref 0–44)
AST: 15 U/L (ref 15–41)
Albumin: 4.4 g/dL (ref 3.5–5.0)
Alkaline Phosphatase: 60 U/L (ref 38–126)
Anion gap: 8 (ref 5–15)
BUN: 7 mg/dL — ABNORMAL LOW (ref 8–23)
CO2: 28 mmol/L (ref 22–32)
Calcium: 9.1 mg/dL (ref 8.9–10.3)
Chloride: 101 mmol/L (ref 98–111)
Creatinine, Ser: 0.65 mg/dL (ref 0.44–1.00)
GFR, Estimated: >60 mL/min (ref >60)
Glucose, Bld: 135 mg/dL — ABNORMAL HIGH (ref 70–99)
Potassium: 3.8 mmol/L (ref 3.5–5.1)
Sodium: 137 mmol/L (ref 135–145)
Total Bilirubin: 0.8 mg/dL (ref 0.3–1.2)
Total Protein: 7.4 g/dL (ref 6.5–8.1)

## 2023-08-28 LAB — TROPONIN I (HIGH SENSITIVITY): Troponin I (High Sensitivity): 4 ng/L (ref <18)

## 2023-08-28 LAB — MAGNESIUM: Magnesium: 2 mg/dL (ref 1.7–2.4)

## 2023-08-28 LAB — TSH: TSH: 1.015 u[IU]/mL (ref 0.350–4.500)

## 2023-08-28 MED ORDER — DOXYCYCLINE HYCLATE 100 MG PO CAPS
100.0000 mg | ORAL_CAPSULE | Freq: Two times a day (BID) | ORAL | 0 refills | Status: AC
Start: 2023-08-28 — End: ?

## 2023-08-28 MED ORDER — DOXYCYCLINE HYCLATE 100 MG PO TABS: 100.0000 mg | ORAL_TABLET | Freq: Once | ORAL | Status: DC

## 2023-08-28 NOTE — Discharge Instructions (Addendum)
You can take doxycycline once in the morning once in the evening for the next 10 days.  You had labs done in the emergency department that were normal.  Your chest x-ray was also normal.  At this time, I do not have a clear cause for your symptoms, but my concern is that there could be some cellulitis, or an infection of the skin on your wrist.  I would like you to follow-up with your primary care doctor within 2 days for a checkup.  Come back to the emergency department if you feel that you were getting worse, developing worsening pain in your wrist, or having difficulty with your breathing.

## 2023-08-28 NOTE — ED Triage Notes (Signed)
Pt states runny  nose and cough x 3 weeks, has been treated twice,  Has taken augmenten, z-pack and tessalon pearls  States right hand swelling that started yesterday and fever at home of 103, also states shoulder pain  No fever at this time, took Research scientist (medical)

## 2023-08-28 NOTE — ED Notes (Addendum)
Left room before additional testing could be preformed and before DC papers could be given. Seen leaving lobby of ER. Voicemail left on patient's personal phone number.

## 2023-08-28 NOTE — ED Provider Notes (Signed)
[place] EMERGENCY DEPARTMENT AT [facility] Provider Note   CSN: [id number] Arrival date & time: 08/28/23  1500     History  Chief Complaint  Patient presents with   Multiple Complaints    ELTA KAZAR is a 82 y.o. female.  This is an 82 year old female who presents emergency department today due to nasal drainage, swelling of her right hand, pain in her right shoulder, subjective weakness in her legs.  Patient says that about 3 weeks ago she was treated for "a sinus infection "by her PCP.  She said she had been having several days of nasal drainage and cough.  She was treated with antibiotics for 10 days, and then subsequently 5 more days.  She went to an urgent care today who recommended she come to the emergency department.  She reports that she had a fever earlier today.        Home Medications Prior to Admission medications   Medication Sig Start Date End Date Taking? Authorizing Provider  Calcium Citrate-Vitamin D (CALCIUM + D PO) Take 1 tablet by mouth daily.    [provider]  cetirizine (ZYRTEC) 10 MG tablet Take 10 mg by mouth daily as needed for allergies.    [provider]  Cholecalciferol (VITAMIN D-3 PO) Take 2,000 Units by mouth daily.    [provider]  citalopram (CELEXA) 20 MG tablet Take 1 tablet (20 mg total) by mouth daily. 05/02/19   Magrinat, Valentino Hue, MD  clobetasol ointment (TEMOVATE) 0.05 % Applied three times a week Patient taking differently: Apply 1 application topically daily as needed (irritation). 09/20/16   Ok Edwards, MD  docusate sodium (COLACE) 100 MG capsule Take 1 capsule (100 mg total) by mouth 2 (two) times daily as needed for mild constipation. 01/06/21   Jene Every, MD  HYDROcodone-acetaminophen (NORCO/VICODIN) 5-325 MG tablet Take 1 tablet by mouth every 4 (four) hours as needed for moderate pain (pain score 4-6). 01/08/21   Dorothy Spark, PA-C  levothyroxine (SYNTHROID) 88 MCG tablet  Take 88 mcg by mouth daily before breakfast. 02/13/19   [provider]  polyethylene glycol (MIRALAX / GLYCOLAX) 17 g packet Take 17 g by mouth daily. 01/06/21   Jene Every, MD  rivaroxaban (XARELTO) 10 MG TABS tablet Take 1 tablet (10 mg total) by mouth daily. 01/06/21   Jene Every, MD  zolpidem (AMBIEN) 10 MG tablet Take by mouth at bedtime as needed for sleep. 03/30/15   [provider]      Allergies    Tape, Wound dressing adhesive, and Hrt support [a-g pro]    Review of Systems   Review of Systems  Physical Exam Updated Vital Signs BP (!) 129/112 (BP Location: Right Arm)   Pulse 87   Temp (!) 97.5 F (36.4 C)   Resp 17   Ht 5\' 4"  (1.626 m)   Wt 84.8 kg   SpO2 95%   BMI 32.09 kg/m  Physical Exam Vitals reviewed.  Constitutional:      Appearance: Normal appearance. She is not toxic-appearing.  HENT:     Head: Normocephalic and atraumatic.     Right Ear: External ear normal. There is impacted cerumen.     Left Ear: Tympanic membrane and external ear normal.     Mouth/Throat:     Mouth: Mucous membranes are dry.     Pharynx: No oropharyngeal exudate or posterior oropharyngeal erythema.  Eyes:     Extraocular Movements: Extraocular movements intact.  Pupils: Pupils are equal, round, and reactive to light.  Cardiovascular:     Rate and Rhythm: Normal rate.     Pulses: Normal pulses.  Musculoskeletal:     Cervical back: Normal range of motion and neck supple.  Skin:    General: Skin is warm and dry.     Findings: No rash.  Neurological:     General: No focal deficit present.     Mental Status: She is alert.     Cranial Nerves: No cranial nerve deficit.     Motor: No weakness.     Gait: Gait normal.     Comments: 5/5 strength with hip flexion, knee extension, plantar and dorsi flexion.  No abnormal sensation in the bilateral lower extremities     ED Results / Procedures / Treatments   Labs (all labs ordered are listed, but only abnormal  results are displayed) Labs Reviewed - No data to display  EKG None  Radiology No results found.  Procedures Procedures    Medications Ordered in ED Medications - No data to display  ED Course/ Medical Decision Making/ A&P                                 Medical Decision Making 82 year old female here today for multiple complaints.  Differential diagnoses include viral syndrome, electrolyte abnormalities, dehydration, fatigue, hypothyroidism, drug-induced lupus.  Plan-overall the patient looks quite well.  She is afebrile here, normotensive, nontachycardic, nontachypneic.  Her symptoms of nasal discharge have improved, I suspect that it was likely viral all along.  Will obtain a chest x-ray on the patient as she continues to have cough.  Routine blood work ordered.  Regarding the patient's right wrist, there is some mild swelling and erythema of the right wrist.  She denies any trauma to the area.  She has not any swelling proximal in the arm to this.  Does not appear to be an infected joint.  Regarding the patient's leg weakness.  Considered Guillain-Barr type presentation, however the patient has no deficits on my exam, has a normal gait.  Overall, I suspect that there might be some mild lab abnormalities and patient given her recent viral illness, multiple rounds of antibiotics.  Reassessment 7 PM-my dependent review the patient's chest x-ray shows no pneumonia.  I did speak with the patient's daughter who corroborated the patient's story.  Daughter lives in [place], however there is a son who lives nearby.  Daughter is going to arrange for the son to check and or for the patient to stay with the son.  Patient borderline febrile, however urine does not show infection, only potential source would be cellulitic changes over the right wrist.  I am not convinced that this is cellulitis, however with the patient's advanced age, borderline fever, do think treating her with doxycycline  would be reasonable.  She knows to follow-up with her PCP this week.  Patient left before nursing could provide her with an oral antibiotic here.  Nursing staff did call patient, instructed to pick up her antibiotics, and read her my discharge instructions.  Amount and/or Complexity of Data Reviewed Labs: ordered. Radiology: ordered.           Final Clinical Impression(s) / ED Diagnoses Final diagnoses:  None    Rx / DC Orders ED Discharge Orders     None         Arletha Pili, DO 08/28/23  1902  

## 2023-08-30 ENCOUNTER — Emergency Department (HOSPITAL_COMMUNITY): Payer: Medicare HMO

## 2023-08-30 ENCOUNTER — Emergency Department (HOSPITAL_COMMUNITY)
Admission: EM | Admit: 2023-08-30 | Discharge: 2023-08-30 | Disposition: A | Discharge status: Home / Self Care | Payer: Medicare HMO | Attending: Emergency Medicine | Admitting: Emergency Medicine

## 2023-08-30 ENCOUNTER — Other Ambulatory Visit: Payer: Self-pay

## 2023-08-30 DIAGNOSIS — R110 Nausea: Secondary | ICD-10-CM | POA: Diagnosis not present

## 2023-08-30 DIAGNOSIS — L03113 Cellulitis of right upper limb: Secondary | ICD-10-CM | POA: Insufficient documentation

## 2023-08-30 DIAGNOSIS — E860 Dehydration: Secondary | ICD-10-CM | POA: Diagnosis not present

## 2023-08-30 DIAGNOSIS — R509 Fever, unspecified: Secondary | ICD-10-CM | POA: Diagnosis not present

## 2023-08-30 DIAGNOSIS — Z1152 Encounter for screening for COVID-19: Secondary | ICD-10-CM | POA: Diagnosis not present

## 2023-08-30 DIAGNOSIS — Z7901 Long term (current) use of anticoagulants: Secondary | ICD-10-CM | POA: Diagnosis not present

## 2023-08-30 DIAGNOSIS — R9389 Abnormal findings on diagnostic imaging of other specified body structures: Secondary | ICD-10-CM | POA: Diagnosis not present

## 2023-08-30 DIAGNOSIS — R531 Weakness: Secondary | ICD-10-CM | POA: Diagnosis not present

## 2023-08-30 DIAGNOSIS — R059 Cough, unspecified: Secondary | ICD-10-CM | POA: Diagnosis not present

## 2023-08-30 DIAGNOSIS — W19XXXA Unspecified fall, initial encounter: Secondary | ICD-10-CM | POA: Diagnosis not present

## 2023-08-30 DIAGNOSIS — R0989 Other specified symptoms and signs involving the circulatory and respiratory systems: Secondary | ICD-10-CM | POA: Diagnosis not present

## 2023-08-30 DIAGNOSIS — I6782 Cerebral ischemia: Secondary | ICD-10-CM | POA: Diagnosis not present

## 2023-08-30 LAB — COMPREHENSIVE METABOLIC PANEL
ALT: 12 U/L (ref 0–44)
AST: 13 U/L — ABNORMAL LOW (ref 15–41)
Albumin: 3.2 g/dL — ABNORMAL LOW (ref 3.5–5.0)
Alkaline Phosphatase: 59 U/L (ref 38–126)
Anion gap: 11 (ref 5–15)
BUN: 7 mg/dL — ABNORMAL LOW (ref 8–23)
CO2: 21 mmol/L — ABNORMAL LOW (ref 22–32)
Calcium: 8 mg/dL — ABNORMAL LOW (ref 8.9–10.3)
Chloride: 104 mmol/L (ref 98–111)
Creatinine, Ser: 0.69 mg/dL (ref 0.44–1.00)
GFR, Estimated: >60 mL/min (ref >60)
Glucose, Bld: 128 mg/dL — ABNORMAL HIGH (ref 70–99)
Potassium: 3.5 mmol/L (ref 3.5–5.1)
Sodium: 136 mmol/L (ref 135–145)
Total Bilirubin: 1.1 mg/dL (ref 0.3–1.2)
Total Protein: 6.7 g/dL (ref 6.5–8.1)

## 2023-08-30 LAB — URINALYSIS, ROUTINE W REFLEX MICROSCOPIC
Bilirubin Urine: NEGATIVE
Glucose, UA: NEGATIVE mg/dL
Hgb urine dipstick: NEGATIVE
Ketones, ur: 80 mg/dL — AB
Nitrite: NEGATIVE
Protein, ur: 100 mg/dL — AB
Specific Gravity, Urine: 1.021 (ref 1.005–1.030)
pH: 6 (ref 5.0–8.0)

## 2023-08-30 LAB — CBC WITH DIFFERENTIAL/PLATELET
Abs Immature Granulocytes: 0.07 10*3/uL (ref 0.00–0.07)
Basophils Absolute: 0.1 10*3/uL (ref 0.0–0.1)
Basophils Relative: 0 %
Eosinophils Absolute: 0.1 10*3/uL (ref 0.0–0.5)
Eosinophils Relative: 0 %
HCT: 33 % — ABNORMAL LOW (ref 36.0–46.0)
Hemoglobin: 10.8 g/dL — ABNORMAL LOW (ref 12.0–15.0)
Immature Granulocytes: 1 %
Lymphocytes Relative: 9 %
Lymphs Abs: 1.1 10*3/uL (ref 0.7–4.0)
MCH: 30.8 pg (ref 26.0–34.0)
MCHC: 32.7 g/dL (ref 30.0–36.0)
MCV: 94 fL (ref 80.0–100.0)
Monocytes Absolute: 1.3 10*3/uL — ABNORMAL HIGH (ref 0.1–1.0)
Monocytes Relative: 11 %
Neutro Abs: 9.1 10*3/uL — ABNORMAL HIGH (ref 1.7–7.7)
Neutrophils Relative %: 79 %
Platelets: 283 10*3/uL (ref 150–400)
RBC: 3.51 MIL/uL — ABNORMAL LOW (ref 3.87–5.11)
RDW: 13.1 % (ref 11.5–15.5)
WBC: 11.7 10*3/uL — ABNORMAL HIGH (ref 4.0–10.5)
nRBC: 0 % (ref 0.0–0.2)

## 2023-08-30 LAB — SARS CORONAVIRUS 2 BY RT PCR: SARS Coronavirus 2 by RT PCR: NEGATIVE

## 2023-08-30 LAB — I-STAT CG4 LACTIC ACID, ED: Lactic Acid, Venous: 0.7 mmol/L (ref 0.5–1.9)

## 2023-08-30 MED ORDER — FENTANYL CITRATE PF 50 MCG/ML IJ SOSY
50.0000 ug | PREFILLED_SYRINGE | Freq: Once | INTRAMUSCULAR | Status: AC
  Administered 2023-08-30: 50 ug via INTRAVENOUS
  Filled 2023-08-30: qty 1

## 2023-08-30 MED ORDER — LACTATED RINGERS IV BOLUS
1000.0000 mL | Freq: Once | INTRAVENOUS | Status: AC
  Administered 2023-08-30: 1000 mL via INTRAVENOUS

## 2023-08-30 MED ORDER — DOXYCYCLINE HYCLATE 100 MG PO TABS
100.0000 mg | ORAL_TABLET | Freq: Once | ORAL | Status: AC
  Administered 2023-08-30: 100 mg via ORAL
  Filled 2023-08-30: qty 1

## 2023-08-30 NOTE — ED Provider Notes (Signed)
[place] EMERGENCY DEPARTMENT AT [facility] Provider Note   CSN: [id number] Arrival date & time: 08/30/23  4098     History  Chief Complaint  Patient presents with   Weakness    Pt diagnosed with right hand cellulitis yesterday at another outlying acute care facility. Placed on doxy. Reports increased weakness today and pt slid out of bed. BGL 139 pta. VSS PTA.     Jessica Bautista is a 82 y.o. female.  HPI Patient presents 1 day after being seen and evaluated at our affiliated facility now with concern for weakness.  Patient was diagnosed with right hand cellulitis yesterday, started antibiotics.  Today, just prior to EMS notification she had an episode of weakness, and slid out of her bed.  She denies an actual fall, denies new pain anywhere, does have ongoing pain in the dorsum of her right hand.  No other new complaints that are focal, such as fever, weakness, confusion.  Blood glucose 139.    Home Medications Prior to Admission medications   Medication Sig Start Date End Date Taking? Authorizing Provider  Calcium Citrate-Vitamin D (CALCIUM + D PO) Take 1 tablet by mouth daily.    [provider]  cetirizine (ZYRTEC) 10 MG tablet Take 10 mg by mouth daily as needed for allergies.    [provider]  Cholecalciferol (VITAMIN D-3 PO) Take 2,000 Units by mouth daily.    [provider]  citalopram (CELEXA) 20 MG tablet Take 1 tablet (20 mg total) by mouth daily. 05/02/19   Magrinat, Valentino Hue, MD  clobetasol ointment (TEMOVATE) 0.05 % Applied three times a week Patient taking differently: Apply 1 application topically daily as needed (irritation). 09/20/16   Ok Edwards, MD  docusate sodium (COLACE) 100 MG capsule Take 1 capsule (100 mg total) by mouth 2 (two) times daily as needed for mild constipation. 01/06/21   Jene Every, MD  doxycycline (VIBRAMYCIN) 100 MG capsule Take 1 capsule (100 mg total) by mouth 2 (two) times daily. 08/28/23    Arletha Pili, DO  HYDROcodone-acetaminophen (NORCO/VICODIN) 5-325 MG tablet Take 1 tablet by mouth every 4 (four) hours as needed for moderate pain (pain score 4-6). 01/08/21   Dorothy Spark, PA-C  levothyroxine (SYNTHROID) 88 MCG tablet Take 88 mcg by mouth daily before breakfast. 02/13/19   [provider]  polyethylene glycol (MIRALAX / GLYCOLAX) 17 g packet Take 17 g by mouth daily. 01/06/21   Jene Every, MD  rivaroxaban (XARELTO) 10 MG TABS tablet Take 1 tablet (10 mg total) by mouth daily. 01/06/21   Jene Every, MD  zolpidem (AMBIEN) 10 MG tablet Take by mouth at bedtime as needed for sleep. 03/30/15   [provider]      Allergies    Tape, Wound dressing adhesive, and Hrt support [a-g pro]    Review of Systems   Review of Systems  Physical Exam Updated Vital Signs BP 122/63   Pulse (!) 58   Temp 98.2 F (36.8 C) (Oral)   Resp 16   Ht 5\' 5"  (1.651 m)   Wt 81.6 kg   SpO2 92%   BMI 29.95 kg/m  Physical Exam Vitals and nursing note reviewed.  Constitutional:      General: She is not in acute distress.    Appearance: She is well-developed. She is not ill-appearing.  HENT:     Head: Normocephalic and atraumatic.  Eyes:     Conjunctiva/sclera: Conjunctivae normal.  Cardiovascular:  Rate and Rhythm: Normal rate and regular rhythm.     Pulses: Normal pulses.  Pulmonary:     Effort: Pulmonary effort is normal. No respiratory distress.     Breath sounds: Normal breath sounds. No stridor.  Abdominal:     General: There is no distension.  Musculoskeletal:       Arms:  Skin:    General: Skin is warm and dry.  Neurological:     Mental Status: She is alert and oriented to person, place, and time.     Cranial Nerves: No cranial nerve deficit.  Psychiatric:        Mood and Affect: Mood normal.     ED Results / Procedures / Treatments   Labs (all labs ordered are listed, but only abnormal results are displayed) Labs Reviewed  COMPREHENSIVE  METABOLIC PANEL - Abnormal; Notable for the following components:      Result Value   CO2 21 (*)    Glucose, Bld 128 (*)    BUN 7 (*)    Calcium 8.0 (*)    Albumin 3.2 (*)    AST 13 (*)    All other components within normal limits  CBC WITH DIFFERENTIAL/PLATELET - Abnormal; Notable for the following components:   WBC 11.7 (*)    RBC 3.51 (*)    Hemoglobin 10.8 (*)    HCT 33.0 (*)    Neutro Abs 9.1 (*)    Monocytes Absolute 1.3 (*)    All other components within normal limits  URINALYSIS, ROUTINE W REFLEX MICROSCOPIC - Abnormal; Notable for the following components:   APPearance HAZY (*)    Ketones, ur 80 (*)    Protein, ur 100 (*)    Leukocytes,Ua SMALL (*)    Bacteria, UA RARE (*)    Non Squamous Epithelial 0-5 (*)    All other components within normal limits  SARS CORONAVIRUS 2 BY RT PCR  I-STAT CG4 LACTIC ACID, ED  I-STAT CG4 LACTIC ACID, ED    EKG EKG Interpretation Date/Time:  Wednesday August 30 2023 09:56:23 EDT Ventricular Rate:  76 PR Interval:  163 QRS Duration:  91 QT Interval:  403 QTC Calculation: 454 R Axis:   -1  Text Interpretation: Sinus rhythm Abnormal R-wave progression, early transition Confirmed by Gerhard Munch [phone]) on 08/30/2023 10:43:00 AM  Radiology CT Head Wo Contrast  Result Date: 08/30/2023 CLINICAL DATA:  Provided history: Mental status change, unknown cause. EXAM: CT HEAD WITHOUT CONTRAST TECHNIQUE: Contiguous axial images were obtained from the base of the skull through the vertex without intravenous contrast. RADIATION DOSE REDUCTION: This exam was performed according to the departmental dose-optimization program which includes automated exposure control, adjustment of the mA and/or kV according to patient size and/or use of iterative reconstruction technique. COMPARISON:  None. FINDINGS: Streak/beam hardening artifact arising from dental restoration partially obscures the posterior fossa. Within this limitation, findings are as  follows. Brain: Generalized cerebral atrophy. Patchy and ill-defined hypoattenuation within the cerebral white matter, nonspecific but compatible with mild chronic small vessel ischemic disease. There is no acute intracranial hemorrhage. No demarcated cortical infarct. No extra-axial fluid collection. No evidence of an intracranial mass. No midline shift. Vascular: No hyperdense vessel.  Atherosclerotic calcifications. Skull: No calvarial fracture or aggressive osseous lesion. Sinuses/Orbits: No mass or acute finding within the imaged orbits. Mild-to-moderate mucosal thickening within the right maxillary sinus. Mild mucosal thickening, and moderate-volume secretions, within the right sphenoid sinus. Mild mucosal thickening within the left sphenoid sinus. Mild mucosal thickening,  and small-volume fluid, scattered within bilateral ethmoid air cells. Minimal mucosal thickening within the bilateral frontal sinuses. IMPRESSION: 1. Streak/beam hardening artifact arising from dental restoration partially obscures the posterior fossa. 2. Within this limitation, no evidence of an acute intracranial abnormality. 3. Parenchymal atrophy and chronic small vessel ischemic disease. Electronically Signed   By: Jackey Loge D.O.   On: 08/30/2023 15:22   DG Chest 2 View  Result Date: 08/30/2023 CLINICAL DATA:  Three-week history of cough and runny nose with intermittent fever EXAM: CHEST - 2 VIEW COMPARISON:  Chest radiograph dated 08/28/2023 FINDINGS: Unchanged elevation of the right hemidiaphragm. Normal lung volumes. No focal consolidations. No pleural effusion or pneumothorax. The heart size and mediastinal contours are within normal limits. No acute osseous abnormality. Partially imaged IVC filter. IMPRESSION: No active cardiopulmonary disease. Electronically Signed   By: Agustin Cree M.D.   On: 08/30/2023 15:03   DG Wrist Complete Right  Result Date: 08/28/2023 CLINICAL DATA:  Wrist pain and swelling EXAM: RIGHT WRIST -  COMPLETE 3+ VIEW COMPARISON:  None Available. FINDINGS: No fracture or malalignment. Advanced arthritis at the first [facility] joint and STT interval with moderate radiocarpal degenerative change. Diffuse soft tissue swelling. IMPRESSION: No acute osseous abnormality. Degenerative changes. Diffuse soft tissue swelling. Electronically Signed   By: Jasmine Pang M.D.   On: 08/28/2023 18:57   DG Chest Portable 1 View  Result Date: 08/28/2023 CLINICAL DATA:  Cough EXAM: PORTABLE CHEST 1 VIEW COMPARISON:  08/21/2023 FINDINGS: Mild bronchitic changes. No consolidation, pleural effusion or pneumothorax. Stable cardiomediastinal silhouette with aortic atherosclerosis IMPRESSION: Mild bronchitic changes. Electronically Signed   By: Jasmine Pang M.D.   On: 08/28/2023 18:54    Procedures Procedures    Medications Ordered in ED Medications  fentaNYL (SUBLIMAZE) injection 50 mcg (50 mcg Intravenous Given 08/30/23 1417)  lactated ringers bolus 1,000 mL (1,000 mLs Intravenous New Bag/Given 08/30/23 1453)    ED Course/ Medical Decision Making/ A&P                                 Medical Decision Making Patient with recently diagnosed cellulitis presents with weakness.  With weakness sufficient enough to slide from bed, concern for electrolyte abnormalities, dehydration, bacteremia, sepsis.  Initial vital signs are reassuring, however. Patient had labs, monitoring started. Absent focal neurodeficit low suspicion for intracranial abnormality. Cardiac 75 sinus normal Pulse ox 95% room air borderline   Amount and/or Complexity of Data Reviewed External Data Reviewed: notes. Labs: ordered. Decision-making details documented in ED Course. Radiology: ordered.  Risk Prescription drug management. Decision regarding hospitalization. Diagnosis or treatment significantly limited by social determinants of health.   Update: Patient:, In no distress.  She had earlier complained of exacerbation of her typical  pain, for which she takes Vicodin.  She has received fentanyl, feels better. Findings thus far reviewed, discussed with patient and her son who is now at bedside. Patient with no lactic acidosis, negative COVID test.  She does have ketone urea, and given her description of weakness there are some suspicion for dehydration contributing to this.  She is receiving fluid resuscitation currently. Head CT unremarkable, chest x-ray unremarkable.   4:01 PM Patient awake, alert, able to stand.  I did provide assistance in getting her to the bathroom, but no distress.  Adult female presents with ongoing weakness in the context of pneumonia within the past month.  Here she is awake, alert, afebrile,  hemodynamically unremarkable.  Labs CT x-ray ECG reviewed, no evidence for acute new pathology beyond ketonuria suggestion of dehydration. She does have cellulitis on her right hand, has only taken 1 day of antibiotics thus far, this may be contributing to her weakness as well. With otherwise reassuring findings, no indication for admission, patient discharged with ongoing antibiotics, instructions on fluid resuscitation at home, to follow-up with primary care.  Home health orders placed for assistance.        Final Clinical Impression(s) / ED Diagnoses Final diagnoses:  Weakness  Cellulitis of right upper extremity  Dehydration     Gerhard Munch, MD 08/30/23 [phone]

## 2023-08-30 NOTE — Discharge Instructions (Signed)
Today's evaluation has been generally reassuring.  However, if you develop new, or concerning changes be sure to return here.  Otherwise please follow-up with your physician via telephone.  In addition, a order for home health services has been entered.  You should be contacted for an assessment in the coming days to see which services you would benefit from in your house.

## 2023-09-05 DIAGNOSIS — D72828 Other elevated white blood cell count: Secondary | ICD-10-CM | POA: Diagnosis not present

## 2023-09-05 DIAGNOSIS — L03113 Cellulitis of right upper limb: Secondary | ICD-10-CM | POA: Diagnosis not present

## 2023-09-11 DIAGNOSIS — Z86718 Personal history of other venous thrombosis and embolism: Secondary | ICD-10-CM | POA: Diagnosis not present

## 2023-09-11 DIAGNOSIS — M7989 Other specified soft tissue disorders: Secondary | ICD-10-CM | POA: Diagnosis not present

## 2023-09-11 DIAGNOSIS — I10 Essential (primary) hypertension: Secondary | ICD-10-CM | POA: Diagnosis not present

## 2023-09-11 DIAGNOSIS — Z86711 Personal history of pulmonary embolism: Secondary | ICD-10-CM | POA: Diagnosis not present

## 2023-09-12 ENCOUNTER — Encounter (HOSPITAL_COMMUNITY): Payer: Self-pay | Admitting: Internal Medicine

## 2023-09-12 ENCOUNTER — Ambulatory Visit (HOSPITAL_COMMUNITY)
Admission: RE | Admit: 2023-09-12 | Discharge: 2023-09-12 | Disposition: A | Discharge status: Home / Self Care | Payer: Medicare HMO | Source: Ambulatory Visit | Attending: Cardiovascular Disease | Admitting: Cardiovascular Disease

## 2023-09-12 ENCOUNTER — Other Ambulatory Visit (HOSPITAL_COMMUNITY): Payer: Self-pay | Admitting: Internal Medicine

## 2023-09-12 DIAGNOSIS — M79604 Pain in right leg: Secondary | ICD-10-CM | POA: Diagnosis not present

## 2023-09-12 DIAGNOSIS — M7989 Other specified soft tissue disorders: Secondary | ICD-10-CM | POA: Diagnosis not present

## 2023-09-13 ENCOUNTER — Encounter: Payer: Self-pay | Admitting: Neurology

## 2023-09-13 NOTE — Progress Notes (Signed)
Initial neurology clinic note  Jessica Bautista MRN: [id number] DOB: Feb 05, 1941  Referring provider: Thana Ates, MD  Primary care provider: Thana Ates, MD  Reason for consult:  weakness  Subjective:  This is Ms. Jessica Bautista, a 82 y.o. right-handed female with a medical history of HTN, HLD, hypothyroidism, breast cancer, remote history of PE who presents to neurology clinic with weakness. The patient is accompanied by daughter.  Patient's symptoms started in 08/2023. She had a sinus injection. She went to urgent care and got augmentin. She took it for 10 days without any improvement. She was then put on a Z pack and her antidepressant was switched to Zoloft. She then had swelling and redness and numbness in the right arm on 08/28/23. She woke up that morning to the symptoms. She had no known injury or bug bites. She went to urgent care and sent to ED (08/29/23). Per notes, they felt this was cellulitis. She was prescribed antibiotics for this. The redness and swelling improved, but she still has tingling in her hand and she does not feel she has good strength in her right hand.   The morning of 08/30/23, patient felt sick and vomited. She went back to bed. She then went to get out of bed and had no strength in her legs and slid out of bed onto the ground. She was unable to get up. She called her son, who called her son. She was taken to [facility] ED. Her cognition seemed to be off, and she seemed to be dehydrated. Per patient, son had to carry her into her house when she was discharged from the ED.  She then had a swollen right leg. There was concern for DVT, but it was a burst Baker's cyst. She is stronger now, but not near previous baseline 3 weeks ago. She is walking without assistance. She seems to be walking further every day. Her walking is limited by fatigue. She endorses stiffness in her right leg, but no tingling, burning, or numbness.   She denies back or neck pain. She denies  muscle pain. She denies changes to color of urine, particularly after activity. She denies changes to bowel or bladder.  She denies any significant weight changes. She denies any recent rashes on her skin. She denies fevers or night sweats. She does endorse more pain in the RUE at night requiring elevation. She also has joint pain at night (right wrist, right knee).  Since symptom onset, patient has been a lot less active.  Living situation: Lives alone. Has 5 steps going into house, which requires more effort since weakness started. She has 2 stories, but lives on first floor. EtOH use: 1 glass of wine per night prior to weakness, now none Restrictive diet: No Family history of neurologic disease: None  MEDICATIONS:  Outpatient Encounter Medications as of 09/20/2023  Medication Sig   Calcium Citrate-Vitamin D (CALCIUM + D PO) Take 1 tablet by mouth daily.   cetirizine (ZYRTEC) 10 MG tablet Take 10 mg by mouth daily as needed for allergies.   Cholecalciferol (VITAMIN D-3 PO) Take 2,000 Units by mouth daily.   clobetasol ointment (TEMOVATE) 0.05 % Applied three times a week (Patient taking differently: Apply 1 application  topically daily as needed (irritation).)   docusate sodium (COLACE) 100 MG capsule Take 1 capsule (100 mg total) by mouth 2 (two) times daily as needed for mild constipation.   levothyroxine (SYNTHROID) 88 MCG tablet Take 88 mcg by mouth  daily before breakfast.   polyethylene glycol (MIRALAX / GLYCOLAX) 17 g packet Take 17 g by mouth daily.   sertraline (ZOLOFT) 50 MG tablet Take 50 mg by mouth daily.   zolpidem (AMBIEN) 10 MG tablet Take by mouth at bedtime as needed for sleep.   [DISCONTINUED] rivaroxaban (XARELTO) 10 MG TABS tablet Take 1 tablet (10 mg total) by mouth daily.   doxycycline (VIBRAMYCIN) 100 MG capsule Take 1 capsule (100 mg total) by mouth 2 (two) times daily. (Patient not taking: Reported on 09/20/2023)   HYDROcodone-acetaminophen (NORCO/VICODIN) 5-325 MG  tablet Take 1 tablet by mouth every 4 (four) hours as needed for moderate pain (pain score 4-6). (Patient not taking: Reported on 09/20/2023)   [DISCONTINUED] citalopram (CELEXA) 20 MG tablet Take 1 tablet (20 mg total) by mouth daily. (Patient not taking: Reported on 09/20/2023)   No facility-administered encounter medications on file as of 09/20/2023.    PAST MEDICAL HISTORY: Past Medical History:  Diagnosis Date   Atypical nevi    Basal cell carcinoma    Breast cancer (HCC)    DVT (deep venous thrombosis) (HCC)    Eczema    Family history of colonic polyps    Fatty (change of) liver, not elsewhere classified    Hyperlipidemia    Hypertension    Hypothyroidism    Impaired fasting glucose    Insomnia    Lichen sclerosus et atrophicus of the vulva    Major depression, single episode    Nontoxic single thyroid nodule    Osteopenia    Psoriasis    Pulmonary embolus (HCC) 1999   post op tummy tuc   Skin cancer    Thyroid disease    Vitamin D deficiency, unspecified    Wears glasses     PAST SURGICAL HISTORY: Past Surgical History:  Procedure Laterality Date   ABDOMINOPLASTY  1998   ANKLE FRACTURE SURGERY  1998   right   BREAST LUMPECTOMY Right 01/27/2014   malignant   BREAST LUMPECTOMY WITH NEEDLE LOCALIZATION Right 01/27/2014   Procedure: BREAST LUMPECTOMY WITH NEEDLE LOCALIZATION;  Surgeon: Mariella Saa, MD;  Location: [facility];  Service: General;  Laterality: Right;   BREAST SURGERY     COLONOSCOPY     EYE SURGERY Bilateral 2012   TOTAL KNEE ARTHROPLASTY Left 01/06/2021   Procedure: TOTAL KNEE ARTHROPLASTY;  Surgeon: Jene Every, MD;  Location: [facility];  Service: Orthopedics;  Laterality: Left;  2.5 hrs   TUBAL LIGATION     WRIST FRACTURE SURGERY  1975   right    ALLERGIES: Allergies  Allergen Reactions   Tape Itching and Rash    Adhesive Tape   Wound Dressing Adhesive Itching   Hrt Support [A-G Pro]     History of pe and dvt     FAMILY HISTORY: Family History  Problem Relation Age of Onset   Alzheimer's disease Mother    Colon polyps Mother    Heart attack Father 2   Cancer Brother        LUNG- SMOKER    Healthy Sister    Heart attack Brother    Dementia Brother    Healthy Sister     SOCIAL HISTORY: Social History   Tobacco Use   Smoking status: Former    Current packs/day: 0.00    Average packs/day: 1 pack/day for 15.0 years (15.0 ttl pk-yrs)    Types: Cigarettes    Start date: 01/23/1968    Quit date: 01/23/1983  Years since quitting: 40.6   Smokeless tobacco: Never  Vaping Use   Vaping status: Never Used  Substance Use Topics   Alcohol use: Yes    Alcohol/week: 3.0 standard drinks of alcohol    Types: 3 Glasses of wine per week    Comment: occas   Drug use: No   Social History   Social History Narrative   Are you right handed or left handed? right   Are you currently employed ?    What is your current occupation?retired   Do you live at home alone?yes   Who lives with you?    What type of home do you live in: 1 story or 2 story? Two but lives on first    Caffeine 1 cup daily     Objective:  Vital Signs:  BP 123/63   Pulse 77   Ht 5\' 5"  (1.651 m)   Wt 175 lb (79.4 kg)   SpO2 98%   BMI 29.12 kg/m   General: General appearance: Awake and alert. No distress. Cooperative with exam.  Skin: No obvious rash or jaundice.  HEENT: Atraumatic. Anicteric. Lungs: Non-labored breathing on room air  Extremities:  Swelling and mildly dark discoloration of distal right forearm and hand; mild edema in RLE Psych: Affect appropriate.  Neurological: Mental Status: Alert. Speech fluent. No pseudobulbar affect Cranial Nerves: CNII: No RAPD. Visual fields intact. CNIII, IV, VI: PERRL. No nystagmus. EOMI. CN V: Facial sensation intact bilaterally to fine touch. CN VII: Facial muscles symmetric and strong. No ptosis at rest. CN VIII: Hears finger rub well bilaterally. CN IX: No  hypophonia. CN X: Palate elevates symmetrically. CN XI: Full strength shoulder shrug bilaterally. CN XII: Tongue protrusion full and midline. No atrophy or fasciculations. No significant dysarthria Motor: Tone is normal  Individual muscle group testing (MRC grade out of 5): Guarding in RUE limiting testing.  Movement     Neck flexion 5    Neck extension 5     Right Left   Shoulder abduction 5- 5   Shoulder adduction 5 5   Shoulder ext rotation 5 5   Shoulder int rotation 5 5   Elbow flexion 5 5   Elbow extension 5 5   Wrist extension 5- 5   Wrist flexion 5- 5   Finger abduction - FDI 5- 5   Finger abduction - ADM 5- 5   Finger extension 5- 5   Finger distal flexion - 2/3 5- 5   Finger distal flexion - 4/5 5- 5   Thumb flexion - FPL 5- 5   Thumb abduction - APB 5 5    Hip flexion 4+ (give way/guarding) 5-   Hip extension 5 5   Hip adduction 5 5   Hip abduction 5 5   Knee extension 5 5   Knee flexion 5 5   Dorsiflexion 5 5   Plantarflexion 5 5     Reflexes:  Right Left   Bicep 2+ 2+   Tricep 2+ 2+   BrRad 2+ 2+   Knee 2+ 2+   Ankle 1+ 1+    Pathological Reflexes: Babinski: flexor response bilaterally Hoffman: absent bilaterally Troemner: absent bilaterally Sensation: Pinprick: Intact in all extremities Coordination: Intact finger-to- nose-finger bilaterally. Romberg negative. Gait: Unable to rise from chair with arms crossed unassisted. Narrow based. Walks with right leg stiff  Labs and Imaging review: Internal labs: 08/30/23: CMP significant for glucose 128, Ca 8.0, albumin 3.2 CBC significant for chronic anemia (Hb  10.8), WBC 11.7, neutrophilic predominance, MCB 94.0  08/28/23: TSH wnl  Imaging: CT head wo contrast (08/30/23): FINDINGS: Streak/beam hardening artifact arising from dental restoration partially obscures the posterior fossa. Within this limitation, findings are as follows.   Brain:   Generalized cerebral atrophy.   Patchy and  ill-defined hypoattenuation within the cerebral white matter, nonspecific but compatible with mild chronic small vessel ischemic disease.   There is no acute intracranial hemorrhage.   No demarcated cortical infarct.   No extra-axial fluid collection.   No evidence of an intracranial mass.   No midline shift.   Vascular: No hyperdense vessel.  Atherosclerotic calcifications.   Skull: No calvarial fracture or aggressive osseous lesion.   Sinuses/Orbits: No mass or acute finding within the imaged orbits. Mild-to-moderate mucosal thickening within the right maxillary sinus. Mild mucosal thickening, and moderate-volume secretions, within the right sphenoid sinus. Mild mucosal thickening within the left sphenoid sinus. Mild mucosal thickening, and small-volume fluid, scattered within bilateral ethmoid air cells. Minimal mucosal thickening within the bilateral frontal sinuses.   IMPRESSION: 1. Streak/beam hardening artifact arising from dental restoration partially obscures the posterior fossa. 2. Within this limitation, no evidence of an acute intracranial abnormality. 3. Parenchymal atrophy and chronic small vessel ischemic disease.  Assessment/Plan:  Jessica Bautista is a 82 y.o. female who presents for evaluation of weakness, particularly of RUE and RLE. She has a relevant medical history of HTN, HLD, hypothyroidism, breast cancer, remote history of PE. Her neurological examination is pertinent for pain limited weakness of RUE and RLE. Available diagnostic data is significant for CT head without acute process. Patient's symptoms started after a sinus infection, then subsequent ?cellulitis of RUE, then ruptured Baker's cyst in right knee. There are no clear neurologic deficits to suspect a neurologic etiology. More likely, patient has had multiple infectious and MSK insults that has resulted in pain, limiting activity and motion, that then contributed to weakness through deconditioning.  She does appear to be slowly improving, which is reassuring. I will get labs to look for other potential etiologies. I also recommend PT and monitoring response. This is complicated by patient leaving for [place] to stay with daughter for a few months (leaving next week). She will attempt to find PT in [place] and will follow up with me when she returns to assess response.  PLAN: -Blood work: B1, B12. CK -Recommend PT. Patient is going to [place] with daughter for a few months.  -Return to clinic in 2-3 months  The impression above as well as the plan as outlined below were extensively discussed with the patient (in the company of daughter) who voiced understanding. All questions were answered to their satisfaction.  The patient was counseled on pertinent fall precautions per the printed material provided today, and as noted under the "Patient Instructions" section below.  When available, results of the above investigations and possible further recommendations will be communicated to the patient via telephone/MyChart. Patient to call office if not contacted after expected testing turnaround time.   Total time spent reviewing records, interview, history/exam, documentation, and coordination of care on day of encounter:  75 min   Thank you for allowing me to participate in patient's care.  If I can answer any additional questions, I would be pleased to do so.  Jacquelyne Balint, MD   CC: Thana Ates, MD [address] [place] [address]  CC: Referring provider: Thana Ates, MD [address] [place],  [place] [address]

## 2023-09-20 ENCOUNTER — Encounter: Payer: Self-pay | Admitting: Neurology

## 2023-09-20 ENCOUNTER — Other Ambulatory Visit (INDEPENDENT_AMBULATORY_CARE_PROVIDER_SITE_OTHER): Payer: Medicare HMO

## 2023-09-20 ENCOUNTER — Ambulatory Visit: Payer: Medicare HMO | Admitting: Neurology

## 2023-09-20 VITALS — BP 123/63 | HR 77 | Ht 65.0 in | Wt 175.0 lb

## 2023-09-20 DIAGNOSIS — W19XXXA Unspecified fall, initial encounter: Secondary | ICD-10-CM

## 2023-09-20 DIAGNOSIS — M7121 Synovial cyst of popliteal space [Baker], right knee: Secondary | ICD-10-CM

## 2023-09-20 DIAGNOSIS — L03113 Cellulitis of right upper limb: Secondary | ICD-10-CM

## 2023-09-20 DIAGNOSIS — R531 Weakness: Secondary | ICD-10-CM

## 2023-09-20 LAB — CK: Total CK: 32 U/L (ref 7–177)

## 2023-09-20 LAB — VITAMIN B12: Vitamin B-12: 326 pg/mL (ref 211–911)

## 2023-09-20 NOTE — Patient Instructions (Addendum)
I saw you today for weakness since an infection in October. I do not see clear evidence of a neurologic problem causing symptoms. Your right arm swelling is clearly affecting your right arm function due to pain and your right knee is stiff due to Baker's cyst.  Otherwise, I think you are deconditioned from all of these recent issues. I recommend physical therapy to see if we can get you stronger.  I will get blood work today to double check for other potential causes. I will be in touch when I have the results.  You are going to [place], so if you can find physical therapy there and need an order that they will accept from me, please let me know.  I will see you back in clinic when you return for [place] to check your symptoms (~2-3 months).  Please let me know if you have any questions or concerns in the meantime.   The physicians and staff at [facility] are committed to providing excellent care. You may receive a survey requesting feedback about your experience at our office. We strive to receive "very good" responses to the survey questions. If you feel that your experience would prevent you from giving the office a "very good " response, please contact our office to try to remedy the situation. We may be reached at [phone]. Thank you for taking the time out of your busy day to complete the survey.   Jacquelyne Balint, MD [facility]  Preventing Falls at [facility] are common, often dreaded events in the lives of older people. Aside from the obvious injuries and even death that may result, fall can cause wide-ranging consequences including loss of independence, mental decline, decreased activity and mobility. Younger people are also at risk of falling, especially those with chronic illnesses and fatigue.  Ways to reduce risk for falling Examine diet and medications. Warm foods and alcohol dilate blood vessels, which can lead to dizziness when standing. Sleep aids,  antidepressants and pain medications can also increase the likelihood of a fall.  Get a vision exam. Poor vision, cataracts and glaucoma increase the chances of falling.  Check foot gear. Shoes should fit snugly and have a sturdy, nonskid sole and a broad, low heel  Participate in a physician-approved exercise program to build and maintain muscle strength and improve balance and coordination. Programs that use ankle weights or stretch bands are excellent for muscle-strengthening. Water aerobics programs and low-impact Tai Chi programs have also been shown to improve balance and coordination.  Increase vitamin D intake. Vitamin D improves muscle strength and increases the amount of calcium the body is able to absorb and deposit in bones.  How to prevent falls from common hazards Floors - Remove all loose wires, cords, and throw rugs. Minimize clutter. Make sure rugs are anchored and smooth. Keep furniture in its usual place.  Chairs -- Use chairs with straight backs, armrests and firm seats. Add firm cushions to existing pieces to add height.  Bathroom - Install grab bars and non-skid tape in the tub or shower. Use a bathtub transfer bench or a shower chair with a back support Use an elevated toilet seat and/or safety rails to assist standing from a low surface. Do not use towel racks or bathroom tissue holders to help you stand.  Lighting - Make sure halls, stairways, and entrances are well-lit. Install a night light in your bathroom or hallway. Make sure there is a light switch at the top and bottom  of the staircase. Turn lights on if you get up in the middle of the night. Make sure lamps or light switches are within reach of the bed if you have to get up during the night.  Kitchen - Install non-skid rubber mats near the sink and stove. Clean spills immediately. Store frequently used utensils, pots, pans between waist and eye level. This helps prevent reaching and bending. Sit when getting things  out of lower cupboards.  Living room/ Bedrooms - Place furniture with wide spaces in between, giving enough room to move around. Establish a route through the living room that gives you something to hold onto as you walk.  Stairs - Make sure treads, rails, and rugs are secure. Install a rail on both sides of the stairs. If stairs are a threat, it might be helpful to arrange most of your activities on the lower level to reduce the number of times you must climb the stairs.  Entrances and doorways - Install metal handles on the walls adjacent to the doorknobs of all doors to make it more secure as you travel through the doorway.  Tips for maintaining balance Keep at least one hand free at all times. Try using a backpack or fanny pack to hold things rather than carrying them in your hands. Never carry objects in both hands when walking as this interferes with keeping your balance.  Attempt to swing both arms from front to back while walking. This might require a conscious effort if Parkinson's disease has diminished your movement. It will, however, help you to maintain balance and posture, and reduce fatigue.  Consciously lift your feet off of the ground when walking. Shuffling and dragging of the feet is a common culprit in losing your balance.  When trying to navigate turns, use a "U" technique of facing forward and making a wide turn, rather than pivoting sharply.  Try to stand with your feet shoulder-length apart. When your feet are close together for any length of time, you increase your risk of losing your balance and falling.  Do one thing at a time. Don't try to walk and accomplish another task, such as reading or looking around. The decrease in your automatic reflexes complicates motor function, so the less distraction, the better.  Do not wear rubber or gripping soled shoes, they might "catch" on the floor and cause tripping.  Move slowly when changing positions. Use deliberate,  concentrated movements and, if needed, use a grab bar or walking aid. Count 15 seconds between each movement. For example, when rising from a seated position, wait 15 seconds after standing to begin walking.  If balance is a continuous problem, you might want to consider a walking aid such as a cane, walking stick, or walker. Once you've mastered walking with help, you might be ready to try it on your own again.

## 2023-09-22 DIAGNOSIS — M660 Rupture of popliteal cyst: Secondary | ICD-10-CM | POA: Diagnosis not present

## 2023-09-22 DIAGNOSIS — M778 Other enthesopathies, not elsewhere classified: Secondary | ICD-10-CM | POA: Diagnosis not present

## 2023-09-24 LAB — VITAMIN B1: Vitamin B1 (Thiamine): 6 nmol/L — ABNORMAL LOW (ref 8–30)

## 2023-09-25 ENCOUNTER — Encounter: Payer: Self-pay | Admitting: Neurology

## 2023-10-09 DIAGNOSIS — M25561 Pain in right knee: Secondary | ICD-10-CM | POA: Diagnosis not present

## 2023-10-09 DIAGNOSIS — M25562 Pain in left knee: Secondary | ICD-10-CM | POA: Diagnosis not present

## 2023-10-09 DIAGNOSIS — M1711 Unilateral primary osteoarthritis, right knee: Secondary | ICD-10-CM | POA: Diagnosis not present

## 2023-10-12 ENCOUNTER — Telehealth: Payer: Self-pay

## 2023-10-12 NOTE — Telephone Encounter (Signed)
-----   Message from Antony Madura sent at 10/12/2023 12:00 PM EST ----- Regarding: Lab results Jessica Bautista,  Can you call patient about labs? She did not read this MyChart message I sent her:  "There are a couple of abnormalities on the labs we checked: 1. Your B1 (aka thiamine) is low. I recommend supplementing with B1 (aka thiamine) 100 mg every day. This vitamin is very important and if low for too long can sometimes cause irreversible problems with your nervous system or heart. It is very important to start this supplement. 2. Your B12 was borderline low. Given your symptoms, I would recommend supplementing with B12 1000 mcg daily.    Both of these supplements can be bought over the counter at any local drug store or online.    Please let me know if you have any questions or concerns."  Thank you,  Riki Rusk

## 2023-10-12 NOTE — Telephone Encounter (Signed)
Called Pt and left message to call office for lab results.

## 2023-10-13 ENCOUNTER — Telehealth: Payer: Self-pay

## 2023-10-13 NOTE — Telephone Encounter (Signed)
-----   Message from Antony Madura sent at 10/12/2023 12:00 PM EST ----- Regarding: Lab results Jessica Bautista,  Can you call patient about labs? She did not read this MyChart message I sent her:  "There are a couple of abnormalities on the labs we checked: 1. Your B1 (aka thiamine) is low. I recommend supplementing with B1 (aka thiamine) 100 mg every day. This vitamin is very important and if low for too long can sometimes cause irreversible problems with your nervous system or heart. It is very important to start this supplement. 2. Your B12 was borderline low. Given your symptoms, I would recommend supplementing with B12 1000 mcg daily.    Both of these supplements can be bought over the counter at any local drug store or online.    Please let me know if you have any questions or concerns."  Thank you,  Riki Rusk

## 2023-10-13 NOTE — Telephone Encounter (Signed)
Called Pt and left a detailed message on voice mail. Will try and get in touch again to make sure she received the results.

## 2023-10-18 DIAGNOSIS — G5602 Carpal tunnel syndrome, left upper limb: Secondary | ICD-10-CM | POA: Diagnosis not present

## 2023-10-18 DIAGNOSIS — G5601 Carpal tunnel syndrome, right upper limb: Secondary | ICD-10-CM | POA: Diagnosis not present

## 2023-10-19 ENCOUNTER — Telehealth: Payer: Self-pay

## 2023-10-19 NOTE — Telephone Encounter (Signed)
Letter sent out due to unable to speak with patient.

## 2023-10-20 DIAGNOSIS — F339 Major depressive disorder, recurrent, unspecified: Secondary | ICD-10-CM | POA: Diagnosis not present

## 2023-10-20 DIAGNOSIS — G5601 Carpal tunnel syndrome, right upper limb: Secondary | ICD-10-CM | POA: Diagnosis not present

## 2023-11-27 DIAGNOSIS — M1711 Unilateral primary osteoarthritis, right knee: Secondary | ICD-10-CM | POA: Diagnosis not present

## 2023-12-18 DIAGNOSIS — H524 Presbyopia: Secondary | ICD-10-CM | POA: Diagnosis not present

## 2023-12-18 DIAGNOSIS — H5211 Myopia, right eye: Secondary | ICD-10-CM | POA: Diagnosis not present

## 2023-12-18 DIAGNOSIS — H52223 Regular astigmatism, bilateral: Secondary | ICD-10-CM | POA: Diagnosis not present

## 2023-12-18 DIAGNOSIS — H5202 Hypermetropia, left eye: Secondary | ICD-10-CM | POA: Diagnosis not present

## 2024-01-01 DIAGNOSIS — D2272 Melanocytic nevi of left lower limb, including hip: Secondary | ICD-10-CM | POA: Diagnosis not present

## 2024-01-01 DIAGNOSIS — D2222 Melanocytic nevi of left ear and external auricular canal: Secondary | ICD-10-CM | POA: Diagnosis not present

## 2024-01-01 DIAGNOSIS — L400 Psoriasis vulgaris: Secondary | ICD-10-CM | POA: Diagnosis not present

## 2024-01-01 DIAGNOSIS — D692 Other nonthrombocytopenic purpura: Secondary | ICD-10-CM | POA: Diagnosis not present

## 2024-01-01 DIAGNOSIS — M1811 Unilateral primary osteoarthritis of first carpometacarpal joint, right hand: Secondary | ICD-10-CM | POA: Diagnosis not present

## 2024-01-01 DIAGNOSIS — Z85828 Personal history of other malignant neoplasm of skin: Secondary | ICD-10-CM | POA: Diagnosis not present

## 2024-01-01 DIAGNOSIS — D225 Melanocytic nevi of trunk: Secondary | ICD-10-CM | POA: Diagnosis not present

## 2024-01-01 DIAGNOSIS — D2271 Melanocytic nevi of right lower limb, including hip: Secondary | ICD-10-CM | POA: Diagnosis not present

## 2024-01-01 DIAGNOSIS — D1801 Hemangioma of skin and subcutaneous tissue: Secondary | ICD-10-CM | POA: Diagnosis not present

## 2024-01-01 DIAGNOSIS — L720 Epidermal cyst: Secondary | ICD-10-CM | POA: Diagnosis not present

## 2024-01-01 DIAGNOSIS — G5601 Carpal tunnel syndrome, right upper limb: Secondary | ICD-10-CM | POA: Diagnosis not present

## 2024-01-01 DIAGNOSIS — D2261 Melanocytic nevi of right upper limb, including shoulder: Secondary | ICD-10-CM | POA: Diagnosis not present

## 2024-01-01 DIAGNOSIS — L821 Other seborrheic keratosis: Secondary | ICD-10-CM | POA: Diagnosis not present

## 2024-01-17 DIAGNOSIS — E7800 Pure hypercholesterolemia, unspecified: Secondary | ICD-10-CM | POA: Diagnosis not present

## 2024-01-17 DIAGNOSIS — Z79899 Other long term (current) drug therapy: Secondary | ICD-10-CM | POA: Diagnosis not present

## 2024-01-17 DIAGNOSIS — E039 Hypothyroidism, unspecified: Secondary | ICD-10-CM | POA: Diagnosis not present

## 2024-01-17 DIAGNOSIS — M85852 Other specified disorders of bone density and structure, left thigh: Secondary | ICD-10-CM | POA: Diagnosis not present

## 2024-02-26 DIAGNOSIS — M810 Age-related osteoporosis without current pathological fracture: Secondary | ICD-10-CM | POA: Diagnosis not present

## 2024-02-28 DIAGNOSIS — G5601 Carpal tunnel syndrome, right upper limb: Secondary | ICD-10-CM | POA: Diagnosis not present

## 2024-02-28 DIAGNOSIS — M1811 Unilateral primary osteoarthritis of first carpometacarpal joint, right hand: Secondary | ICD-10-CM | POA: Diagnosis not present

## 2024-03-05 DIAGNOSIS — G5601 Carpal tunnel syndrome, right upper limb: Secondary | ICD-10-CM | POA: Diagnosis not present

## 2024-03-05 DIAGNOSIS — G5621 Lesion of ulnar nerve, right upper limb: Secondary | ICD-10-CM | POA: Diagnosis not present

## 2024-03-13 DIAGNOSIS — G5601 Carpal tunnel syndrome, right upper limb: Secondary | ICD-10-CM | POA: Diagnosis not present

## 2024-03-14 DIAGNOSIS — M791 Myalgia, unspecified site: Secondary | ICD-10-CM | POA: Diagnosis not present

## 2024-03-14 DIAGNOSIS — M545 Low back pain, unspecified: Secondary | ICD-10-CM | POA: Diagnosis not present

## 2024-03-14 DIAGNOSIS — M1711 Unilateral primary osteoarthritis, right knee: Secondary | ICD-10-CM | POA: Diagnosis not present

## 2024-04-02 DIAGNOSIS — G5601 Carpal tunnel syndrome, right upper limb: Secondary | ICD-10-CM | POA: Diagnosis not present

## 2024-04-15 ENCOUNTER — Other Ambulatory Visit: Payer: Self-pay | Admitting: Internal Medicine

## 2024-04-15 DIAGNOSIS — Z1231 Encounter for screening mammogram for malignant neoplasm of breast: Secondary | ICD-10-CM

## 2024-04-22 DIAGNOSIS — Z853 Personal history of malignant neoplasm of breast: Secondary | ICD-10-CM | POA: Diagnosis not present

## 2024-04-22 DIAGNOSIS — Z01419 Encounter for gynecological examination (general) (routine) without abnormal findings: Secondary | ICD-10-CM | POA: Diagnosis not present

## 2024-05-03 ENCOUNTER — Ambulatory Visit

## 2024-05-16 ENCOUNTER — Telehealth: Payer: Self-pay | Admitting: Pharmacist

## 2024-05-16 NOTE — Progress Notes (Signed)
   05/16/2024  Patient ID: Jessica Bautista, female   DOB: 01-28-1941, 83 y.o.   MRN: [id number]  Patient appeared on insurance report for at-risk for failing 2025 metric: Medication Adherence for Cholesterol (MAC)   Medication: Rosuvastatin 5mg  Last fill date: 11/29/23  Fail date: 05/05/24  Patient has not been taking the medication due to refill being denied for being too soon when requested. Therefore, needs refills sent to the pharmacy. Will have been out of the medication for 2 months now.  Patient will fail adherence metric for Rosuvastatin for 2025.    Aloysius Lewis, PharmD [facility]  Phone Number: [phone]

## 2024-05-31 ENCOUNTER — Ambulatory Visit
Admission: RE | Admit: 2024-05-31 | Discharge: 2024-05-31 | Disposition: A | Discharge status: Home / Self Care | Source: Ambulatory Visit | Attending: Internal Medicine | Admitting: Internal Medicine

## 2024-05-31 DIAGNOSIS — Z1231 Encounter for screening mammogram for malignant neoplasm of breast: Secondary | ICD-10-CM

## 2024-11-28 ENCOUNTER — Encounter (HOSPITAL_COMMUNITY): Payer: Self-pay | Admitting: Emergency Medicine

## 2024-11-28 ENCOUNTER — Emergency Department (HOSPITAL_COMMUNITY)
Admission: EM | Admit: 2024-11-28 | Discharge: 2024-11-29 | Discharge status: Against Medical Advice | Attending: Emergency Medicine | Admitting: Emergency Medicine

## 2024-11-28 ENCOUNTER — Emergency Department (HOSPITAL_COMMUNITY)

## 2024-11-28 ENCOUNTER — Other Ambulatory Visit: Payer: Self-pay

## 2024-11-28 DIAGNOSIS — R42 Dizziness and giddiness: Secondary | ICD-10-CM | POA: Insufficient documentation

## 2024-11-28 DIAGNOSIS — I10 Essential (primary) hypertension: Secondary | ICD-10-CM | POA: Insufficient documentation

## 2024-11-28 LAB — CBC WITH DIFFERENTIAL/PLATELET
Abs Immature Granulocytes: 0.02 K/uL (ref 0.00–0.07)
Basophils Absolute: 0.1 K/uL (ref 0.0–0.1)
Basophils Relative: 1 %
Eosinophils Absolute: 0.4 K/uL (ref 0.0–0.5)
Eosinophils Relative: 7 %
HCT: 36.7 % (ref 36.0–46.0)
Hemoglobin: 11.8 g/dL — ABNORMAL LOW (ref 12.0–15.0)
Immature Granulocytes: 0 %
Lymphocytes Relative: 29 %
Lymphs Abs: 1.8 K/uL (ref 0.7–4.0)
MCH: 30.7 pg (ref 26.0–34.0)
MCHC: 32.2 g/dL (ref 30.0–36.0)
MCV: 95.6 fL (ref 80.0–100.0)
Monocytes Absolute: 0.4 K/uL (ref 0.1–1.0)
Monocytes Relative: 7 %
Neutro Abs: 3.5 K/uL (ref 1.7–7.7)
Neutrophils Relative %: 56 %
Platelets: 260 K/uL (ref 150–400)
RBC: 3.84 MIL/uL — ABNORMAL LOW (ref 3.87–5.11)
RDW: 13.5 % (ref 11.5–15.5)
WBC: 6.2 K/uL (ref 4.0–10.5)
nRBC: 0 % (ref 0.0–0.2)

## 2024-11-28 LAB — COMPREHENSIVE METABOLIC PANEL WITH GFR
ALT: 8 U/L (ref 0–44)
AST: 21 U/L (ref 15–41)
Albumin: 4.2 g/dL (ref 3.5–5.0)
Alkaline Phosphatase: 52 U/L (ref 38–126)
Anion gap: 14 (ref 5–15)
BUN: 11 mg/dL (ref 8–23)
CO2: 21 mmol/L — ABNORMAL LOW (ref 22–32)
Calcium: 9.3 mg/dL (ref 8.9–10.3)
Chloride: 106 mmol/L (ref 98–111)
Creatinine, Ser: 0.54 mg/dL (ref 0.44–1.00)
GFR, Estimated: >60 mL/min
Glucose, Bld: 89 mg/dL (ref 70–99)
Potassium: 3.9 mmol/L (ref 3.5–5.1)
Sodium: 141 mmol/L (ref 135–145)
Total Bilirubin: 0.2 mg/dL (ref 0.0–1.2)
Total Protein: 7.1 g/dL (ref 6.5–8.1)

## 2024-11-28 NOTE — ED Triage Notes (Addendum)
 Patient arrives via [facility] from home for hypertension and visual changes for 2 weeks. Patient describes these vision changes as hard to focus and reports some intermittent dizziness. Hypertensive with ems with SBP in the 200's - no history of hypertension. Stroke screen negative with ems.    BP 218/68, HR 64, SPO2 97% CBG 103

## 2024-11-28 NOTE — ED Provider Triage Note (Signed)
 Emergency Medicine Provider Triage Evaluation Note  Jessica Bautista , a 84 y.o. female  was evaluated in triage.  Patient complains of visual changes.  Reports that she has had blurry vision for the past 2 weeks.  Reports particularly difficulty focusing on faces.  Denies any fever, chills, chest pain, shortness of breath, nausea, vomiting, diarrhea.  Reports that she does see her PCP annually, most recently saw them last year, has another appointment in March of this year.  Reports that she does not have a known history of hypertension and is not prescribed any antihypertensives.  Review of Systems  Positive: Per above Negative: Or above  Physical Exam  BP (!) 172/64 (BP Location: Left Arm)   Pulse 60   Temp 98.4 F (36.9 C) (Oral)   Resp 18   SpO2 100%  Gen:   Awake, no distress   Resp:  Normal effort  MSK:   Moves extremities without difficulty  Other:  Snellen chart evaluation 20/40 OU, 20/50 OS, 20/30 OD  Medical Decision Making  Medically screening exam initiated at 9:14 PM.  Appropriate orders placed.  Jessica Bautista was informed that the remainder of the evaluation will be completed by another provider, this initial triage assessment does not replace that evaluation, and the importance of remaining in the ED until their evaluation is complete.    Jessica Jerilynn GORMAN, MD 11/28/24 [id number]

## 2024-11-29 NOTE — ED Notes (Signed)
 Pt leaving due to wait time.

## 2024-12-09 ENCOUNTER — Other Ambulatory Visit: Payer: Self-pay | Admitting: Physician Assistant

## 2024-12-09 DIAGNOSIS — H539 Unspecified visual disturbance: Secondary | ICD-10-CM

## 2025-01-07 ENCOUNTER — Other Ambulatory Visit
